# Patient Record
Sex: Female | Born: 1935
Health system: Southern US, Community
[De-identification: ages and names within clinical notes are randomized; demographics above are authoritative.]

## PROBLEM LIST (undated history)

## (undated) DIAGNOSIS — I10 Essential (primary) hypertension: Secondary | ICD-10-CM

## (undated) DIAGNOSIS — R262 Difficulty in walking, not elsewhere classified: Secondary | ICD-10-CM

## (undated) DIAGNOSIS — N189 Chronic kidney disease, unspecified: Secondary | ICD-10-CM

## (undated) DIAGNOSIS — I213 ST elevation (STEMI) myocardial infarction of unspecified site: Secondary | ICD-10-CM

## (undated) DIAGNOSIS — I509 Heart failure, unspecified: Secondary | ICD-10-CM

## (undated) DIAGNOSIS — N3281 Overactive bladder: Secondary | ICD-10-CM

## (undated) DIAGNOSIS — E785 Hyperlipidemia, unspecified: Secondary | ICD-10-CM

## (undated) DIAGNOSIS — E119 Type 2 diabetes mellitus without complications: Secondary | ICD-10-CM

## (undated) HISTORY — PX: TONSILLECTOMY: SUR1361

---

## 2014-05-06 ENCOUNTER — Inpatient Hospital Stay (HOSPITAL_COMMUNITY)
Admission: EM | Admit: 2014-05-06 | Discharge: 2014-05-11 | DRG: 291 | Disposition: A | Discharge status: Skilled Nursing Facility | Payer: Medicare Other | Attending: Internal Medicine | Admitting: Internal Medicine

## 2014-05-06 ENCOUNTER — Encounter (HOSPITAL_COMMUNITY): Payer: Self-pay | Admitting: Emergency Medicine

## 2014-05-06 ENCOUNTER — Emergency Department (HOSPITAL_COMMUNITY): Payer: Medicare Other

## 2014-05-06 DIAGNOSIS — Z7982 Long term (current) use of aspirin: Secondary | ICD-10-CM | POA: Diagnosis not present

## 2014-05-06 DIAGNOSIS — J9601 Acute respiratory failure with hypoxia: Secondary | ICD-10-CM | POA: Diagnosis present

## 2014-05-06 DIAGNOSIS — I5023 Acute on chronic systolic (congestive) heart failure: Secondary | ICD-10-CM | POA: Diagnosis present

## 2014-05-06 DIAGNOSIS — I252 Old myocardial infarction: Secondary | ICD-10-CM

## 2014-05-06 DIAGNOSIS — E785 Hyperlipidemia, unspecified: Secondary | ICD-10-CM | POA: Diagnosis present

## 2014-05-06 DIAGNOSIS — J9600 Acute respiratory failure, unspecified whether with hypoxia or hypercapnia: Secondary | ICD-10-CM | POA: Diagnosis present

## 2014-05-06 DIAGNOSIS — R54 Age-related physical debility: Secondary | ICD-10-CM | POA: Diagnosis present

## 2014-05-06 DIAGNOSIS — D649 Anemia, unspecified: Secondary | ICD-10-CM | POA: Diagnosis present

## 2014-05-06 DIAGNOSIS — D72829 Elevated white blood cell count, unspecified: Secondary | ICD-10-CM

## 2014-05-06 DIAGNOSIS — I480 Paroxysmal atrial fibrillation: Secondary | ICD-10-CM | POA: Diagnosis not present

## 2014-05-06 DIAGNOSIS — I2510 Atherosclerotic heart disease of native coronary artery without angina pectoris: Secondary | ICD-10-CM

## 2014-05-06 DIAGNOSIS — R7989 Other specified abnormal findings of blood chemistry: Secondary | ICD-10-CM | POA: Diagnosis present

## 2014-05-06 DIAGNOSIS — D473 Essential (hemorrhagic) thrombocythemia: Secondary | ICD-10-CM | POA: Diagnosis present

## 2014-05-06 DIAGNOSIS — Z7902 Long term (current) use of antithrombotics/antiplatelets: Secondary | ICD-10-CM | POA: Diagnosis not present

## 2014-05-06 DIAGNOSIS — I959 Hypotension, unspecified: Secondary | ICD-10-CM | POA: Insufficient documentation

## 2014-05-06 DIAGNOSIS — N289 Disorder of kidney and ureter, unspecified: Secondary | ICD-10-CM

## 2014-05-06 DIAGNOSIS — I493 Ventricular premature depolarization: Secondary | ICD-10-CM | POA: Diagnosis present

## 2014-05-06 DIAGNOSIS — I248 Other forms of acute ischemic heart disease: Secondary | ICD-10-CM

## 2014-05-06 DIAGNOSIS — I340 Nonrheumatic mitral (valve) insufficiency: Secondary | ICD-10-CM

## 2014-05-06 DIAGNOSIS — E872 Acidosis: Secondary | ICD-10-CM | POA: Diagnosis present

## 2014-05-06 DIAGNOSIS — I5031 Acute diastolic (congestive) heart failure: Secondary | ICD-10-CM

## 2014-05-06 DIAGNOSIS — I509 Heart failure, unspecified: Secondary | ICD-10-CM | POA: Insufficient documentation

## 2014-05-06 DIAGNOSIS — N3281 Overactive bladder: Secondary | ICD-10-CM | POA: Diagnosis present

## 2014-05-06 DIAGNOSIS — N184 Chronic kidney disease, stage 4 (severe): Secondary | ICD-10-CM

## 2014-05-06 DIAGNOSIS — I2583 Coronary atherosclerosis due to lipid rich plaque: Secondary | ICD-10-CM

## 2014-05-06 DIAGNOSIS — I255 Ischemic cardiomyopathy: Secondary | ICD-10-CM | POA: Diagnosis present

## 2014-05-06 DIAGNOSIS — Z79899 Other long term (current) drug therapy: Secondary | ICD-10-CM | POA: Diagnosis not present

## 2014-05-06 DIAGNOSIS — I130 Hypertensive heart and chronic kidney disease with heart failure and stage 1 through stage 4 chronic kidney disease, or unspecified chronic kidney disease: Secondary | ICD-10-CM | POA: Diagnosis present

## 2014-05-06 DIAGNOSIS — N179 Acute kidney failure, unspecified: Secondary | ICD-10-CM | POA: Diagnosis not present

## 2014-05-06 DIAGNOSIS — Z8249 Family history of ischemic heart disease and other diseases of the circulatory system: Secondary | ICD-10-CM

## 2014-05-06 DIAGNOSIS — E119 Type 2 diabetes mellitus without complications: Secondary | ICD-10-CM | POA: Diagnosis present

## 2014-05-06 DIAGNOSIS — R0600 Dyspnea, unspecified: Secondary | ICD-10-CM | POA: Diagnosis present

## 2014-05-06 DIAGNOSIS — J9602 Acute respiratory failure with hypercapnia: Secondary | ICD-10-CM

## 2014-05-06 HISTORY — DX: Heart failure, unspecified: I50.9

## 2014-05-06 HISTORY — DX: Essential (primary) hypertension: I10

## 2014-05-06 HISTORY — DX: Difficulty in walking, not elsewhere classified: R26.2

## 2014-05-06 HISTORY — DX: Overactive bladder: N32.81

## 2014-05-06 HISTORY — DX: Chronic kidney disease, unspecified: N18.9

## 2014-05-06 HISTORY — DX: Type 2 diabetes mellitus without complications: E11.9

## 2014-05-06 HISTORY — DX: ST elevation (STEMI) myocardial infarction of unspecified site: I21.3

## 2014-05-06 HISTORY — DX: Hyperlipidemia, unspecified: E78.5

## 2014-05-06 LAB — PROTIME-INR
INR: 1.18 (ref 0.00–1.49)
PROTHROMBIN TIME: 15.2 s (ref 11.6–15.2)

## 2014-05-06 LAB — I-STAT ARTERIAL BLOOD GAS, ED
ACID-BASE DEFICIT: 2 mmol/L (ref 0.0–2.0)
Bicarbonate: 25.6 mEq/L — ABNORMAL HIGH (ref 20.0–24.0)
O2 Saturation: 98 %
PO2 ART: 111 mmHg — AB (ref 80.0–100.0)
TCO2: 27 mmol/L (ref 0–100)
pCO2 arterial: 52 mmHg — ABNORMAL HIGH (ref 35.0–45.0)
pH, Arterial: 7.296 — ABNORMAL LOW (ref 7.350–7.450)

## 2014-05-06 LAB — COMPREHENSIVE METABOLIC PANEL
ALT: 127 U/L — ABNORMAL HIGH (ref 0–35)
ANION GAP: 23 — AB (ref 5–15)
AST: 140 U/L — ABNORMAL HIGH (ref 0–37)
Albumin: 3.4 g/dL — ABNORMAL LOW (ref 3.5–5.2)
Alkaline Phosphatase: 55 U/L (ref 39–117)
BUN: 38 mg/dL — ABNORMAL HIGH (ref 6–23)
CALCIUM: 9 mg/dL (ref 8.4–10.5)
CO2: 21 mEq/L (ref 19–32)
CREATININE: 1.98 mg/dL — AB (ref 0.50–1.10)
Chloride: 96 mEq/L (ref 96–112)
GFR calc non Af Amer: 21 mL/min — ABNORMAL LOW (ref >90)
GFR, EST AFRICAN AMERICAN: 25 mL/min — AB (ref >90)
GLUCOSE: 312 mg/dL — AB (ref 70–99)
Potassium: 4.1 mEq/L (ref 3.7–5.3)
SODIUM: 140 meq/L (ref 137–147)
TOTAL PROTEIN: 6.5 g/dL (ref 6.0–8.3)
Total Bilirubin: 1.1 mg/dL (ref 0.3–1.2)

## 2014-05-06 LAB — CBC WITH DIFFERENTIAL/PLATELET
BASOS ABS: 0.1 10*3/uL (ref 0.0–0.1)
BASOS PCT: 0 % (ref 0–1)
EOS ABS: 0.2 10*3/uL (ref 0.0–0.7)
Eosinophils Relative: 1 % (ref 0–5)
HCT: 41.1 % (ref 36.0–46.0)
Hemoglobin: 13.1 g/dL (ref 12.0–15.0)
Lymphocytes Relative: 8 % — ABNORMAL LOW (ref 12–46)
Lymphs Abs: 1.3 10*3/uL (ref 0.7–4.0)
MCH: 29.9 pg (ref 26.0–34.0)
MCHC: 31.9 g/dL (ref 30.0–36.0)
MCV: 93.8 fL (ref 78.0–100.0)
MONO ABS: 0.8 10*3/uL (ref 0.1–1.0)
Monocytes Relative: 5 % (ref 3–12)
NEUTROS ABS: 13.4 10*3/uL — AB (ref 1.7–7.7)
Neutrophils Relative %: 86 % — ABNORMAL HIGH (ref 43–77)
Platelets: 430 10*3/uL — ABNORMAL HIGH (ref 150–400)
RBC: 4.38 MIL/uL (ref 3.87–5.11)
RDW: 14.5 % (ref 11.5–15.5)
WBC: 15.8 10*3/uL — ABNORMAL HIGH (ref 4.0–10.5)

## 2014-05-06 LAB — URINE MICROSCOPIC-ADD ON

## 2014-05-06 LAB — URINALYSIS, ROUTINE W REFLEX MICROSCOPIC
Bilirubin Urine: NEGATIVE
Glucose, UA: NEGATIVE mg/dL
Hgb urine dipstick: NEGATIVE
KETONES UR: NEGATIVE mg/dL
NITRITE: NEGATIVE
PROTEIN: 30 mg/dL — AB
Specific Gravity, Urine: 1.013 (ref 1.005–1.030)
UROBILINOGEN UA: 0.2 mg/dL (ref 0.0–1.0)
pH: 5.5 (ref 5.0–8.0)

## 2014-05-06 LAB — TROPONIN I
TROPONIN I: 0.42 ng/mL — AB (ref <0.30)
Troponin I: <0.3 ng/mL (ref <0.30)
Troponin I: 0.62 ng/mL (ref <0.30)

## 2014-05-06 LAB — PRO B NATRIURETIC PEPTIDE: Pro B Natriuretic peptide (BNP): 41228 pg/mL — ABNORMAL HIGH (ref 0–450)

## 2014-05-06 LAB — LIPASE, BLOOD: LIPASE: 33 U/L (ref 11–59)

## 2014-05-06 LAB — LACTIC ACID, PLASMA: Lactic Acid, Venous: 1.4 mmol/L (ref 0.5–2.2)

## 2014-05-06 LAB — MRSA PCR SCREENING: MRSA BY PCR: NEGATIVE

## 2014-05-06 MED ORDER — HEPARIN SODIUM (PORCINE) 5000 UNIT/ML IJ SOLN
5000.0000 [IU] | Freq: Three times a day (TID) | INTRAMUSCULAR | Status: DC
  Administered 2014-05-06 – 2014-05-08 (×6): 5000 [IU] via SUBCUTANEOUS
  Filled 2014-05-06 (×9): qty 1

## 2014-05-06 MED ORDER — SIMVASTATIN 20 MG PO TABS
20.0000 mg | ORAL_TABLET | Freq: Every day | ORAL | Status: DC
  Administered 2014-05-06 – 2014-05-11 (×6): 20 mg via ORAL
  Filled 2014-05-06 (×6): qty 1

## 2014-05-06 MED ORDER — ZIPRASIDONE MESYLATE 20 MG IM SOLR: 20.0000 mg | Freq: Once | INTRAMUSCULAR | Status: DC

## 2014-05-06 MED ORDER — FUROSEMIDE 10 MG/ML IJ SOLN
80.0000 mg | Freq: Once | INTRAMUSCULAR | Status: AC
  Administered 2014-05-06: 80 mg via INTRAVENOUS

## 2014-05-06 MED ORDER — CLOPIDOGREL BISULFATE 75 MG PO TABS
75.0000 mg | ORAL_TABLET | Freq: Every morning | ORAL | Status: DC
  Administered 2014-05-07 – 2014-05-09 (×3): 75 mg via ORAL
  Filled 2014-05-06 (×4): qty 1

## 2014-05-06 MED ORDER — FUROSEMIDE 10 MG/ML IJ SOLN
80.0000 mg | Freq: Two times a day (BID) | INTRAMUSCULAR | Status: DC
  Administered 2014-05-07: 80 mg via INTRAVENOUS
  Filled 2014-05-06 (×3): qty 8

## 2014-05-06 MED ORDER — ASPIRIN EC 81 MG PO TBEC
81.0000 mg | DELAYED_RELEASE_TABLET | Freq: Every day | ORAL | Status: DC
  Administered 2014-05-07: 81 mg via ORAL
  Filled 2014-05-06 (×2): qty 1

## 2014-05-06 NOTE — ED Notes (Signed)
Pt refused foley catheter at this time. 

## 2014-05-06 NOTE — Progress Notes (Signed)
Pt with Labored breathing on NRB. Abg obtained. Will wean O2 as tolerated.

## 2014-05-06 NOTE — ED Notes (Signed)
Attempted report 

## 2014-05-06 NOTE — ED Notes (Signed)
Paged Dr Raelene Bott to notify of critical troponin.

## 2014-05-06 NOTE — ED Notes (Signed)
Dr Raelene Bott aware of troponin 0.62.

## 2014-05-06 NOTE — Plan of Care (Signed)
Problem: ICU Phase Progression Outcomes Goal: O2 sats trending toward baseline Outcome: Progressing     

## 2014-05-06 NOTE — ED Notes (Addendum)
Pt from [facility] via [place] with co increased SOB and RLQ pain with tenderness and swelling this am after laying back down from the bathroom.  Pt found tripoding, pursed lip breathing, and grunted breathing. 86% SpO2 on RA. 12 lead unremarkable, a-fib with PVCs, hx MI, DM, HTN, CKD, CHF.  Given 2 0.4 nitroglycerin by nursing staff. Dr Eulis Foster at bedside.

## 2014-05-06 NOTE — Consult Note (Signed)
Admit date: 05/06/2014 Referring Physician  Dr. Raelene Bott Primary Physician No primary care provider on file. Primary Cardiologist  [facility] Reason for Consultation  elevated troponin, dyspnea  HPI: 80 year old female with history of heart failure, chronic kidney disease, hypertension, diabetes who per family had myocardial infarction in October of this year, 2 months ago and was evaluated at [facility] [place]. She did not have a cardiac catheterization. She remembers the discussion that her kidneys would not likely function well after this procedure. Is on both Plavix and aspirin, optimized medical management in the setting of non-STEMI.  She arrived to the emergency room with persistent dyspnea at night, worsening with increased work of breathing, no significant cough. She was also noting that her stomach was in pain, right side of her abdomen.  In the emergency room, nonrebreather mask was used at first, now nasal cannula. Pulmonary edema on chest x-ray. Lasix 80 mg IV given (does not appear to have good urine output). ProBNP was 41,000, white count slightly elevated at 16, creatinine of 1.9. She was also demonstrating a mild respiratory acidosis with PCO2 of 52, pH of 7.29. CO2 was 111.    PMH:   Past Medical History  Diagnosis Date  . CKD (chronic kidney disease)   . CHF (congestive heart failure)   . STEMI (ST elevation myocardial infarction)   . Hypertension   . Diabetes mellitus without complication   . Difficulty walking   . Overactive bladder   . Hyperlipidemia     PSH:   Past Surgical History  Procedure Laterality Date  . Tonsillectomy     Allergies:  Lipitor and Sulfa antibiotics Prior to Admit Meds:   Prior to Admission medications   Medication Sig Start Date End Date Taking? Authorizing Provider  aspirin EC 81 MG tablet Take 81 mg by mouth daily.   Yes Historical Provider, MD  calcium carbonate (TUMS - DOSED IN MG ELEMENTAL CALCIUM) 500 MG chewable tablet Chew  1 tablet by mouth 3 (three) times daily as needed for indigestion or heartburn.   Yes Historical Provider, MD  calcium-vitamin D (OSCAL) 250-125 MG-UNIT per tablet Take 1 tablet by mouth daily.   Yes Historical Provider, MD  cholecalciferol (VITAMIN D) 400 UNITS TABS tablet Take 400 Units by mouth daily.   Yes Historical Provider, MD  clopidogrel (PLAVIX) 75 MG tablet Take 75 mg by mouth every morning.   Yes Historical Provider, MD  cyanocobalamin (,VITAMIN B-12,) 1000 MCG/ML injection Inject 1,000 mcg into the muscle every 30 (thirty) days.   Yes Historical Provider, MD  furosemide (LASIX) 40 MG tablet Take 40-60 mg by mouth 2 (two) times daily. 60mg  every morning and 40mg  every evening   Yes Historical Provider, MD  hydrALAZINE (APRESOLINE) 10 MG tablet Take 5 mg by mouth 3 (three) times daily.   Yes Historical Provider, MD  isosorbide dinitrate (ISORDIL) 5 MG tablet Take 5 mg by mouth 3 (three) times daily.   Yes Historical Provider, MD  metoprolol succinate (TOPROL-XL) 25 MG 24 hr tablet Take 25 mg by mouth daily.   Yes Historical Provider, MD  nitroGLYCERIN (NITROSTAT) 0.4 MG SL tablet Place 0.4 mg under the tongue every 5 (five) minutes as needed for chest pain.   Yes Historical Provider, MD  omeprazole (PRILOSEC) 20 MG capsule Take 20 mg by mouth 2 (two) times daily.   Yes Historical Provider, MD  oxybutynin (DITROPAN-XL) 10 MG 24 hr tablet Take 10 mg by mouth every morning.   Yes  Historical Provider, MD  polyethylene glycol (MIRALAX / GLYCOLAX) packet Take 17 g by mouth daily.   Yes Historical Provider, MD  simvastatin (ZOCOR) 20 MG tablet Take 20 mg by mouth daily.   Yes Historical Provider, MD  vitamin E 400 UNIT capsule Take 400 Units by mouth daily.   Yes Historical Provider, MD   Fam HX:   History reviewed. No pertinent family history. her mother had heart failure and died in her 29s.  Social HX:    History   Social History  . Marital Status: Married    Spouse Name: N/A    Number  of Children: N/A  . Years of Education: N/A   Occupational History  . Not on file.   Social History Main Topics  . Smoking status: Never Smoker   . Smokeless tobacco: Never Used  . Alcohol Use: No  . Drug Use: No  . Sexual Activity: Not on file   Other Topics Concern  . Not on file   Social History Narrative  . No narrative on file     ROS:  All 11 ROS were addressed and are negative except what is stated in the HPI   Physical Exam: Blood pressure 97/61, pulse 74, resp. rate 21, SpO2 96 %.   General: Elderly, mildly uncomfortable in no acute distress Head: Eyes PERRLA, No xanthomas.   Normal cephalic and atramatic  Lungs:   Mild respiratory noise bilaterally. Normal respiratory effort. No wheezes, no rales. Nasal cannula oxygen Heart:   HRRR S1 S2 Pulses are 2+ & equal. Soft systolic right upper sternal border murmur, no rubs, gallops.  No carotid bruit. No significant JVD appreciated.  No abdominal bruits.  Abdomen: Bowel sounds are positive, abdomen soft and non-tender without masses. No hepatosplenomegaly. Mildly protuberant Msk:  Back normal.  Appears weakened Extremities:  No clubbing, cyanosis or edema.  DP +1 Neuro: Alert and oriented X 3, non-focal, MAE x 4 GU: Deferred Rectal: Deferred Psych:  Good affect, responds appropriately      Labs: Lab Results  Component Value Date   WBC 15.8* 05/06/2014   HGB 13.1 05/06/2014   HCT 41.1 05/06/2014   MCV 93.8 05/06/2014   PLT 430* 05/06/2014     Recent Labs Lab 05/06/14 0840  NA 140  K 4.1  CL 96  CO2 21  BUN 38*  CREATININE 1.98*  CALCIUM 9.0  PROT 6.5  BILITOT 1.1  ALKPHOS 55  ALT 127*  AST 140*  GLUCOSE 312*    Recent Labs  05/06/14 0840 05/06/14 1346  TROPONINI <0.30 0.62*   ProBNP 41,000    Radiology:  Dg Chest Port 1 View  05/06/2014   CLINICAL DATA:  Respiratory distress. Abdominal pain. Initial encounter.  EXAM: PORTABLE CHEST - 1 VIEW  COMPARISON:  04/07/2014 and 03/21/2014  radiographs.  FINDINGS: 0821 hr. There are new moderate-sized pleural effusions with associated bibasilar atelectasis and mild pulmonary edema. Cardiomegaly appears grossly stable. There is stable atherosclerosis of the thoracic aorta. No pneumothorax or acute osseous findings demonstrated. There are probable loose bodies in the right shoulder suprascapular recess.  IMPRESSION: Cardiomegaly, pulmonary edema and moderate bilateral pleural effusions consistent with congestive heart failure.   Electronically Signed   By: Camie Patience M.D.   On: 05/06/2014 08:37    EKG:  Sinus rhythm with PVCs Personally viewed.   ASSESSMENT/PLAN:   80 year old female with recent myocardial infarction at [facility] in October 2015 on both Plavix and aspirin, unknown Cardiologic  studies, with acute respiratory distress, respiratory acidosis, chronic kidney disease stage III, elevated LFTs, highly elevated BNP suggestive of acute heart failure, either diastolic or systolic with mildly elevated troponin of 0.62, original troponin normal suggestive of demand ischemia in the setting of heart failure.  1. Acute heart failure - Echocardiogram-delineate between systolic or diastolic - Agree with IV Lasix, consider 80 mg twice a day with close monitoring of creatinine. - Pulmonary status has improved since diuresis (although meager output noted)  2. Demand ischemia - Agree with assessment that her mildly elevated troponin is likely secondary to demand ischemia, supply demand mismatch in the setting of acute heart failure/respiratory failure. Troponin rises significantly or if symptoms of angina develop we will consider possible further cardiac testing. - She states that she did not have a cardiac catheterization at [facility], records pending, I would not push further invasive testing at this point. I agree with her that there is potential for acute kidney injury. Continue to treat medically.  Aspirin, Plavix, beta blocker, isosorbide, statin. I do not feel strongly that IV heparin be initiated in the setting given that this is not likely acute plaque rupture.  3. Elevated LFTs - May be secondary to right-sided heart failure, hepatic congestion.  4. Elevated platelets, leukocytosis  - In part, acute phase reactants - Per primary team   Candee Furbish, MD  05/06/2014  4:28 PM

## 2014-05-06 NOTE — ED Provider Notes (Signed)
CSN: [id number]     Arrival date & time 05/06/14  0804 History   None    Chief Complaint  Patient presents with  . Respiratory Distress  . Abdominal Pain     (Consider location/radiation/quality/duration/timing/severity/associated sxs/prior Treatment) HPI   Leah Kramer is a 80 y.o. female who presents for evaluation of shortness of breath.  Shortness of breath has been ongoing for several weeks.  She lives in a nursing home.  She has apparently required use of oxygen in the last several days.  She also complains of abdominal pain, it is not clear when this started.  History obtained primarily from patient's daughter, and her husband.  Level V Caveat- confusion    Past Medical History  Diagnosis Date  . CKD (chronic kidney disease)   . CHF (congestive heart failure)   . STEMI (ST elevation myocardial infarction)   . Hypertension   . Diabetes mellitus without complication   . Difficulty walking   . Overactive bladder   . Hyperlipidemia    Past Surgical History  Procedure Laterality Date  . Tonsillectomy     History reviewed. No pertinent family history. History  Substance Use Topics  . Smoking status: Never Smoker   . Smokeless tobacco: Never Used  . Alcohol Use: No   OB History    No data available     Review of Systems  Unable to perform ROS     Allergies  Lipitor and Sulfa antibiotics  Home Medications   Prior to Admission medications   Medication Sig Start Date End Date Taking? Authorizing Provider  aspirin EC 81 MG tablet Take 81 mg by mouth daily.   Yes Historical Provider, MD  calcium carbonate (TUMS - DOSED IN MG ELEMENTAL CALCIUM) 500 MG chewable tablet Chew 1 tablet by mouth 3 (three) times daily as needed for indigestion or heartburn.   Yes Historical Provider, MD  calcium-vitamin D (OSCAL) 250-125 MG-UNIT per tablet Take 1 tablet by mouth daily.   Yes Historical Provider, MD  cholecalciferol (VITAMIN D) 400 UNITS TABS tablet Take 400 Units by  mouth daily.   Yes Historical Provider, MD  clopidogrel (PLAVIX) 75 MG tablet Take 75 mg by mouth every morning.   Yes Historical Provider, MD  cyanocobalamin (,VITAMIN B-12,) 1000 MCG/ML injection Inject 1,000 mcg into the muscle every 30 (thirty) days.   Yes Historical Provider, MD  furosemide (LASIX) 40 MG tablet Take 40-60 mg by mouth 2 (two) times daily. 60mg  every morning and 40mg  every evening   Yes Historical Provider, MD  hydrALAZINE (APRESOLINE) 10 MG tablet Take 5 mg by mouth 3 (three) times daily.   Yes Historical Provider, MD  isosorbide dinitrate (ISORDIL) 5 MG tablet Take 5 mg by mouth 3 (three) times daily.   Yes Historical Provider, MD  metoprolol succinate (TOPROL-XL) 25 MG 24 hr tablet Take 25 mg by mouth daily.   Yes Historical Provider, MD  nitroGLYCERIN (NITROSTAT) 0.4 MG SL tablet Place 0.4 mg under the tongue every 5 (five) minutes as needed for chest pain.   Yes Historical Provider, MD  omeprazole (PRILOSEC) 20 MG capsule Take 20 mg by mouth 2 (two) times daily.   Yes Historical Provider, MD  oxybutynin (DITROPAN-XL) 10 MG 24 hr tablet Take 10 mg by mouth every morning.   Yes Historical Provider, MD  polyethylene glycol (MIRALAX / GLYCOLAX) packet Take 17 g by mouth daily.   Yes Historical Provider, MD  simvastatin (ZOCOR) 20 MG tablet Take 20 mg by mouth  daily.   Yes Historical Provider, MD  vitamin E 400 UNIT capsule Take 400 Units by mouth daily.   Yes Historical Provider, MD   BP 94/48 mmHg  Pulse 66  Resp 17  SpO2 95% Physical Exam  Constitutional: She appears well-developed.  Elderly, frail  HENT:  Head: Normocephalic and atraumatic.  Right Ear: External ear normal.  Left Ear: External ear normal.  Eyes: Conjunctivae and EOM are normal. Pupils are equal, round, and reactive to light.  Neck: Normal range of motion and phonation normal. Neck supple.  Cardiovascular: Normal rate, regular rhythm and normal heart sounds.   Decreased capillary refill and some mild  cyanosis of the toes bilaterally.  Pulmonary/Chest: Effort normal and breath sounds normal. She exhibits no bony tenderness.  Abdominal: Soft. She exhibits distension. There is tenderness (diffuse, mild). There is no rebound.  Musculoskeletal: Normal range of motion. She exhibits no edema or tenderness.  Neurological: She is alert. No cranial nerve deficit or sensory deficit. She exhibits normal muscle tone. Coordination normal.  She is alert and conversant, she is not dysarthric.  Skin: Skin is warm, dry and intact.  Psychiatric: She has a normal mood and affect. Her behavior is normal.  Nursing note and vitals reviewed.   ED Course  Procedures (including critical care time)  Initially, oxygen saturation is normal on the face mask oxygen at 100%.  09:05- arterial blood gas reviewed, is consistent with acute respiratory failure.  Chest x-ray shows congestive heart failure.  She will be treated with IV Lasix, Foley catheterization, and close observation.   Discussed with [facility] resident- will admit as unassigned patient.   Medications  aspirin EC tablet 81 mg (not administered)  clopidogrel (PLAVIX) tablet 75 mg (not administered)  heparin injection 5,000 Units (5,000 Units Subcutaneous Given 05/06/14 1531)  furosemide (LASIX) injection 80 mg (80 mg Intravenous Given 05/06/14 0922)    Patient Vitals for the past 24 hrs:  BP Pulse Resp SpO2  05/06/14 1803 - 66 17 95 %  05/06/14 1802 - 68 20 95 %  05/06/14 1801 (!) 94/48 mmHg 71 (!) 21 (!) 89 %  05/06/14 1619 97/61 mmHg 74 21 96 %  05/06/14 1530 110/60 mmHg 77 17 92 %  05/06/14 1510 103/55 mmHg 76 21 94 %  05/06/14 1500 103/67 mmHg 78 23 94 %  05/06/14 1450 110/62 mmHg 80 23 95 %  05/06/14 1440 95/59 mmHg 72 19 (!) 89 %  05/06/14 1430 113/59 mmHg 76 22 94 %  05/06/14 1420 107/58 mmHg 78 20 95 %  05/06/14 1416 - - - 93 %  05/06/14 1410 106/55 mmHg 78 22 95 %  05/06/14 1400 (!) 96/53 mmHg 71 20 96 %  05/06/14 1350 (!) 99/51 mmHg 74  18 94 %  05/06/14 1340 102/59 mmHg 72 19 94 %  05/06/14 1330 96/66 mmHg 75 19 94 %  05/06/14 1320 (!) 88/49 mmHg 66 15 94 %  05/06/14 1310 (!) 87/45 mmHg 65 22 95 %  05/06/14 1300 (!) 84/49 mmHg 69 22 94 %  05/06/14 1250 (!) 82/47 mmHg 68 22 94 %  05/06/14 1240 (!) 85/46 mmHg 68 22 93 %  05/06/14 1230 (!) 85/50 mmHg 70 21 93 %  05/06/14 1220 (!) 88/46 mmHg 74 21 92 %  05/06/14 1210 (!) 91/51 mmHg 72 22 94 %  05/06/14 1200 (!) 89/53 mmHg 81 25 92 %  05/06/14 1150 (!) 87/48 mmHg 72 22 90 %  05/06/14 1145 - - -  92 %  05/06/14 1140 (!) 91/46 mmHg 75 22 90 %  05/06/14 1135 - - - (!) 88 %  05/06/14 1130 (!) 89/51 mmHg 78 21 91 %  05/06/14 1125 - - - (!) 85 %  05/06/14 1120 (!) 84/48 mmHg 75 23 (!) 86 %  05/06/14 1115 - - - (!) 86 %  05/06/14 1110 (!) 87/51 mmHg 76 22 (!) 85 %  05/06/14 1100 (!) 92/54 mmHg 81 23 90 %  05/06/14 1050 (!) 89/54 mmHg 81 23 91 %  05/06/14 1047 (!) 93/51 mmHg 83 (!) 27 92 %  05/06/14 1041 (!) 84/52 mmHg 85 23 92 %  05/06/14 1030 (!) 81/50 mmHg 79 23 92 %  05/06/14 1000 101/60 mmHg 92 (!) 32 92 %  05/06/14 0930 102/66 mmHg 76 (!) 28 93 %  05/06/14 0900 129/91 mmHg 119 (!) 38 98 %  05/06/14 0830 137/85 mmHg 109 (!) 37 97 %  05/06/14 0815 123/85 mmHg 104 (!) 34 96 %  05/06/14 0812 - - - 97 %  05/06/14 0810 136/79 mmHg 106 (!) 37 97 %      CRITICAL CARE Performed by: Daleen Bo L Total critical care time: 50 minutes Critical care time was exclusive of separately billable procedures and treating other patients. Critical care was necessary to treat or prevent imminent or life-threatening deterioration. Critical care was time spent personally by me on the following activities: development of treatment plan with patient and/or surrogate as well as nursing, discussions with consultants, evaluation of patient's response to treatment, examination of patient, obtaining history from patient or surrogate, ordering and performing treatments and interventions,  ordering and review of laboratory studies, ordering and review of radiographic studies, pulse oximetry and re-evaluation of patient's condition.   Labs Review Labs Reviewed  CBC WITH DIFFERENTIAL - Abnormal; Notable for the following:    WBC 15.8 (*)    Platelets 430 (*)    Neutrophils Relative % 86 (*)    Neutro Abs 13.4 (*)    Lymphocytes Relative 8 (*)    All other components within normal limits  PRO B NATRIURETIC PEPTIDE - Abnormal; Notable for the following:    Pro B Natriuretic peptide (BNP) 41228.0 (*)    All other components within normal limits  COMPREHENSIVE METABOLIC PANEL - Abnormal; Notable for the following:    Glucose, Bld 312 (*)    BUN 38 (*)    Creatinine, Ser 1.98 (*)    Albumin 3.4 (*)    AST 140 (*)    ALT 127 (*)    GFR calc non Af Amer 21 (*)    GFR calc Af Amer 25 (*)    Anion gap 23 (*)    All other components within normal limits  URINALYSIS, ROUTINE W REFLEX MICROSCOPIC - Abnormal; Notable for the following:    APPearance CLOUDY (*)    Protein, ur 30 (*)    Leukocytes, UA TRACE (*)    All other components within normal limits  URINE MICROSCOPIC-ADD ON - Abnormal; Notable for the following:    Casts HYALINE CASTS (*)    All other components within normal limits  TROPONIN I - Abnormal; Notable for the following:    Troponin I 0.62 (*)    All other components within normal limits  I-STAT ARTERIAL BLOOD GAS, ED - Abnormal; Notable for the following:    pH, Arterial 7.296 (*)    pCO2 arterial 52.0 (*)    pO2, Arterial 111.0 (*)  Bicarbonate 25.6 (*)    All other components within normal limits  MRSA PCR SCREENING  TROPONIN I  LIPASE, BLOOD  PROTIME-INR  LACTIC ACID, PLASMA  TROPONIN I  TROPONIN I    Imaging Review Dg Chest Port 1 View  05/06/2014   CLINICAL DATA:  Respiratory distress. Abdominal pain. Initial encounter.  EXAM: PORTABLE CHEST - 1 VIEW  COMPARISON:  04/07/2014 and 03/21/2014 radiographs.  FINDINGS: 0821 hr. There are new  moderate-sized pleural effusions with associated bibasilar atelectasis and mild pulmonary edema. Cardiomegaly appears grossly stable. There is stable atherosclerosis of the thoracic aorta. No pneumothorax or acute osseous findings demonstrated. There are probable loose bodies in the right shoulder suprascapular recess.  IMPRESSION: Cardiomegaly, pulmonary edema and moderate bilateral pleural effusions consistent with congestive heart failure.   Electronically Signed   By: Camie Patience M.D.   On: 05/06/2014 08:37     EKG Interpretation None         Date: 05/06/14- 08:09  Rate: 102  Rhythm: indeterminate  QRS Axis: normal  PR and QT Intervals: ideterminate  ST/T Wave abnormalities: nonspecific ST/T changes  PR and QRS Conduction Disutrbances:indeterminate  Narrative Interpretation:   Old EKG Reviewed: none available     Date: 05/06/14- 09:40  Rate: 90  Rhythm: indeterminate  QRS Axis: left  PR and QT Intervals: indeterminate  ST/T Wave abnormalities: normal  PR and QRS Conduction Disutrbances: indeterminate  Narrative Interpretation: similar to earlier  Old EKG Reviewed: none available     MDM   Final diagnoses:  Acute on chronic congestive heart failure, unspecified congestive heart failure type  Renal insufficiency  Leukocytosis  Hypotension, unspecified hypotension type    Nonspecific CHF, with fluid overload, and hypoxia. Her BP is soft, preventing aggressive treatment for CHF. She will need admission for further treatment and evaluation.  Nursing Notes Reviewed/ Care Coordinated Applicable Imaging Reviewed Interpretation of Laboratory Data incorporated into ED treatment   Plan: admit    Richarda Blade, MD 05/06/14 [id number]

## 2014-05-06 NOTE — ED Notes (Addendum)
Pt's daughter inquires if pt can eat, made aware that admitting Dr. ordered NPO.

## 2014-05-06 NOTE — ED Notes (Signed)
Paged Dr Lynnae January to notify of critical troponin 0.62.

## 2014-05-06 NOTE — Progress Notes (Signed)
Placed pt on 5L [place]. Rt will continue to monitor.

## 2014-05-06 NOTE — H&P (Signed)
Date: 05/06/2014               Patient Name:  Leah Kramer MRN: [id number]  DOB: Dec 17, 1923 Age / Sex: 80 y.o., female   PCP: No primary care provider on file.         Medical Service: Internal Medicine Teaching Service         Attending Physician: Dr. Bartholomew Crews, MD    First Contact: Dr. Raelene Bott Pager: [phone]  Second Contact: Dr. Redmond Pulling Pager: [phone]       After Hours (After 5p/  First Contact Pager: [phone]  weekends / holidays): Second Contact Pager: [phone]   Chief Complaint: Dyspnea  History of Present Illness:   Patient is an 80 year old female with a history of chronic kidney disease, congestive heart failure, hypertension, diabetes, overactive bladder, and hyperlipidemia who presents with several week history of dyspnea. Patient was somnolent exam so most of history provided by the family. Family states that patient had a myocardial infarction in October of this year and was evaluated at [facility]. There do not seem to be any notes available through care everywhere in the chart. Since then, patient has been in rehabilitation and has been improving, being able to ambulate with a walker at baseline. However, patient has had persistent dyspnea at night with increased requirement for elevation during sleep to maintain her breathing. Last night, patient had much worse dyspnea and increased work of breathing. No significant productive cough. Patient had also reportedly reported that her stomach hurt and was holding The right side of her abdomen.  Upon evaluation in the emergency department, family reports that patient required a nonrebreather mask initially and later settled down on nasal cannula 6 L. Patient was found to have pulmonary edema on chest x-ray and given 80 mg of Lasix IV. Since then, family reports that patient has not had any significant urine output. She has a family history of mother with heart failure and dying in her 39s. Otherwise, patient denies any tobacco,  alcohol, or illicit drug use.  Meds: Current Facility-Administered Medications  Medication Dose Route Frequency Provider Last Rate Last Dose  . [START ON 05/07/2014] aspirin EC tablet 81 mg  81 mg Oral Daily Francesca Oman, DO      . [START ON 05/07/2014] clopidogrel (PLAVIX) tablet 75 mg  75 mg Oral q morning - 10a Francesca Oman, DO      . heparin injection 5,000 Units  5,000 Units Subcutaneous 3 times per day Francesca Oman, DO   5,000 Units at 05/06/14 1531   Current Outpatient Prescriptions  Medication Sig Dispense Refill  . aspirin EC 81 MG tablet Take 81 mg by mouth daily.    . calcium carbonate (TUMS - DOSED IN MG ELEMENTAL CALCIUM) 500 MG chewable tablet Chew 1 tablet by mouth 3 (three) times daily as needed for indigestion or heartburn.    . calcium-vitamin D (OSCAL) 250-125 MG-UNIT per tablet Take 1 tablet by mouth daily.    . cholecalciferol (VITAMIN D) 400 UNITS TABS tablet Take 400 Units by mouth daily.    . clopidogrel (PLAVIX) 75 MG tablet Take 75 mg by mouth every morning.    . cyanocobalamin (,VITAMIN B-12,) 1000 MCG/ML injection Inject 1,000 mcg into the muscle every 30 (thirty) days.    . furosemide (LASIX) 40 MG tablet Take 40-60 mg by mouth 2 (two) times daily. 60mg  every morning and 40mg  every evening    . hydrALAZINE (APRESOLINE) 10 MG tablet Take 5  mg by mouth 3 (three) times daily.    . isosorbide dinitrate (ISORDIL) 5 MG tablet Take 5 mg by mouth 3 (three) times daily.    . metoprolol succinate (TOPROL-XL) 25 MG 24 hr tablet Take 25 mg by mouth daily.    . nitroGLYCERIN (NITROSTAT) 0.4 MG SL tablet Place 0.4 mg under the tongue every 5 (five) minutes as needed for chest pain.    Marland Kitchen omeprazole (PRILOSEC) 20 MG capsule Take 20 mg by mouth 2 (two) times daily.    Marland Kitchen oxybutynin (DITROPAN-XL) 10 MG 24 hr tablet Take 10 mg by mouth every morning.    . polyethylene glycol (MIRALAX / GLYCOLAX) packet Take 17 g by mouth daily.    . simvastatin (ZOCOR) 20 MG tablet Take 20 mg by  mouth daily.    . vitamin E 400 UNIT capsule Take 400 Units by mouth daily.      Past Medical History  Diagnosis Date  . CKD (chronic kidney disease)   . CHF (congestive heart failure)   . STEMI (ST elevation myocardial infarction)   . Hypertension   . Diabetes mellitus without complication   . Difficulty walking   . Overactive bladder   . Hyperlipidemia    Past Surgical History  Procedure Laterality Date  . Tonsillectomy      Allergies: Allergies as of 05/06/2014 - Review Complete 05/06/2014  Allergen Reaction Noted  . Lipitor [atorvastatin] Other (See Comments) 05/06/2014  . Sulfa antibiotics Other (See Comments) 05/06/2014   History reviewed. No pertinent family history. History   Social History  . Marital Status: Married    Spouse Name: N/A    Number of Children: N/A  . Years of Education: N/A   Occupational History  . Not on file.   Social History Main Topics  . Smoking status: Never Smoker   . Smokeless tobacco: Never Used  . Alcohol Use: No  . Drug Use: No  . Sexual Activity: Not on file   Other Topics Concern  . Not on file   Social History Narrative  . No narrative on file    Review of Systems: All pertinent ROS has stated in HPI.   Physical Exam: Blood pressure 103/55, pulse 76, resp. rate 21, SpO2 94 %. General: resting in bed HEENT: PERRL, EOMI, no scleral icterus, nasal cannula in place, somnolent Cardiac: RRR, 2/6 systolic murmur, no rubs, murmurs or gallops Pulm: Bibasilar Rales Abd: soft, nontender, nondistended, BS present Ext: warm and well perfused, no pedal edema Neuro: alert and oriented X3, somnolent, cranial nerves II-XII grossly intact Skin: no rashes or lesions noted Psych: appropriate affect  Lab results: Basic Metabolic Panel:  Recent Labs  05/06/14 0840  NA 140  K 4.1  CL 96  CO2 21  GLUCOSE 312*  BUN 38*  CREATININE 1.98*  CALCIUM 9.0   Liver Function Tests:  Recent Labs  05/06/14 0840  AST 140*  ALT  127*  ALKPHOS 55  BILITOT 1.1  PROT 6.5  ALBUMIN 3.4*    Recent Labs  05/06/14 0840  LIPASE 33   No results for input(s): AMMONIA in the last 72 hours. CBC:  Recent Labs  05/06/14 0840  WBC 15.8*  NEUTROABS 13.4*  HGB 13.1  HCT 41.1  MCV 93.8  PLT 430*   Cardiac Enzymes:  Recent Labs  05/06/14 0840 05/06/14 1346  TROPONINI <0.30 0.62*   BNP:  Recent Labs  05/06/14 0840  PROBNP 41228.0*   D-Dimer: No results for input(s): DDIMER in  the last 72 hours. CBG: No results for input(s): GLUCAP in the last 72 hours. Hemoglobin A1C: No results for input(s): HGBA1C in the last 72 hours. Fasting Lipid Panel: No results for input(s): CHOL, HDL, LDLCALC, TRIG, CHOLHDL, LDLDIRECT in the last 72 hours. Thyroid Function Tests: No results for input(s): TSH, T4TOTAL, FREET4, T3FREE, THYROIDAB in the last 72 hours. Anemia Panel: No results for input(s): VITAMINB12, FOLATE, FERRITIN, TIBC, IRON, RETICCTPCT in the last 72 hours. Coagulation:  Recent Labs  05/06/14 0840  LABPROT 15.2  INR 1.18   Urine Drug Screen: Drugs of Abuse  No results found for: LABOPIA, COCAINSCRNUR, LABBENZ, AMPHETMU, THCU, LABBARB  Alcohol Level: No results for input(s): ETH in the last 72 hours. Urinalysis:  Recent Labs  05/06/14 1010  COLORURINE YELLOW  LABSPEC 1.013  PHURINE 5.5  GLUCOSEU NEGATIVE  HGBUR NEGATIVE  BILIRUBINUR NEGATIVE  KETONESUR NEGATIVE  PROTEINUR 30*  UROBILINOGEN 0.2  NITRITE NEGATIVE  LEUKOCYTESUR TRACE*   Imaging results:  Dg Chest Port 1 View  05/06/2014   CLINICAL DATA:  Respiratory distress. Abdominal pain. Initial encounter.  EXAM: PORTABLE CHEST - 1 VIEW  COMPARISON:  04/07/2014 and 03/21/2014 radiographs.  FINDINGS: 0821 hr. There are new moderate-sized pleural effusions with associated bibasilar atelectasis and mild pulmonary edema. Cardiomegaly appears grossly stable. There is stable atherosclerosis of the thoracic aorta. No pneumothorax or acute  osseous findings demonstrated. There are probable loose bodies in the right shoulder suprascapular recess.  IMPRESSION: Cardiomegaly, pulmonary edema and moderate bilateral pleural effusions consistent with congestive heart failure.   Electronically Signed   By: Camie Patience M.D.   On: 05/06/2014 08:37    Other results: EKG: Sinus rhythm, PVCs, no priors for comparison Assessment & Plan by Problem: Active Problems:   Acute respiratory failure  Patient is an 80 year old female with a history of chronic kidney disease, congestive heart failure, hypertension, diabetes, overactive bladder, and hyperlipidemia who is admitted for acute decompensated heart failure.  Acute decompensated congestive heart failure: Patient has ischemic cardiomyopathy from a recent MI in October 2015. Patient did not receive a cardiac catheterization per patient because of concern of kidney function. Patient states a several week history of shortness of breath, requiring use of oxygen over the last several days. Patient's chest x-ray with pulmonary edema and bilateral pleural effusions consistent with acute congestive heart failure. ProBNP elevated at 41,000 with no priors for comparison in the system. Troponins are trending up (see below). Initial ABG showing acute respiratory acidosis though lactate within normal limits at 1.4. -Status post 80 mg IV Lasix in the emergency department. Will continue to monitor patient's status given hypotension (see below) -Lactic acid pending -Daily weights -Strict I's and O's  Elevated troponin: Troponin trending up to 0.62. Patient with recent history of MI. Could be demand ischemia in the setting of acute illness. -Continue to trend -Consider consulting cardiology  Coronary artery disease: Patient status post MI in October 2015, managed at [facility]. No records on file through care everywhere. -Continue 10 units aspirin 81 mg and Plavix 75 mg daily -Will obtain records  Hypotension:  Patient initially in the 782U systolic upon initial evaluation. Blood pressures have steadily decline now in the 80s over 50s status post 80 mg of IV Lasix. -Will be careful with fluid resuscitation pulmonary edema. May need support with pressors should this continue to be a problem. Family states that patient would be okay with this according to goals of care. - Will hold anti-hypertensives at this point.  Abdominal pain: Lipase within normal limits. Urinalysis without evidence of UTI. Upon interview, patient no longer reporting any symptoms.  Possible AKI: Creatinine of 1.98 with no priors in the system. Concurrent rise in BUN suggesting prerenal etiology. -Will continue to monitor.  Elevated liver enzymes: AST of 140 and ALT of 127 with no priors in the system. Hepatocellular pattern with no rise in alkaline phosphatase or bilirubin.  -We will repeat labs in the morning  Anion gap acidosis: Anion gap of 23 with a bicarbonate of 21. ABG showing acute respiratory acidosis. Lactate within normal limits. Low suspicion for ingestion given patient was at facility. Patient does not have a history of diabetes or alcohol use. -Continue to monitor  Diet: No diet ordered for now pending bedside swallow when patient is hungry Prophylaxis: heparin sq and SCDs Code: Full (confirmed with family that this is the current status, despite DO NOT RESUSCITATE records in the file)  Dispo: Disposition is deferred at this time, awaiting improvement of current medical problems. Anticipated discharge in approximately 3 day(s).   The patient does have a current PCP (No primary care provider on file.) and does need an [facility] hospital follow-up appointment after discharge.  The patient does have transportation limitations that hinder transportation to clinic appointments.  Signed: Luan Moore, M.D., Ph.D. Internal Medicine Teaching Service, PGY-1 05/06/2014, 3:55 PM

## 2014-05-06 NOTE — ED Notes (Addendum)
Notified Dr Eulis Foster pt's systolic BP in the 09Z.  Reclined pt as much as tolerated due to respiratory distress.

## 2014-05-07 ENCOUNTER — Inpatient Hospital Stay (HOSPITAL_COMMUNITY): Payer: Medicare Other

## 2014-05-07 DIAGNOSIS — R945 Abnormal results of liver function studies: Secondary | ICD-10-CM

## 2014-05-07 DIAGNOSIS — I359 Nonrheumatic aortic valve disorder, unspecified: Secondary | ICD-10-CM

## 2014-05-07 DIAGNOSIS — N178 Other acute kidney failure: Secondary | ICD-10-CM

## 2014-05-07 DIAGNOSIS — I959 Hypotension, unspecified: Secondary | ICD-10-CM

## 2014-05-07 DIAGNOSIS — J9601 Acute respiratory failure with hypoxia: Secondary | ICD-10-CM

## 2014-05-07 DIAGNOSIS — I2583 Coronary atherosclerosis due to lipid rich plaque: Secondary | ICD-10-CM

## 2014-05-07 DIAGNOSIS — I2510 Atherosclerotic heart disease of native coronary artery without angina pectoris: Secondary | ICD-10-CM

## 2014-05-07 DIAGNOSIS — J9600 Acute respiratory failure, unspecified whether with hypoxia or hypercapnia: Secondary | ICD-10-CM

## 2014-05-07 DIAGNOSIS — N184 Chronic kidney disease, stage 4 (severe): Secondary | ICD-10-CM

## 2014-05-07 DIAGNOSIS — I340 Nonrheumatic mitral (valve) insufficiency: Secondary | ICD-10-CM

## 2014-05-07 DIAGNOSIS — I5023 Acute on chronic systolic (congestive) heart failure: Secondary | ICD-10-CM

## 2014-05-07 LAB — CARBOXYHEMOGLOBIN
Carboxyhemoglobin: 0.9 % (ref 0.5–1.5)
METHEMOGLOBIN: 0.9 % (ref 0.0–1.5)
O2 SAT: 57.3 %
Total hemoglobin: 13 g/dL (ref 12.0–16.0)

## 2014-05-07 LAB — BASIC METABOLIC PANEL
ANION GAP: 13 (ref 5–15)
Anion gap: 17 — ABNORMAL HIGH (ref 5–15)
BUN: 39 mg/dL — ABNORMAL HIGH (ref 6–23)
BUN: 40 mg/dL — ABNORMAL HIGH (ref 6–23)
CHLORIDE: 100 meq/L (ref 96–112)
CHLORIDE: 97 meq/L (ref 96–112)
CO2: 25 mEq/L (ref 19–32)
CO2: 30 mEq/L (ref 19–32)
CREATININE: 1.99 mg/dL — AB (ref 0.50–1.10)
Calcium: 8.5 mg/dL (ref 8.4–10.5)
Calcium: 8.6 mg/dL (ref 8.4–10.5)
Creatinine, Ser: 2.03 mg/dL — ABNORMAL HIGH (ref 0.50–1.10)
GFR calc Af Amer: 24 mL/min — ABNORMAL LOW (ref >90)
GFR calc non Af Amer: 21 mL/min — ABNORMAL LOW (ref >90)
GFR calc non Af Amer: 21 mL/min — ABNORMAL LOW (ref >90)
GFR, EST AFRICAN AMERICAN: 25 mL/min — AB (ref >90)
Glucose, Bld: 110 mg/dL — ABNORMAL HIGH (ref 70–99)
Glucose, Bld: 156 mg/dL — ABNORMAL HIGH (ref 70–99)
POTASSIUM: 3.3 meq/L — AB (ref 3.7–5.3)
Potassium: 3.8 mEq/L (ref 3.7–5.3)
SODIUM: 142 meq/L (ref 137–147)
SODIUM: 140 meq/L (ref 137–147)

## 2014-05-07 LAB — TROPONIN I: Troponin I: 0.34 ng/mL (ref <0.30)

## 2014-05-07 LAB — CBC
HCT: 33.4 % — ABNORMAL LOW (ref 36.0–46.0)
Hemoglobin: 11.1 g/dL — ABNORMAL LOW (ref 12.0–15.0)
MCH: 30.6 pg (ref 26.0–34.0)
MCHC: 33.2 g/dL (ref 30.0–36.0)
MCV: 92 fL (ref 78.0–100.0)
PLATELETS: 253 10*3/uL (ref 150–400)
RBC: 3.63 MIL/uL — ABNORMAL LOW (ref 3.87–5.11)
RDW: 14.4 % (ref 11.5–15.5)
WBC: 7.9 10*3/uL (ref 4.0–10.5)

## 2014-05-07 MED ORDER — ISOSORBIDE MONONITRATE 15 MG HALF TABLET
15.0000 mg | ORAL_TABLET | Freq: Every day | ORAL | Status: DC
  Administered 2014-05-07 – 2014-05-09 (×3): 15 mg via ORAL
  Filled 2014-05-07 (×3): qty 1

## 2014-05-07 MED ORDER — SODIUM CHLORIDE 0.9 % IJ SOLN: 10.0000 mL | INTRAMUSCULAR | Status: DC | PRN

## 2014-05-07 MED ORDER — HYDRALAZINE HCL 25 MG PO TABS
12.5000 mg | ORAL_TABLET | Freq: Three times a day (TID) | ORAL | Status: DC
  Administered 2014-05-07 – 2014-05-09 (×5): 12.5 mg via ORAL
  Filled 2014-05-07 (×9): qty 0.5

## 2014-05-07 MED ORDER — METOLAZONE 5 MG PO TABS
5.0000 mg | ORAL_TABLET | Freq: Every day | ORAL | Status: DC
  Administered 2014-05-07 – 2014-05-09 (×3): 5 mg via ORAL
  Filled 2014-05-07 (×3): qty 1

## 2014-05-07 MED ORDER — SODIUM CHLORIDE 0.9 % IJ SOLN
10.0000 mL | Freq: Two times a day (BID) | INTRAMUSCULAR | Status: DC
  Administered 2014-05-07 – 2014-05-10 (×8): 10 mL

## 2014-05-07 MED ORDER — SODIUM CHLORIDE 0.9 % IV SOLN
INTRAVENOUS | Status: DC
  Administered 2014-05-07: 10 mL/h via INTRAVENOUS

## 2014-05-07 MED ORDER — FUROSEMIDE 10 MG/ML IJ SOLN
120.0000 mg | Freq: Two times a day (BID) | INTRAVENOUS | Status: DC
  Administered 2014-05-07 – 2014-05-08 (×4): 120 mg via INTRAVENOUS
  Filled 2014-05-07 (×7): qty 12

## 2014-05-07 MED ORDER — MILRINONE IN DEXTROSE 20 MG/100ML IV SOLN
0.1250 ug/kg/min | INTRAVENOUS | Status: DC
  Administered 2014-05-07: 0.25 ug/kg/min via INTRAVENOUS
  Administered 2014-05-08 – 2014-05-09 (×2): 0.125 ug/kg/min via INTRAVENOUS
  Filled 2014-05-07 (×3): qty 100

## 2014-05-07 NOTE — Progress Notes (Signed)
  Date: 05/07/2014  Patient name: Leah Kramer  Medical record number: [id number]  Date of birth: 05-30-1924   I have seen and evaluated Leah Kramer and discussed their care with the Residency Team. [name] was admitted for acute resp distress. She was hospitalized at [facility] in Oct for late presenting AMI and had an ECHO that showed and EF of 20% with basal wall motion abnl. Her EF in the summer was 58%. She did not get a cath the Oct admit 2/2 late presenting and renal failure. Her baseline Cr was reported to be 1.2-1.4. Most recent Cr was 1.9 on the 23rd nov.   On exam, she is HOH until she inserts her hearing aides. Alert, talkative. HRRR Lungs clear laterally Ext no edema ABD + BS, S/NT/ND  Labs and imaging reviewed  Assessment and Plan: I have seen and evaluated the patient as outlined above. I agree with the formulated Assessment and Plan as detailed in the residents' admission note, with the following changes:   1. Acute respiratory failure 2/2 acute on chronic systolic ischemic heart failure - the etiology of the exac could be an AMI - one trop I ws elevated but felt to be 2/2 demand ischemia. PNA but CXR did not show an infiltrate. She is in SNF so her meds and diet are controlled. Most likely cause is that she is not yet optimized on her home cardiac regimen as med as med excalation limited by BP and Cr (ACE held 2/2 Cr). Lasix was recently increased at 11/16 [facility] outpt cards clinic and pt was seen in F/U 12/3 and maintained on same dose as pt was better. She had faint bibasilar crackles on the 3rd appt. She has received IV lasix 80 on admit and started on 80 IV BID. Her I/O are recorded as + 90. Weight here recorded as 58 kg  But at 12/3 was recorded as 62.1 kg. She is symptomatically better today. O2 weaned to 4 L [place] and she is maintaining mid 90's O2 sat. Due to poor diuresis and BP limiting more aggressive tx, Dr B plans to move pt to [place], PICC, and consideration of inotropes. Metolazone  and hydral / nitrates added.   2. Increased LFT's - likely 2/ congestion bc elevated started prior to hypotension. Were nl 04/2014 at [facility]. Cont to follow.  3. Acute on chronic renal failure - likely 2/2 poor CO & hypotension. Unchanged since admit but will limit ability to diurese.   Bartholomew Crews, MD 12/6/201511:24 AM

## 2014-05-07 NOTE — Progress Notes (Signed)
Utilization Review Completed.Leah Kramer T12/10/2013  

## 2014-05-07 NOTE — Progress Notes (Signed)
  Echocardiogram 2D Echocardiogram has been performed.  Leah Kramer 05/07/2014, 5:15 PM

## 2014-05-07 NOTE — Progress Notes (Signed)
Subjective:  Patient reports that she she is doing much better this morning, previously hard to interview her yesterday because of marked somnolence. Patient denies any chest pain and states that she was able to sleep flatter in bed for the first time in any weeks. Although her breathing is much better, patient states that she still has not been having much urine output.  Objective: Vital signs in last 24 hours: Filed Vitals:   05/06/14 2337 05/07/14 0343 05/07/14 0757 05/07/14 0815  BP: 93/53 116/61 111/55   Pulse: 69 78 71   Temp: 98 F (36.7 C) 98.7 F (37.1 C)  98.4 F (36.9 C)  TempSrc: Oral Oral  Oral  Resp: 28 29 20    Weight:  128 lb 12 oz (58.4 kg)    SpO2: 97% 94% 95%    Weight change:   Intake/Output Summary (Last 24 hours) at 05/07/14 1101 Last data filed at 05/07/14 0342  Gross per 24 hour  Intake    300 ml  Output    400 ml  Net   -100 ml   General: resting in bed HEENT: PERRL, EOMI, no scleral icterus, nasal cannula in place, alert and oriented 4 Cardiac: RRR, 2/6 systolic murmur, no rubs, murmurs or gallops Pulm: Bibasilar Rales, though improved from last exam Abd: soft, nontender, nondistended, BS present Ext: warm and well perfused, no pedal edema Neuro: alert and oriented X3, cranial nerves II-XII grossly intact Skin: no rashes or lesions noted Psych: appropriate affect  Lab Results: Basic Metabolic Panel:  Recent Labs Lab 05/06/14 0840 05/07/14 0121  NA 140 142  K 4.1 3.8  CL 96 100  CO2 21 25  GLUCOSE 312* 110*  BUN 38* 39*  CREATININE 1.98* 1.99*  CALCIUM 9.0 8.5   Liver Function Tests:  Recent Labs Lab 05/06/14 0840  AST 140*  ALT 127*  ALKPHOS 55  BILITOT 1.1  PROT 6.5  ALBUMIN 3.4*    Recent Labs Lab 05/06/14 0840  LIPASE 33   No results for input(s): AMMONIA in the last 168 hours. CBC:  Recent Labs Lab 05/06/14 0840 05/07/14 0121  WBC 15.8* 7.9  NEUTROABS 13.4*  --   HGB 13.1 11.1*  HCT 41.1 33.4*  MCV  93.8 92.0  PLT 430* 253   Cardiac Enzymes:  Recent Labs Lab 05/06/14 1346 05/06/14 1900 05/07/14 0121  TROPONINI 0.62* 0.42* 0.34*   BNP:  Recent Labs Lab 05/06/14 0840  PROBNP 41228.0*   D-Dimer: No results for input(s): DDIMER in the last 168 hours. CBG: No results for input(s): GLUCAP in the last 168 hours. Hemoglobin A1C: No results for input(s): HGBA1C in the last 168 hours. Fasting Lipid Panel: No results for input(s): CHOL, HDL, LDLCALC, TRIG, CHOLHDL, LDLDIRECT in the last 168 hours. Thyroid Function Tests: No results for input(s): TSH, T4TOTAL, FREET4, T3FREE, THYROIDAB in the last 168 hours. Coagulation:  Recent Labs Lab 05/06/14 0840  LABPROT 15.2  INR 1.18   Anemia Panel: No results for input(s): VITAMINB12, FOLATE, FERRITIN, TIBC, IRON, RETICCTPCT in the last 168 hours. Urine Drug Screen: Drugs of Abuse  No results found for: LABOPIA, COCAINSCRNUR, LABBENZ, AMPHETMU, THCU, LABBARB  Alcohol Level: No results for input(s): ETH in the last 168 hours. Urinalysis:  Recent Labs Lab 05/06/14 1010  COLORURINE YELLOW  LABSPEC 1.013  PHURINE 5.5  GLUCOSEU NEGATIVE  HGBUR NEGATIVE  BILIRUBINUR NEGATIVE  KETONESUR NEGATIVE  PROTEINUR 30*  UROBILINOGEN 0.2  NITRITE NEGATIVE  LEUKOCYTESUR TRACE*   Micro Results: Recent  Results (from the past 240 hour(s))  MRSA PCR Screening     Status: None   Collection Time: 05/06/14  5:22 PM  Result Value Ref Range Status   MRSA by PCR NEGATIVE NEGATIVE Final    Comment:        The GeneXpert MRSA Assay (FDA approved for NASAL specimens only), is one component of a comprehensive MRSA colonization surveillance program. It is not intended to diagnose MRSA infection nor to guide or monitor treatment for MRSA infections.    Studies/Results: Dg Chest Port 1 View  05/06/2014   CLINICAL DATA:  Respiratory distress. Abdominal pain. Initial encounter.  EXAM: PORTABLE CHEST - 1 VIEW  COMPARISON:  04/07/2014 and  03/21/2014 radiographs.  FINDINGS: 0821 hr. There are new moderate-sized pleural effusions with associated bibasilar atelectasis and mild pulmonary edema. Cardiomegaly appears grossly stable. There is stable atherosclerosis of the thoracic aorta. No pneumothorax or acute osseous findings demonstrated. There are probable loose bodies in the right shoulder suprascapular recess.  IMPRESSION: Cardiomegaly, pulmonary edema and moderate bilateral pleural effusions consistent with congestive heart failure.   Electronically Signed   By: Camie Patience M.D.   On: 05/06/2014 08:37   Medications: I have reviewed the patient's current medications. Scheduled Meds: . aspirin EC  81 mg Oral Daily  . clopidogrel  75 mg Oral q morning - 10a  . furosemide  80 mg Intravenous BID  . heparin  5,000 Units Subcutaneous 3 times per day  . simvastatin  20 mg Oral Daily   Continuous Infusions:  PRN Meds:. Assessment/Plan: Active Problems:   Acute respiratory failure  Patient is an 80 year old female with a history of chronic kidney disease, congestive heart failure, hypertension, diabetes, overactive bladder, and hyperlipidemia who is admitted for acute decompensated heart failure.  Acute decompensated congestive heart failure: Patient with ischemic cardiomyopathy from recent STEMI in October 2015. Prior echocardiogram showing ejection fraction of 20% with severe mitral regurgitation. Admission chest x-ray remarkable for pulmonary edema and an elevated proBNP of 41,000. Course complicated by demand ischemia (see below). Initial ABG showing acute respiratory acidosis though lactate within normal limits. Patient with no dyspnea and satting well on 4 L on exam today. Minimal urine output of 400 mL since admission. -Continue IV Lasix 80 mg twice a day with close monitoring of creatinine -Appreciate cardiology recommendations, will follow-up serial recommendations -Daily weights -Strict I's and O's -echo pending  Demand  ischemia: Troponin elevated at 0.62, now trended down to 0.34. -Continue to trend  Coronary artery disease: [place] to care everywhere, patient had a late STEMI in October at [facility] and treated medically. Ejection fraction of 20% with severe mitral regurgitation. -Continue 10 units aspirin 81 mg and Plavix 75 mg daily -Consider resuming antihypertensives.  Hypotension: Slightly improved this morning in the low 110s/50s.  - Consider resuming antihypertensives as above.  Stage IV chronic kidney disease: Creatinine stable at 1.98, slightly elevated from recent lowest values from care everywhere in the [facility] system (1.51 on 03/27/2014). -Continue to monitor given aggressive diuresis  Elevated liver enzymes: AST of 140 and ALT of 127 with no priors in the system. Hepatocellular pattern with no rise in alkaline phosphatase or bilirubin. Possibly secondary to hepatic congestion. -We will repeat labs tomorrow morning.  Anion gap acidosis: Anion gap trending down to 17 from 23 with a bicarbonate of 25. Patient has an elevated creatinine of 1.99 which has been stable during this admission possibly slightly above baseline. Likely secondary to acute on chronic renal insufficiency. -  Continue to monitor  Normocytic anemia: Hemoglobin trended down to 11.1 from 13.1. -Continue to monitor  Diet: Heart Prophylaxis: heparin sq and SCDs Code: Full (confirmed with family that this is the current status, despite DO NOT RESUSCITATE records in the file)  Resolved: Leukocytosis Thrombocythemia Abdominal pain  Dispo: Disposition is deferred at this time, awaiting improvement of current medical problems. Anticipated discharge in approximately 3 day(s).   The patient does have a current PCP (No primary care provider on file.) and does need an [facility] hospital follow-up appointment after discharge.  The patient does have transportation limitations that hinder transportation to clinic appointments.  LOS: 1 day    Luan Moore, MD 05/07/2014, 11:01 AM

## 2014-05-07 NOTE — Progress Notes (Signed)
Pt to [place] to 2H-27, VSS, called report. Family present & aware of [place].

## 2014-05-07 NOTE — Progress Notes (Addendum)
Subjective:   80 year old female with history of heart failure, chronic kidney disease, hypertension, diabetes, CAD.  Per Care Everywhere she presented to [facility] in October with late STEMI. Treated medically. EF 20% and severe MR. Has been struggling with volume management in setting on chronic renal failure.    TTE 03/2014 [facility]): Severe LV dysfunction (EF 20%) with basal inferior, basal anterospetal, and basal anterior hypokinesis, dilated LV, diastolic dysfunction, elevated LV filling pressures, degen MV with severe MR, dilated LA, moderate AR, severe pulmonary hypertension, normal RV function, mild TR, elevated CVP  Admitted 12/4 with respiratory failure due to recurrent HF. Troponin minimally elevated. No responding well to IV lasix. Still dyspneic.     Intake/Output Summary (Last 24 hours) at 05/07/14 1051 Last data filed at 05/07/14 0342  Gross per 24 hour  Intake    300 ml  Output    400 ml  Net   -100 ml    Current meds: . aspirin EC  81 mg Oral Daily  . clopidogrel  75 mg Oral q morning - 10a  . furosemide  80 mg Intravenous BID  . heparin  5,000 Units Subcutaneous 3 times per day  . simvastatin  20 mg Oral Daily   Infusions:     Objective:  Blood pressure 111/55, pulse 71, temperature 98.4 F (36.9 C), temperature source Oral, resp. rate 20, weight 58.4 kg (128 lb 12 oz), SpO2 95 %. Weight change:   Physical Exam: General: Elderly, sitting up in bed dyspneic with talking Neck: JVD to ear Head: Eyes PERRLA, No xanthomas. Normal cephalic and atramatic Lungs: Mild respiratory noise bilaterally. Normal respiratory effort. No wheezes, no rales. Nasal cannula oxygen Heart: HRRR S1 S2 Pulses are 2+ & equal. 2/6 MR +s3 No carotid bruit.  No abdominal bruits.  Abdomen: Bowel sounds are positive, abdomen soft and non-tender without masses. No hepatosplenomegaly. + marked distension Msk: Back normal. Appears weakened Extremities: No clubbing, cyanosis 1+  edema. DP +1 Neuro: Alert and oriented X 3, non-focal, MAE x 4 GU: Deferred Rectal: Deferred Psych: Good affect, responds appropriately  Telemetry: SR  Lab Results: Basic Metabolic Panel:  Recent Labs Lab 05/06/14 0840 05/07/14 0121  NA 140 142  K 4.1 3.8  CL 96 100  CO2 21 25  GLUCOSE 312* 110*  BUN 38* 39*  CREATININE 1.98* 1.99*  CALCIUM 9.0 8.5   Liver Function Tests:  Recent Labs Lab 05/06/14 0840  AST 140*  ALT 127*  ALKPHOS 55  BILITOT 1.1  PROT 6.5  ALBUMIN 3.4*    Recent Labs Lab 05/06/14 0840  LIPASE 33   No results for input(s): AMMONIA in the last 168 hours. CBC:  Recent Labs Lab 05/06/14 0840 05/07/14 0121  WBC 15.8* 7.9  NEUTROABS 13.4*  --   HGB 13.1 11.1*  HCT 41.1 33.4*  MCV 93.8 92.0  PLT 430* 253   Cardiac Enzymes:  Recent Labs Lab 05/06/14 0840 05/06/14 1346 05/06/14 1900 05/07/14 0121  TROPONINI <0.30 0.62* 0.42* 0.34*   BNP: Invalid input(s): POCBNP CBG: No results for input(s): GLUCAP in the last 168 hours. Microbiology: No results found for: CULT No results for input(s): CULT, SDES in the last 168 hours.  Imaging: Dg Chest Port 1 View  05/06/2014   CLINICAL DATA:  Respiratory distress. Abdominal pain. Initial encounter.  EXAM: PORTABLE CHEST - 1 VIEW  COMPARISON:  04/07/2014 and 03/21/2014 radiographs.  FINDINGS: 0821 hr. There are new moderate-sized pleural effusions with associated bibasilar atelectasis and mild  pulmonary edema. Cardiomegaly appears grossly stable. There is stable atherosclerosis of the thoracic aorta. No pneumothorax or acute osseous findings demonstrated. There are probable loose bodies in the right shoulder suprascapular recess.  IMPRESSION: Cardiomegaly, pulmonary edema and moderate bilateral pleural effusions consistent with congestive heart failure.   Electronically Signed   By: Camie Patience M.D.   On: 05/06/2014 08:37     ASSESSMENT:  1. Acute respiratory failure 2. Acute on chronic  systolic HF (EF 29%) 3. CKD, stage 4 4. Severe MR 5. CAD s/p recent STEMI at [facility] 10/15 - late presenting. Treated medically 6. Elevated troponin   PLAN/DISCUSSION:  She has significant volume overload with probable low output HF in the setting of EF 20% and severe MR. Management complicated by severe CKD with GFR <20. Long talk with patient and family about severity of her condition and the heart-kidney interaction.  She is not responding well to IV lasix.  May need inotropes. Will place PICC to help sort out if she has low output or if renal dysfunction is limiting diuresis. Increase lasix to 120 BID. Add metolazone. Start afterload reduction with hydral/nitrates. Not candidate for b-blocker or ACE/ARB at this time.   Minimally elevated troponin likely due to HF +/- demand ischemia. Not ACS.  With permission for IMTS will move to [facility] SDU for closer cardiac monitoring. The AHF team will follow.    LOS: 1 day    Glori Bickers, MD 05/07/2014, 10:51 AM

## 2014-05-07 NOTE — Progress Notes (Signed)
Peripherally Inserted Central Catheter/Midline Placement  The IV Nurse has discussed with the patient and/or persons authorized to consent for the patient, the purpose of this procedure and the potential benefits and risks involved with this procedure.  The benefits include less needle sticks, lab draws from the catheter and patient may be discharged home with the catheter.  Risks include, but not limited to, infection, bleeding, blood clot (thrombus formation), and puncture of an artery; nerve damage and irregular heat beat.  Alternatives to this procedure were also discussed.  PICC/Midline Placement Documentation  PICC Triple Lumen 05/07/14 PICC Right Brachial 37 cm 0 cm (Active)  Indication for Insertion or Continuance of Line Prolonged intravenous therapies;Limited venous access - need for IV therapy >5 days (PICC only);Vasoactive infusions 05/07/2014  3:29 PM  Exposed Catheter (cm) 0 cm 05/07/2014  3:29 PM  Site Assessment Clean;Dry;Intact 05/07/2014  3:29 PM  Lumen #1 Status Flushed;Saline locked;Blood return noted 05/07/2014  3:29 PM  Lumen #2 Status Flushed;Saline locked;Blood return noted 05/07/2014  3:29 PM  Lumen #3 Status Flushed;Saline locked;Blood return noted 05/07/2014  3:29 PM  Dressing Type Transparent 05/07/2014  3:29 PM  Dressing Status Clean;Dry;Intact;Antimicrobial disc in place 05/07/2014  3:29 PM  Line Care Connections checked and tightened 05/07/2014  3:29 PM  Line Adjustment (NICU/IV Team Only) No 05/07/2014  3:29 PM  Dressing Intervention New dressing 05/07/2014  3:29 PM  Dressing Change Due 05/14/14 05/07/2014  3:29 PM       Rolena Infante 05/07/2014, 3:30 PM

## 2014-05-08 DIAGNOSIS — I4891 Unspecified atrial fibrillation: Secondary | ICD-10-CM

## 2014-05-08 DIAGNOSIS — I509 Heart failure, unspecified: Secondary | ICD-10-CM | POA: Insufficient documentation

## 2014-05-08 DIAGNOSIS — I480 Paroxysmal atrial fibrillation: Secondary | ICD-10-CM

## 2014-05-08 DIAGNOSIS — D649 Anemia, unspecified: Secondary | ICD-10-CM

## 2014-05-08 DIAGNOSIS — I1310 Hypertensive heart and chronic kidney disease without heart failure, with stage 1 through stage 4 chronic kidney disease, or unspecified chronic kidney disease: Secondary | ICD-10-CM

## 2014-05-08 LAB — CARBOXYHEMOGLOBIN
Carboxyhemoglobin: 0.9 % (ref 0.5–1.5)
Methemoglobin: 1 % (ref 0.0–1.5)
O2 Saturation: 61.8 %
TOTAL HEMOGLOBIN: 12.7 g/dL (ref 12.0–16.0)

## 2014-05-08 LAB — CBC
HCT: 35.6 % — ABNORMAL LOW (ref 36.0–46.0)
HEMOGLOBIN: 11.7 g/dL — AB (ref 12.0–15.0)
MCH: 31 pg (ref 26.0–34.0)
MCHC: 32.9 g/dL (ref 30.0–36.0)
MCV: 94.2 fL (ref 78.0–100.0)
Platelets: 251 10*3/uL (ref 150–400)
RBC: 3.78 MIL/uL — ABNORMAL LOW (ref 3.87–5.11)
RDW: 14.3 % (ref 11.5–15.5)
WBC: 7 10*3/uL (ref 4.0–10.5)

## 2014-05-08 LAB — BASIC METABOLIC PANEL
Anion gap: 14 (ref 5–15)
BUN: 38 mg/dL — AB (ref 6–23)
CO2: 32 meq/L (ref 19–32)
CREATININE: 1.89 mg/dL — AB (ref 0.50–1.10)
Calcium: 8.7 mg/dL (ref 8.4–10.5)
Chloride: 97 mEq/L (ref 96–112)
GFR calc non Af Amer: 23 mL/min — ABNORMAL LOW (ref >90)
GFR, EST AFRICAN AMERICAN: 26 mL/min — AB (ref >90)
GLUCOSE: 125 mg/dL — AB (ref 70–99)
POTASSIUM: 3.7 meq/L (ref 3.7–5.3)
Sodium: 143 mEq/L (ref 137–147)

## 2014-05-08 MED ORDER — FUROSEMIDE 10 MG/ML IJ SOLN
80.0000 mg | Freq: Two times a day (BID) | INTRAMUSCULAR | Status: DC
  Administered 2014-05-09: 80 mg via INTRAVENOUS
  Filled 2014-05-08 (×3): qty 8

## 2014-05-08 MED ORDER — HEPARIN (PORCINE) IN NACL 100-0.45 UNIT/ML-% IJ SOLN: 650.0000 [IU]/h | INTRAMUSCULAR | Status: DC

## 2014-05-08 MED ORDER — POTASSIUM CHLORIDE CRYS ER 20 MEQ PO TBCR
40.0000 meq | EXTENDED_RELEASE_TABLET | Freq: Once | ORAL | Status: AC
  Administered 2014-05-08: 40 meq via ORAL
  Filled 2014-05-08: qty 2

## 2014-05-08 MED ORDER — METOPROLOL TARTRATE 1 MG/ML IV SOLN
2.5000 mg | INTRAVENOUS | Status: DC | PRN
  Administered 2014-05-08: 2.5 mg via INTRAVENOUS
  Filled 2014-05-08: qty 5

## 2014-05-08 MED ORDER — APIXABAN 2.5 MG PO TABS
2.5000 mg | ORAL_TABLET | Freq: Two times a day (BID) | ORAL | Status: DC
  Administered 2014-05-08 – 2014-05-11 (×7): 2.5 mg via ORAL
  Filled 2014-05-08 (×8): qty 1

## 2014-05-08 NOTE — Progress Notes (Signed)
ANTICOAGULATION CONSULT NOTE - Initial Consult  Pharmacy Consult for apixaban Indication: atrial fibrillation  Allergies  Allergen Reactions  . Lipitor [Atorvastatin] Other (See Comments)    Unknown  . Sulfa Antibiotics Other (See Comments)    Unknown    Patient Measurements: Weight: 118 lb 9.7 oz (53.8 kg)   Vital Signs: Temp: 97.5 F (36.4 C) (12/07 0300) Temp Source: Oral (12/07 0300) BP: 93/56 mmHg (12/07 0629) Pulse Rate: 104 (12/07 0300)  Labs:  Recent Labs  05/06/14 0840 05/06/14 1346 05/06/14 1900 05/07/14 0121 05/07/14 2030 05/08/14 0545  HGB 13.1  --   --  11.1*  --  11.7*  HCT 41.1  --   --  33.4*  --  35.6*  PLT 430*  --   --  253  --  251  LABPROT 15.2  --   --   --   --   --   INR 1.18  --   --   --   --   --   CREATININE 1.98*  --   --  1.99* 2.03* 1.89*  TROPONINI <0.30 0.62* 0.42* 0.34*  --   --     CrCl cannot be calculated (Unknown ideal weight.).   Medical History: Past Medical History  Diagnosis Date  . CKD (chronic kidney disease)   . CHF (congestive heart failure)   . STEMI (ST elevation myocardial infarction)   . Hypertension   . Diabetes mellitus without complication   . Difficulty walking   . Overactive bladder   . Hyperlipidemia     Medications:  See EMR for complete pta med list  Assessment: 80 year old female with a history of chronic kidney disease, congestive heart failure, hypertension, diabetes, overactive bladder, and hyperlipidemia who is admitted for acute decompensated heart failure.  Patient went into atrial fibrillation overnight, was in approximately 6 hours then spontaneously converted this am back to NSR. No history of afib prior, will start apixaban for anticoagulation. Patient meets all 3/3 criteria for renal dose adjustment. Per discussion with cardiology, apixaban believed to be lower bleeding risk compared with heparin/warfarin bridge.   Chads2vasc  ~7  CBC stable past two days, no bleeding noted this  am, was on sq heparin for dvt px.   Goal of Therapy:   Monitor platelets by anticoagulation protocol: Yes   Plan:  Apixaban 2.5mg  bid Will provide education tomorrow Check cbc in am  Erin Hearing PharmD., BCPS Clinical Pharmacist Pager [phone] 05/08/2014 11:24 AM

## 2014-05-08 NOTE — Progress Notes (Signed)
Subjective:   80 year old female with history of heart failure, chronic kidney disease, hypertension, diabetes, CAD.  Per Care Everywhere she presented to [facility] in October with late STEMI. Treated medically. EF 20% and severe MR. Has been struggling with volume management in setting on chronic renal failure.    TTE 03/2014 [facility]): Severe LV dysfunction (EF 20%) with basal inferior, basal anterospetal, and basal anterior hypokinesis, dilated LV, diastolic dysfunction, elevated LV filling pressures, degen MV with severe MR, dilated LA, moderate AR, severe pulmonary hypertension, normal RV function, mild TR, elevated CVP  Admitted 12/4 with respiratory failure due to recurrent HF. Troponin minimally elevated. Transferred to stepdown yesterday (12/6) and PICC placed. Co-ox initially 57% and milrinone 0.25 mcg started. Went into afib and teaching service started IV metoprolol. Weight down 10 lbs, 24 hr I/O -2 liters. Co-ox 62%. Renal function trending down.    Intake/Output Summary (Last 24 hours) at 05/08/14 0714 Last data filed at 05/08/14 0600  Gross per 24 hour  Intake 1090.26 ml  Output   3050 ml  Net -1959.74 ml    Current meds: . aspirin EC  81 mg Oral Daily  . clopidogrel  75 mg Oral q morning - 10a  . furosemide  120 mg Intravenous BID  . heparin  5,000 Units Subcutaneous 3 times per day  . hydrALAZINE  12.5 mg Oral 3 times per day  . isosorbide mononitrate  15 mg Oral Daily  . metolazone  5 mg Oral Daily  . simvastatin  20 mg Oral Daily  . sodium chloride  10-40 mL Intracatheter Q12H   Infusions: . sodium chloride 6 mL/hr at 05/07/14 2036  . milrinone 0.25 mcg/kg/min (05/07/14 2036)     Objective:  Blood pressure 93/56, pulse 104, temperature 97.5 F (36.4 C), temperature source Oral, resp. rate 22, weight 118 lb 9.7 oz (53.8 kg), SpO2 95 %. Weight change: -10 lb 2.3 oz (-4.6 kg)  Physical Exam: General: Elderly, lying flat in bed Neck: JVD 8 Head: Eyes PERRLA, No  xanthomas. Normal cephalic and atramatic Lungs: Mild respiratory noise bilaterally. Normal respiratory effort. No wheezes, no rales. Nasal cannula oxygen Heart:Regular rate and rhythm, S1 S2 Pulses are 2+ & equal. 2/6 MR +s3 No carotid bruit.  No abdominal bruits.  Abdomen: Bowel sounds are positive, abdomen soft and non-tender without masses. No hepatosplenomegaly. Mildly distended Msk: Back normal. Appears weakened Extremities: No clubbing, cyanosis, no edema. DP +1 Neuro: Alert and oriented X 3, non-focal, MAE x 4 GU: Deferred Rectal: Deferred Psych: Good affect, responds appropriately  Telemetry: SR 80s  Lab Results: Basic Metabolic Panel:  Recent Labs Lab 05/06/14 0840 05/07/14 0121 05/07/14 2030 05/08/14 0545  NA 140 142 140 143  K 4.1 3.8 3.3* 3.7  CL 96 100 97 97  CO2 21 25 30  32  GLUCOSE 312* 110* 156* 125*  BUN 38* 39* 40* 38*  CREATININE 1.98* 1.99* 2.03* 1.89*  CALCIUM 9.0 8.5 8.6 8.7   Liver Function Tests:  Recent Labs Lab 05/06/14 0840  AST 140*  ALT 127*  ALKPHOS 55  BILITOT 1.1  PROT 6.5  ALBUMIN 3.4*    Recent Labs Lab 05/06/14 0840  LIPASE 33   No results for input(s): AMMONIA in the last 168 hours. CBC:  Recent Labs Lab 05/06/14 0840 05/07/14 0121 05/08/14 0545  WBC 15.8* 7.9 7.0  NEUTROABS 13.4*  --   --   HGB 13.1 11.1* 11.7*  HCT 41.1 33.4* 35.6*  MCV 93.8 92.0 94.2  PLT 430* 253 251   Cardiac Enzymes:  Recent Labs Lab 05/06/14 0840 05/06/14 1346 05/06/14 1900 05/07/14 0121  TROPONINI <0.30 0.62* 0.42* 0.34*   BNP: Invalid input(s): POCBNP CBG: No results for input(s): GLUCAP in the last 168 hours. Microbiology: No results found for: CULT No results for input(s): CULT, SDES in the last 168 hours.  Imaging: Dg Chest Port 1 View  05/07/2014   CLINICAL DATA:  PICC line placement  EXAM: PORTABLE CHEST - 1 VIEW  COMPARISON:  05/06/2014  FINDINGS: Right-sided PICC line with the tip projecting over  the right atrium. Recommend retracting the PICC line 4 cm.  Bilateral small pleural effusions, left greater than right. Mild bilateral interstitial thickening. No pneumothorax. Stable cardiomediastinal silhouette. No acute osseous abnormality.  IMPRESSION: 1. Right-sided PICC line with the tip projecting over the right atrium. Recommend retracting the PICC line 4 cm. 2. Stable CHF.   Electronically Signed   By: Kathreen Devoid   On: 05/07/2014 16:57   Dg Chest Port 1 View  05/06/2014   CLINICAL DATA:  Respiratory distress. Abdominal pain. Initial encounter.  EXAM: PORTABLE CHEST - 1 VIEW  COMPARISON:  04/07/2014 and 03/21/2014 radiographs.  FINDINGS: 0821 hr. There are new moderate-sized pleural effusions with associated bibasilar atelectasis and mild pulmonary edema. Cardiomegaly appears grossly stable. There is stable atherosclerosis of the thoracic aorta. No pneumothorax or acute osseous findings demonstrated. There are probable loose bodies in the right shoulder suprascapular recess.  IMPRESSION: Cardiomegaly, pulmonary edema and moderate bilateral pleural effusions consistent with congestive heart failure.   Electronically Signed   By: Camie Patience M.D.   On: 05/06/2014 08:37     ASSESSMENT:  1. Acute respiratory failure 2. Acute on chronic systolic HF (EF 76%) 3. CKD, stage 4 4. Severe MR 5. CAD s/p recent STEMI at [facility] 10/15 - late presenting. Treated medically 6. Elevated troponin  7. Atrial fibrillation  PLAN/DISCUSSION:  She was admitted for significant volume overload with probable low output HF in the setting of EF 20% and severe MR. Management complicated by severe CKD with GFR <20. Long talk with patient and family about severity of her condition and the heart-kidney interaction.  12/6 transferred to stepdown and had PICC placed. Initial co-ox 57% and she was started on milrinone 0.25 mcg. Co-ox this am 62%. Will wean down to 0.125 mcg and continue to follow co-ox closely. Volume status  improving and she is almost at baseline. Weight down 10 lbs and CVP 8-9. Will continue IV lasix one more day and will try to transition to PO tomorrow.   Went into atrial fibrillation last night and rate was uncontrolled until this am. She just converted back into NSR with rate 80s. Will try to wean the milrinone off likely contributing to her atrial fibrillation. Will start apixaban 2.5 mg BID.   Continue hyral/nitrates for afterload reduction. Not candidate for b-blocker d/t A/C HF. Will stop IV metoprolol. No ACE-I or ARB d/t CKD.   Continue PT.    LOS: 2 days  Rande Brunt, NP-C 05/08/2014, 7:14 AM  Patient seen and examined with Junie Bame, NP. We discussed all aspects of the encounter. I agree with the assessment and plan as stated above.   She did not respond to high-dose lasix. Milrinone started with good response. Renal function and volume status improved. However sh had 6hr episode of AF. Will decrease milrinone to 0.125. Continue lasix 80 iv bid. Titrate hydral/nitrates as tolerated. Start Eliquis 2.5 bid for  AF. No amio yet.   I think it may be quite a challenge to keep her dry given her low output and CKD. May need to consider home inotropes.   Will ambulate with CR.   Allicia Culley,MD 7:58 AM

## 2014-05-08 NOTE — Evaluation (Signed)
Physical Therapy Evaluation Patient Details Name: Leah Kramer MRN: [id number] DOB: [birth year] Today's Date: 05/08/2014   History of Present Illness  Pt adm with respiratory failure. Pt with acute on chronic renal failure and stage 4 kidney failure. Pt also with new onset a-fib with RVR. PMH - recent STEMI, DM, HTN  Clinical Impression  Pt admitted with above. Pt currently with functional limitations due to the deficits listed below (see PT Problem List).  Pt will benefit from skilled PT to increase their independence and safety with mobility to allow discharge to the venue listed below.       Follow Up Recommendations SNF    Equipment Recommendations  None recommended by PT    Recommendations for Other Services       Precautions / Restrictions Precautions Precautions: Fall      Mobility  Bed Mobility Overal bed mobility: Needs Assistance Bed Mobility: Supine to Sit     Supine to sit: Min assist;HOB elevated     General bed mobility comments: Asssit bringing trunk up and hips to EOB.  Transfers Overall transfer level: Needs assistance Equipment used: Rolling walker (2 wheeled) Transfers: Sit to/from Stand Sit to Stand: Min assist;+2 safety/equipment         General transfer comment: Verbal/tactile cues for hand placement and assist to bring hips up and for balance.  Ambulation/Gait Ambulation/Gait assistance: Min assist;+2 safety/equipment Ambulation Distance (Feet): 100 Feet Assistive device: Rolling walker (2 wheeled) Gait Pattern/deviations: Step-through pattern;Decreased step length - right;Decreased step length - left;Trunk flexed   Gait velocity interpretation: Below normal speed for age/gender General Gait Details: Verbal cues to stand more erect. Pt incontinent of stool initially. After using BSC was able to continue amb. HR and SaO2 remained stable. HR was in 90's and SaO2 was 94% on O2.  Stairs            Wheelchair Mobility    Modified  Rankin (Stroke Patients Only)       Balance Overall balance assessment: Needs assistance Sitting-balance support: No upper extremity supported;Feet supported Sitting balance-Leahy Scale: Fair     Standing balance support: Bilateral upper extremity supported Standing balance-Leahy Scale: Poor Standing balance comment: support of walker and min guard assist for static standing                             Pertinent Vitals/Pain Pain Assessment: No/denies pain    Home Living Family/patient expects to be discharged to:: Skilled nursing facility Living Arrangements: Spouse/significant other Available Help at Discharge: Family Type of Home: House Home Access: Stairs to enter Entrance Stairs-Rails: Right Entrance Stairs-Number of Steps: 4 Home Layout: Two level;Able to live on main level with bedroom/bathroom Home Equipment: Walker - 2 wheels      Prior Function           Comments: Prior to recent hospitalization and SNF stay pt was amb independently but since recent medical issues has been amb with rolling walker with assistance at SNF.     Hand Dominance        Extremity/Trunk Assessment   Upper Extremity Assessment: Defer to OT evaluation           Lower Extremity Assessment: Generalized weakness         Communication   Communication: HOH  Cognition Arousal/Alertness: Awake/alert Behavior During Therapy: WFL for tasks assessed/performed Overall Cognitive Status: Within Functional Limits for tasks assessed  General Comments      Exercises        Assessment/Plan    PT Assessment Patient needs continued PT services  PT Diagnosis Difficulty walking;Generalized weakness   PT Problem List Decreased strength;Decreased activity tolerance;Decreased balance;Decreased mobility;Decreased knowledge of use of DME  PT Treatment Interventions DME instruction;Balance training;Gait training;Patient/family  education;Functional mobility training;Therapeutic activities;Therapeutic exercise   PT Goals (Current goals can be found in the Care Plan section) Acute Rehab PT Goals Patient Stated Goal: Return home PT Goal Formulation: With patient Time For Goal Achievement: 05/15/14 Potential to Achieve Goals: Good    Frequency Min 3X/week   Barriers to discharge        Co-evaluation               End of Session Equipment Utilized During Treatment: Gait belt;Oxygen Activity Tolerance: Patient tolerated treatment well Patient left: in chair;with call bell/phone within reach;with chair alarm set;with family/visitor present Nurse Communication: Mobility status         Time: 0932-1000 PT Time Calculation (min) (ACUTE ONLY): 28 min   Charges:   PT Evaluation $Initial PT Evaluation Tier I: 1 Procedure PT Treatments $Gait Training: 23-37 mins   PT G Codes:          Hristopher Missildine 05/30/14, 12:05 PM  [employer] PT [phone]

## 2014-05-08 NOTE — Evaluation (Signed)
Occupational Therapy Evaluation Patient Details Name: Leah Kramer MRN: [id number] DOB: [birth year] Today's Date: 05/08/2014    History of Present Illness Pt adm with respiratory failure. Pt with acute on chronic renal failure and stage 4 kidney failure. Pt also with new onset a-fib with RVR. PMH - recent STEMI, DM, HTN   Clinical Impression   Pt admitted with above. She demonstrates the below listed deficits and will benefit from continued OT to maximize safety and independence with BADLs.  Pt presents to OT with generalized weakness.  She has been at [facility] level rehab and was hoping to discharge home this week; however, anticipate she will need to return to SNF for more rehab prior to discharge home.  Currently, she requires min A for BADLs.       Follow Up Recommendations  SNF    Equipment Recommendations  None recommended by OT    Recommendations for Other Services       Precautions / Restrictions Precautions Precautions: Fall      Mobility Bed Mobility Overal bed mobility: Needs Assistance Bed Mobility: Sit to Supine       Sit to supine: Min guard      Transfers Overall transfer level: Needs assistance Equipment used: Rolling walker (2 wheeled) Transfers: Sit to/from [facility] Sit to Stand: Min guard Stand pivot transfers: Min guard            Balance Overall balance assessment: Needs assistance Sitting-balance support: Feet supported Sitting balance-Leahy Scale: Good     Standing balance support: During functional activity Standing balance-Leahy Scale: Fair                              ADL Overall ADL's : Needs assistance/impaired Eating/Feeding: Independent;Sitting   Grooming: Wash/dry hands;Wash/dry face;Oral care;Min guard;Standing   Upper Body Bathing: Set up;Sitting   Lower Body Bathing: Minimal assistance;Sit to/from stand   Upper Body Dressing : Set up;Sitting   Lower Body Dressing: Min guard;Sit to/from  stand   Toilet Transfer: Min guard;Ambulation;Comfort height toilet   Toileting- Clothing Manipulation and Hygiene: Minimal assistance;Sit to/from stand       Functional mobility during ADLs: Min guard;Rolling walker General ADL Comments: Pt is very motivated.  Dyspnea 2/4 on 3L 02.  She requires min guard assist due to balance, and min A to manipulate clothing      Vision                     Perception     Praxis      Pertinent Vitals/Pain Pain Assessment: No/denies pain     Hand Dominance Right   Extremity/Trunk Assessment Upper Extremity Assessment Upper Extremity Assessment: Generalized weakness   Lower Extremity Assessment Lower Extremity Assessment: Defer to PT evaluation       Communication Communication Communication: HOH   Cognition Arousal/Alertness: Awake/alert Behavior During Therapy: WFL for tasks assessed/performed Overall Cognitive Status: Within Functional Limits for tasks assessed                     General Comments       Exercises       Shoulder Instructions      Home Living Family/patient expects to be discharged to:: Skilled nursing facility Living Arrangements: Spouse/significant other Available Help at Discharge: Family Type of Home: House Home Access: Stairs to enter [employer] of Steps: 4 Entrance Stairs-Rails: Right Home Layout: Two level;Able to live  on main level with bedroom/bathroom               Home Equipment: Walker - 2 wheels   Additional Comments: Pt has been at SNF level rehab since MI, and was scheduled for discharge home later this week.       Prior Functioning/Environment Level of Independence: Needs assistance    ADL's / Homemaking Assistance Needed: Pt reports she has been working on ADLs, that she is able to perform BADLs, but has been very fatigued and struggling with them at rehab    Comments: Prior to recent hospitalization and SNF stay pt was amb independently but  since recent medical issues has been amb with rolling walker with assistance at SNF.    OT Diagnosis: Generalized weakness   OT Problem List: Decreased strength;Decreased activity tolerance;Impaired balance (sitting and/or standing);Cardiopulmonary status limiting activity   OT Treatment/Interventions: Self-care/ADL training;DME and/or AE instruction;Therapeutic activities;Balance training;Patient/family education    OT Goals(Current goals can be found in the care plan section) Acute Rehab OT Goals Patient Stated Goal: Return home OT Goal Formulation: With patient Time For Goal Achievement: 05/22/14 Potential to Achieve Goals: Good ADL Goals Pt Will Perform Lower Body Bathing: with supervision;sit to/from stand Pt Will Perform Lower Body Dressing: with supervision;sit to/from stand Pt Will Transfer to Toilet: with supervision;ambulating;regular height toilet;grab bars Pt Will Perform Toileting - Clothing Manipulation and hygiene: with supervision;sit to/from stand Additional ADL Goal #1: Pt will participate in 25 mins therapeutic activity with no more than 1 rest break to increase endurance needed for ADLs.   OT Frequency: Min 2X/week   Barriers to D/C: Decreased caregiver support          Co-evaluation              End of Session Equipment Utilized During Treatment: Rolling walker;Oxygen Nurse Communication: Mobility status  Activity Tolerance: Patient tolerated treatment well Patient left: in bed;with call bell/phone within reach   Time: 5859-2924 OT Time Calculation (min): 29 min Charges:  OT General Charges $OT Visit: 1 Procedure OT Evaluation $Initial OT Evaluation Tier I: 1 Procedure OT Treatments $Self Care/Home Management : 8-22 mins $Therapeutic Activity: 8-22 mins G-Codes:    Dyanara Cozza M 05-30-2014, 4:40 PM

## 2014-05-08 NOTE — Plan of Care (Signed)
Problem: Phase I Progression Outcomes Goal: Flu/PneumoVaccines if indicated Outcome: Completed/Met Date Met:  05/08/14 Goal: Pain controlled Outcome: Completed/Met Date Met:  05/08/14 Goal: Progress activity as tolerated unless otherwise ordered Outcome: Progressing Goal: Discharge plan established Outcome: Progressing

## 2014-05-08 NOTE — Care Management Note (Addendum)
    Page 1 of 1   05/09/2014     9:26:04 AM CARE MANAGEMENT NOTE 05/09/2014  Patient:  Leah Kramer, Leah Kramer   Account Number:  [id number]  Date Initiated:  05/08/2014  Documentation initiated by:  Elissa Hefty  Subjective/Objective Assessment:   adm w resp failure     Action/Plan:   from nsg facility   Anticipated DC Date:     Anticipated DC Plan:  [place] referral  Clinical Social Worker      DC Forensic scientist  CM consult  Medication Assistance      Choice offered to / List presented to:             Status of service:   Medicare Important Message given?  YES (If response is "NO", the following Medicare IM given date fields will be blank) Date Medicare IM given:  05/09/2014 Medicare IM given by:  Elissa Hefty Date Additional Medicare IM given:   Additional Medicare IM given by:    Discharge Disposition:    Per UR Regulation:  Reviewed for med. necessity/level of care/duration of stay  If discussed at [place] of Stay Meetings, dates discussed:    Comments:  12/7 1119 debbie Iniya Matzek rn,bsn pt started on eliquis. gave fam 30day free eliquis card.

## 2014-05-08 NOTE — Progress Notes (Signed)
CARDIAC REHAB PHASE I   PRE:  Rate/Rhythm: 90 SR with PACs    BP: sitting 103/46    SaO2: 97 3L  MODE:  Ambulation: 130 ft   POST:  Rate/Rhythm: 113 afib?    BP: sitting 140/50     SaO2: 98 3L  Pt able to get out of bed without much assist. Ambulated 130 ft assist x2 (supervision) with RW. Pt denied SOB although seemed DOE with walking and talking to [place] at about 1/2 way. Return to recliner. Will continue to follow. Very cooperative. [id number]   Leah Kramer CES, ACSM 05/08/2014 2:39 PM

## 2014-05-08 NOTE — Progress Notes (Signed)
Subjective:  Patient continues to report feeling better today. Patient has also been having much improved urine output. Patient is reporting no other complaints this morning.  Objective: Vital signs in last 24 hours: Filed Vitals:   05/08/14 0500 05/08/14 0600 05/08/14 0629 05/08/14 0800  BP: 95/48 100/69 93/56 110/54  Pulse:    89  Temp:    97.3 F (36.3 C)  TempSrc:    Oral  Resp: 21 20 22 24   Weight: 118 lb 9.7 oz (53.8 kg)     SpO2: 98% 96% 95% 96%   Weight change: -10 lb 2.3 oz (-4.6 kg)  Intake/Output Summary (Last 24 hours) at 05/08/14 0919 Last data filed at 05/08/14 0600  Gross per 24 hour  Intake 850.26 ml  Output   3000 ml  Net -2149.74 ml   General: resting in bed HEENT: PERRL, EOMI, no scleral icterus, nasal cannula in place, alert and oriented 4 Cardiac: RRR, 2/6 systolic murmur, no rubs, murmurs or gallops, JVD to the level of the mandible Pulm: Bibasilar Rales Abd: soft, nontender, nondistended, BS present Ext: warm and well perfused, no pedal edema Neuro: alert and oriented X3, cranial nerves II-XII grossly intact Skin: no rashes or lesions noted Psych: appropriate affect  Lab Results: Basic Metabolic Panel:  Recent Labs Lab 05/07/14 2030 05/08/14 0545  NA 140 143  K 3.3* 3.7  CL 97 97  CO2 30 32  GLUCOSE 156* 125*  BUN 40* 38*  CREATININE 2.03* 1.89*  CALCIUM 8.6 8.7   Liver Function Tests:  Recent Labs Lab 05/06/14 0840  AST 140*  ALT 127*  ALKPHOS 55  BILITOT 1.1  PROT 6.5  ALBUMIN 3.4*    Recent Labs Lab 05/06/14 0840  LIPASE 33   No results for input(s): AMMONIA in the last 168 hours. CBC:  Recent Labs Lab 05/06/14 0840 05/07/14 0121 05/08/14 0545  WBC 15.8* 7.9 7.0  NEUTROABS 13.4*  --   --   HGB 13.1 11.1* 11.7*  HCT 41.1 33.4* 35.6*  MCV 93.8 92.0 94.2  PLT 430* 253 251   Cardiac Enzymes:  Recent Labs Lab 05/06/14 1346 05/06/14 1900 05/07/14 0121  TROPONINI 0.62* 0.42* 0.34*   BNP:  Recent  Labs Lab 05/06/14 0840  PROBNP 41228.0*   D-Dimer: No results for input(s): DDIMER in the last 168 hours. CBG: No results for input(s): GLUCAP in the last 168 hours. Hemoglobin A1C: No results for input(s): HGBA1C in the last 168 hours. Fasting Lipid Panel: No results for input(s): CHOL, HDL, LDLCALC, TRIG, CHOLHDL, LDLDIRECT in the last 168 hours. Thyroid Function Tests: No results for input(s): TSH, T4TOTAL, FREET4, T3FREE, THYROIDAB in the last 168 hours. Coagulation:  Recent Labs Lab 05/06/14 0840  LABPROT 15.2  INR 1.18   Anemia Panel: No results for input(s): VITAMINB12, FOLATE, FERRITIN, TIBC, IRON, RETICCTPCT in the last 168 hours. Urine Drug Screen: Drugs of Abuse  No results found for: LABOPIA, COCAINSCRNUR, LABBENZ, AMPHETMU, THCU, LABBARB  Alcohol Level: No results for input(s): ETH in the last 168 hours. Urinalysis:  Recent Labs Lab 05/06/14 1010  COLORURINE YELLOW  LABSPEC 1.013  PHURINE 5.5  GLUCOSEU NEGATIVE  HGBUR NEGATIVE  BILIRUBINUR NEGATIVE  KETONESUR NEGATIVE  PROTEINUR 30*  UROBILINOGEN 0.2  NITRITE NEGATIVE  LEUKOCYTESUR TRACE*   Micro Results: Recent Results (from the past 240 hour(s))  MRSA PCR Screening     Status: None   Collection Time: 05/06/14  5:22 PM  Result Value Ref Range Status   MRSA  by PCR NEGATIVE NEGATIVE Final    Comment:        The GeneXpert MRSA Assay (FDA approved for NASAL specimens only), is one component of a comprehensive MRSA colonization surveillance program. It is not intended to diagnose MRSA infection nor to guide or monitor treatment for MRSA infections.    Studies/Results: Dg Chest Port 1 View  05/07/2014   CLINICAL DATA:  PICC line placement  EXAM: PORTABLE CHEST - 1 VIEW  COMPARISON:  05/06/2014  FINDINGS: Right-sided PICC line with the tip projecting over the right atrium. Recommend retracting the PICC line 4 cm.  Bilateral small pleural effusions, left greater than right. Mild bilateral  interstitial thickening. No pneumothorax. Stable cardiomediastinal silhouette. No acute osseous abnormality.  IMPRESSION: 1. Right-sided PICC line with the tip projecting over the right atrium. Recommend retracting the PICC line 4 cm. 2. Stable CHF.   Electronically Signed   By: Kathreen Devoid   On: 05/07/2014 16:57   Medications: I have reviewed the patient's current medications. Scheduled Meds: . apixaban  2.5 mg Oral BID  . clopidogrel  75 mg Oral q morning - 10a  . furosemide  120 mg Intravenous BID  . hydrALAZINE  12.5 mg Oral 3 times per day  . isosorbide mononitrate  15 mg Oral Daily  . metolazone  5 mg Oral Daily  . simvastatin  20 mg Oral Daily  . sodium chloride  10-40 mL Intracatheter Q12H   Continuous Infusions: . sodium chloride 6 mL/hr at 05/07/14 2036  . milrinone 0.125 mcg/kg/min (05/08/14 0911)   PRN Meds:.sodium chloride Assessment/Plan: Principal Problem:   Acute respiratory failure Active Problems:   Acute on chronic systolic congestive heart failure   Severe mitral regurgitation   Chronic renal failure, stage 4 (severe)   Coronary artery disease due to lipid rich plaque  Patient is an 80 year old female with a history of chronic kidney disease, congestive heart failure, hypertension, diabetes, overactive bladder, and hyperlipidemia who is admitted for acute decompensated heart failure.  Acute decompensated congestive heart failure as part of cardiorenal syndrome: Patient with ischemic cardiomyopathy from recent STEMI in October 2015. Prior echocardiogram showing ejection fraction of 20% with severe mitral regurgitation. Admission chest x-ray remarkable for pulmonary edema and an elevated proBNP of 41,000. Course complicated by demand ischemia (see below). Patient was found to have a central venous oxygenation of 57. Milrinone started with good response in renal function and volume status. Patient had 2 L of net output. -Appreciate cardiology  recommendations -Milrinone decreased to 0.125 mcg/kg/min -Lasix 80 mg twice a day IV (confirm with cardiology as orders not yet put in). -Continue with hydralazine and Imdur.  New onset atrial fibrillation: Patient converted to atrial fibrillation last night. Metoprolol 2.5 mg given once. Patient's rates remained in the low 100s. This morning, patient has reverted to normal sinus rhythm. -Metoprolol discontinued -Started Eliquis 2.5 mg twice a day per cardiology -Hold on amiodarone for now.  Coronary artery disease: [place] to care everywhere, patient had a late STEMI in October at [facility] and treated medically. Ejection fraction of 20% with severe mitral regurgitation. -Continue 10 units aspirin 81 mg and Plavix 75 mg daily  Hypotension: Pressure slightly improved now in the low 500B systolic.  -Continue hydralazine and Imdur as above.  Stage IV chronic kidney disease: Creatinine trending down, 1.89 today, likely in response to inotropes. -Continue to monitor with tenuous fluid status.  Elevated liver enzymes: AST of 140 and ALT of 127 with no priors in the  system. Hepatocellular pattern with no rise in alkaline phosphatase or bilirubin. Possibly secondary to hepatic congestion. -Repeat labs tomorrow morning.  Normocytic anemia: Hgb stable at 11.7.  -Continue to monitor  Diet: Heart Prophylaxis: heparin sq and SCDs Code: Full (confirmed with family that this is the current status, despite DO NOT RESUSCITATE records in the file)  Resolved: Leukocytosis Thrombocythemia Abdominal pain Demand ischemia Anion gap acidosis  Dispo: Disposition is deferred at this time, awaiting improvement of current medical problems. Anticipated discharge in approximately 3 day(s).   The patient does have a current PCP (No primary care provider on file.) and does need an [facility] hospital follow-up appointment after discharge.  The patient does have transportation limitations that hinder transportation to  clinic appointments.  LOS: 2 days   Luan Moore, MD 05/08/2014, 9:19 AM

## 2014-05-08 NOTE — Progress Notes (Signed)
  PROGRESS NOTE MEDICINE TEACHING ATTENDING   Day 2 of stay Patient name: Leah Kramer   Medical record number: [id number] Date of birth: 12/31/1923   Brief Summary and assessment plan  80 year old female with history of heart failure, recent STEMI (medical management), CKD, hypertenision,  and DM. Presented with pulmonary edema, recent echo (10/15) EF 20% and severe MR. On lasix and milrinone. Overnight, new onset atrial fibrillation bout, now back to sinus. Weight down 10 lbs, reports feeling improved. Renal function improved from yesterday. On exam, lungs clear, heart - normal S1, S2 with systolic murmur. No gallop, no JVD. Legs no edema. No cyanosis. Heart failure team on board, appreciate recommendations. Agree with Apixaban 2.5 mg for atrial fibrillation. Agree with discontinuation of BB given EF. Await new echo (done 05/06/14) results. Blood pressure is doing better.   I saw and evaluated the patient.  I personally confirmed the key portions of the history and exam documented by Dr. Raelene Bott and I reviewed pertinent patient test results.  The assessment, diagnosis, and plan were formulated together and I agree with the documentation in the resident's note.  [place], [place] 05/08/2014, 11:44 AM.

## 2014-05-08 NOTE — Plan of Care (Signed)
Problem: Consults Goal: Respiratory Problems Patient Education See Patient Education Module for education specifics.  Outcome: Completed/Met Date Met:  05/08/14 Goal: Skin Care Protocol Initiated - if Braden Score 18 or less If consults are not indicated, leave blank or document N/A  Outcome: Completed/Met Date Met:  05/08/14 Goal: Nutrition Consult-if indicated Outcome: Not Applicable Date Met:  89/21/19 Goal: Diabetes Guidelines if Diabetic/Glucose > 140 If diabetic or lab glucose is > 140 mg/dl - Initiate Diabetes/Hyperglycemia Guidelines & Document Interventions  Outcome: Not Applicable Date Met:  41/74/08  Problem: ICU Phase Progression Outcomes Goal: O2 sats trending toward baseline Outcome: Completed/Met Date Met:  05/08/14 Goal: Dyspnea controlled at rest Outcome: Completed/Met Date Met:  05/08/14 Goal: Hemodynamically stable Outcome: Completed/Met Date Met:  05/08/14 Goal: Pain controlled with appropriate interventions Outcome: Completed/Met Date Met:  05/08/14 Goal: Flu/PneumoVaccines if indicated Outcome: Completed/Met Date Met:  05/08/14 Goal: Initial discharge plan identified Outcome: Completed/Met Date Met:  05/08/14 Goal: Voiding-avoid urinary catheter unless indicated Outcome: Completed/Met Date Met:  05/08/14 Goal: Other ICU Phase Outcomes/Goals Outcome: Not Applicable Date Met:  14/48/18  Problem: Phase I Progression Outcomes Goal: O2 sats > or equal 90% or at baseline Outcome: Completed/Met Date Met:  05/08/14 Goal: Dyspnea controlled at rest Outcome: Completed/Met Date Met:  05/08/14 Goal: Hemodynamically stable Outcome: Completed/Met Date Met:  05/08/14

## 2014-05-08 NOTE — Progress Notes (Addendum)
Pt in afib. 110s-120s. Also that Pt not anticoagulated. Primary team made aware.

## 2014-05-08 NOTE — Clinical Social Work Psychosocial (Signed)
     Clinical Social Work Department BRIEF PSYCHOSOCIAL ASSESSMENT 05/08/2014  Patient:  Leah Kramer, Leah Kramer     Account Number:  [id number]     Admit date:  05/06/2014  Clinical Social Worker:  Adair Laundry  Date/Time:  05/08/2014 02:11 PM  Referred by:  Physician  Date Referred:  05/08/2014 Referred for  SNF Placement   Other Referral:   Interview type:  Patient Other interview type:   Spoke with pt, pt husband, and daughter at bedside    PSYCHOSOCIAL DATA Living Status:  FACILITY Admitted from facility:  Other Level of care:  [place] Primary support name:  [name] Primary support relationship to patient:  CHILD, ADULT Degree of support available:   Pt admitted from [employer]. Pt has strong family support    CURRENT CONCERNS Current Concerns  Post-Acute Placement   Other Concerns:    SOCIAL WORK ASSESSMENT / PLAN CSW visited pt room and spoke with pt, pt husband, and pt daughter at bedside. They all confirmed that pt was admitted from [place]. Pt informed pt that she was due to dc home by the end of this week but had a set back and ended up at the hospital. Pt informed CSW she has had a wonderful experience at [facility] but really is ready to return home. Pt wanting to be home by Christmas. Pt and pt husband did wonder if pt would be able to dc home from the hospital since plan was dc this week anyways. CSW notified family that PT recommendation would be given and family could choose what to do from there. All were in agreement that pt would want to go with recommendation not against just to return home sooner. Pt daughter expressed concerns about not having a bed hold. She informed CSW family had planned on holding the bed a couple of days but since it looks like pt will be at the hospital longer than expected they cannot continue to hold the bed. CSW notified family that CSW role includes providing regular updates to the facility.  This assists with helping facilities keep track of bed availability. Pt daughter thankful for this information. Pt, pt husband, and pt daughter all in good spirts and joking around throughout conversation.  CSW did speak with PT and confirmed they feel pt would benefit from continued rehab at [facility] before returning home. CSW electronic fax first update to facility today.   Assessment/plan status:  Psychosocial Support/Ongoing Assessment of Needs Other assessment/ plan:   Information/referral to community resources:   None needed at this time    PATIENTS/FAMILYS RESPONSE TO PLAN OF CARE: Pt, pt husband, and pt daughter all pleasant and coopeartive. Pt is agreeable to returning to SNF at dc if needed.    [place], [place]

## 2014-05-08 NOTE — Progress Notes (Signed)
Pt HR sustaining 120s-140s, afib. Primary team made aware. Orders received for IV metoprolol. Will continue to monitor.

## 2014-05-09 LAB — CBC
HCT: 37.7 % (ref 36.0–46.0)
Hemoglobin: 12.2 g/dL (ref 12.0–15.0)
MCH: 29.8 pg (ref 26.0–34.0)
MCHC: 32.4 g/dL (ref 30.0–36.0)
MCV: 92 fL (ref 78.0–100.0)
Platelets: 261 10*3/uL (ref 150–400)
RBC: 4.1 MIL/uL (ref 3.87–5.11)
RDW: 14.2 % (ref 11.5–15.5)
WBC: 7.5 10*3/uL (ref 4.0–10.5)

## 2014-05-09 LAB — BASIC METABOLIC PANEL
Anion gap: 14 (ref 5–15)
BUN: 35 mg/dL — ABNORMAL HIGH (ref 6–23)
CHLORIDE: 96 meq/L (ref 96–112)
CO2: 32 mEq/L (ref 19–32)
Calcium: 9.1 mg/dL (ref 8.4–10.5)
Creatinine, Ser: 1.88 mg/dL — ABNORMAL HIGH (ref 0.50–1.10)
GFR, EST AFRICAN AMERICAN: 26 mL/min — AB (ref >90)
GFR, EST NON AFRICAN AMERICAN: 23 mL/min — AB (ref >90)
GLUCOSE: 104 mg/dL — AB (ref 70–99)
POTASSIUM: 4.3 meq/L (ref 3.7–5.3)
SODIUM: 142 meq/L (ref 137–147)

## 2014-05-09 LAB — CARBOXYHEMOGLOBIN
CARBOXYHEMOGLOBIN: 1 % (ref 0.5–1.5)
Methemoglobin: 1.1 % (ref 0.0–1.5)
O2 Saturation: 56.7 %
Total hemoglobin: 12.6 g/dL (ref 12.0–16.0)

## 2014-05-09 MED ORDER — ISOSORBIDE MONONITRATE ER 30 MG PO TB24
30.0000 mg | ORAL_TABLET | Freq: Every day | ORAL | Status: DC
  Administered 2014-05-10 – 2014-05-11 (×2): 30 mg via ORAL
  Filled 2014-05-09 (×2): qty 1

## 2014-05-09 MED ORDER — POTASSIUM CHLORIDE CRYS ER 20 MEQ PO TBCR
20.0000 meq | EXTENDED_RELEASE_TABLET | Freq: Once | ORAL | Status: AC
  Administered 2014-05-09: 20 meq via ORAL
  Filled 2014-05-09: qty 1

## 2014-05-09 MED ORDER — HYDRALAZINE HCL 25 MG PO TABS
25.0000 mg | ORAL_TABLET | Freq: Three times a day (TID) | ORAL | Status: DC
  Administered 2014-05-09 – 2014-05-10 (×3): 25 mg via ORAL
  Filled 2014-05-09 (×9): qty 1

## 2014-05-09 NOTE — Progress Notes (Signed)
Subjective:   80 year old female with history of heart failure, chronic kidney disease, hypertension, diabetes, CAD.  Per Care Everywhere she presented to [facility] in October with late STEMI. Treated medically. EF 20% and severe MR. Has been struggling with volume management in setting on chronic renal failure.    TTE 03/2014 [facility]): Severe LV dysfunction (EF 20%) with basal inferior, basal anterospetal, and basal anterior hypokinesis, dilated LV, diastolic dysfunction, elevated LV filling pressures, degen MV with severe MR, dilated LA, moderate AR, severe pulmonary hypertension, normal RV function, mild TR, elevated CVP  Admitted 12/4 with respiratory failure due to recurrent HF. Troponin minimally elevated. Transferred to stepdown yesterday (12/6) and PICC placed. Co-ox initially 57% and milrinone 0.25 mcg started. Yesterday milrinone was cut back to 0.125 mcg due to A fib. Converted spontaneously. Maintaining NSR. Weight down another 3 pounds.  CVP 1-2 Creatinine 1.88   Intake/Output Summary (Last 24 hours) at 05/09/14 0703 Last data filed at 05/09/14 0500  Gross per 24 hour  Intake  821.2 ml  Output   2725 ml  Net -1903.8 ml    Current meds: . apixaban  2.5 mg Oral BID  . clopidogrel  75 mg Oral q morning - 10a  . furosemide  80 mg Intravenous BID  . hydrALAZINE  12.5 mg Oral 3 times per day  . isosorbide mononitrate  15 mg Oral Daily  . metolazone  5 mg Oral Daily  . simvastatin  20 mg Oral Daily  . sodium chloride  10-40 mL Intracatheter Q12H   Infusions: . sodium chloride 8 mL/hr at 05/08/14 1100  . milrinone 0.125 mcg/kg/min (05/08/14 1833)     Objective:  Blood pressure 103/64, pulse 74, temperature 97.9 F (36.6 C), temperature source Oral, resp. rate 22, weight 115 lb 11.9 oz (52.5 kg), SpO2 96 %. Weight change: -2 lb 13.9 oz (-1.3 kg)  Physical Exam:  General: Elderly, sitting in chair NAD Neck: JVP flat Head: Eyes PERRLA, No xanthomas. Normal cephalic and  atramatic Lungs:Decreased in the bases on 2 liters .   Heart:Regular rate and rhythm, S1 S2 Pulses are 2+ & equal. 2/6 MR +s3 No carotid bruit.  No abdominal bruits.  Abdomen: Bowel sounds are positive, abdomen soft and non-tender without masses. No hepatosplenomegaly. Mildly distended Msk: Back normal. Appears weakened Extremities: No clubbing, cyanosis, no edema. DP +1 Neuro: Alert and oriented X 3, non-focal, MAE x 4 Psych: Good affect, responds appropriately  Telemetry: SR 80s  Lab Results: Basic Metabolic Panel:  Recent Labs Lab 05/06/14 0840 05/07/14 0121 05/07/14 2030 05/08/14 0545 05/09/14 0330  NA 140 142 140 143 142  K 4.1 3.8 3.3* 3.7 4.3  CL 96 100 97 97 96  CO2 21 25 30  32 32  GLUCOSE 312* 110* 156* 125* 104*  BUN 38* 39* 40* 38* 35*  CREATININE 1.98* 1.99* 2.03* 1.89* 1.88*  CALCIUM 9.0 8.5 8.6 8.7 9.1   Liver Function Tests:  Recent Labs Lab 05/06/14 0840  AST 140*  ALT 127*  ALKPHOS 55  BILITOT 1.1  PROT 6.5  ALBUMIN 3.4*    Recent Labs Lab 05/06/14 0840  LIPASE 33   No results for input(s): AMMONIA in the last 168 hours. CBC:  Recent Labs Lab 05/06/14 0840 05/07/14 0121 05/08/14 0545 05/09/14 0330  WBC 15.8* 7.9 7.0 7.5  NEUTROABS 13.4*  --   --   --   HGB 13.1 11.1* 11.7* 12.2  HCT 41.1 33.4* 35.6* 37.7  MCV 93.8 92.0 94.2 92.0  PLT 430* 253 251 261   Cardiac Enzymes:  Recent Labs Lab 05/06/14 0840 05/06/14 1346 05/06/14 1900 05/07/14 0121  TROPONINI <0.30 0.62* 0.42* 0.34*   BNP: Invalid input(s): POCBNP CBG: No results for input(s): GLUCAP in the last 168 hours. Microbiology: No results found for: CULT No results for input(s): CULT, SDES in the last 168 hours.  Imaging: Dg Chest Port 1 View  05/07/2014   CLINICAL DATA:  PICC line placement  EXAM: PORTABLE CHEST - 1 VIEW  COMPARISON:  05/06/2014  FINDINGS: Right-sided PICC line with the tip projecting over the right atrium. Recommend retracting  the PICC line 4 cm.  Bilateral small pleural effusions, left greater than right. Mild bilateral interstitial thickening. No pneumothorax. Stable cardiomediastinal silhouette. No acute osseous abnormality.  IMPRESSION: 1. Right-sided PICC line with the tip projecting over the right atrium. Recommend retracting the PICC line 4 cm. 2. Stable CHF.   Electronically Signed   By: Kathreen Devoid   On: 05/07/2014 16:57     ASSESSMENT:  1. Acute respiratory failure 2. Acute on chronic systolic HF (EF 66%) 3. CKD, stage 4 4. Severe MR 5. CAD s/p recent STEMI at [facility] 10/15 - late presenting. Treated medically 6. Elevated troponin  7. Atrial fibrillation  PLAN/DISCUSSION:  She was admitted for significant volume overload with probable low output HF in the setting of EF 20% and severe MR. Management complicated by severe CKD with GFR <20. Long talk with patient and family about severity of her condition and the heart-kidney interaction.  12/6 transferred to stepdown and had PICC placed. Initial co-ox 57% and she was started on milrinone 0.25 mcg. Co-ox this am 56% on milrinone 0.125 mcg. Volume status improving .Renal function ok at her baseline.   Continue current dose hyral/nitrates for afterload reduction. SBP soft so will not up titrate. . Not candidate for b-blocker d/t A/C HF.  No ACE-I or ARB d/t CKD.   Maintaining SR. On apixaban 2.5 mg twice a day.   Per OT--> needs SNF.    LOS: 3 days  CLEGG,[name], NP-C 05/09/2014, 7:03 AM  Patient seen and examined with Darrick Grinder, NP. We discussed all aspects of the encounter. I agree with the assessment and plan as stated above.   Much improved after 13 pound diuresis. CVP now 1-2. Will stop lasix and wean milrinone to off. Resume po lasix tomorrow. Continue to titrate hydralazine and nitrates as BP tolerates.   Long talk with patient and family about situation. Important points:  1) She has had a very big heart attack and now has significant cardiac and  renal dysfunction. Based on low-output physiology at presentation her mortality is ~50% at 6 months. She has been very healthy all her life and I think they are having trouble understanding how much this may affect her mortality. 2) We are trying to keep her off milrinone. However if we cannot maintain euvolemia without it. She may need home milrinone and this would preclude going to SNF as they will not accept patients on milrinone.  3) Over the next few days we will titrate meds and see if we can keep her volume status acceptable without her renal function getting worse. This will take a few days.   Georgana Romain,MD 11:30 AM

## 2014-05-09 NOTE — Plan of Care (Signed)
Problem: Phase I Progression Outcomes Goal: Progress activity as tolerated unless otherwise ordered Outcome: Progressing

## 2014-05-09 NOTE — Progress Notes (Addendum)
Subjective:  Patient continues to report feeling well this morning. Patient has also continued with good urine output. Patient is reporting that if she is feeling much better than she was in the weeks leading up to her admission.  Objective: Vital signs in last 24 hours: Filed Vitals:   05/09/14 0300 05/09/14 0354 05/09/14 0714 05/09/14 0800  BP:   119/54 113/53  Pulse: 74  84   Temp: 97.9 F (36.6 C)  98 F (36.7 C)   TempSrc: Oral  Oral   Resp: 22  21 19   Weight:  115 lb 11.9 oz (52.5 kg)    SpO2: 96%  97% 94%   Weight change: -2 lb 13.9 oz (-1.3 kg)  Intake/Output Summary (Last 24 hours) at 05/09/14 1016 Last data filed at 05/09/14 0900  Gross per 24 hour  Intake  712.6 ml  Output   2925 ml  Net -2212.4 ml   General: resting in bed HEENT: PERRL, EOMI, no scleral icterus, nasal cannula in place, alert and oriented 4 Cardiac: RRR, 2/6 systolic murmur, S3, no rubs, murmurs or gallops, JVD to the level of the mandible Pulm: Clear to auscultation bilaterally, no wheezes rhonchi or rales Abd: soft, nontender, nondistended, BS present Ext: warm and well perfused, no pedal edema Neuro: alert and oriented X3, cranial nerves II-XII grossly intact Skin: no rashes or lesions noted Psych: appropriate affect  Lab Results: Basic Metabolic Panel:  Recent Labs Lab 05/08/14 0545 05/09/14 0330  NA 143 142  K 3.7 4.3  CL 97 96  CO2 32 32  GLUCOSE 125* 104*  BUN 38* 35*  CREATININE 1.89* 1.88*  CALCIUM 8.7 9.1   Liver Function Tests:  Recent Labs Lab 05/06/14 0840  AST 140*  ALT 127*  ALKPHOS 55  BILITOT 1.1  PROT 6.5  ALBUMIN 3.4*    Recent Labs Lab 05/06/14 0840  LIPASE 33   No results for input(s): AMMONIA in the last 168 hours. CBC:  Recent Labs Lab 05/06/14 0840  05/08/14 0545 05/09/14 0330  WBC 15.8*  < > 7.0 7.5  NEUTROABS 13.4*  --   --   --   HGB 13.1  < > 11.7* 12.2  HCT 41.1  < > 35.6* 37.7  MCV 93.8  < > 94.2 92.0  PLT 430*  < > 251  261  < > = values in this interval not displayed. Cardiac Enzymes:  Recent Labs Lab 05/06/14 1346 05/06/14 1900 05/07/14 0121  TROPONINI 0.62* 0.42* 0.34*   BNP:  Recent Labs Lab 05/06/14 0840  PROBNP 41228.0*   D-Dimer: No results for input(s): DDIMER in the last 168 hours. CBG: No results for input(s): GLUCAP in the last 168 hours. Hemoglobin A1C: No results for input(s): HGBA1C in the last 168 hours. Fasting Lipid Panel: No results for input(s): CHOL, HDL, LDLCALC, TRIG, CHOLHDL, LDLDIRECT in the last 168 hours. Thyroid Function Tests: No results for input(s): TSH, T4TOTAL, FREET4, T3FREE, THYROIDAB in the last 168 hours. Coagulation:  Recent Labs Lab 05/06/14 0840  LABPROT 15.2  INR 1.18   Anemia Panel: No results for input(s): VITAMINB12, FOLATE, FERRITIN, TIBC, IRON, RETICCTPCT in the last 168 hours. Urine Drug Screen: Drugs of Abuse  No results found for: LABOPIA, COCAINSCRNUR, LABBENZ, AMPHETMU, THCU, LABBARB  Alcohol Level: No results for input(s): ETH in the last 168 hours. Urinalysis:  Recent Labs Lab 05/06/14 1010  COLORURINE YELLOW  LABSPEC 1.013  PHURINE 5.5  GLUCOSEU NEGATIVE  HGBUR NEGATIVE  BILIRUBINUR NEGATIVE  KETONESUR NEGATIVE  PROTEINUR 30*  UROBILINOGEN 0.2  NITRITE NEGATIVE  LEUKOCYTESUR TRACE*   Micro Results: Recent Results (from the past 240 hour(s))  MRSA PCR Screening     Status: None   Collection Time: 05/06/14  5:22 PM  Result Value Ref Range Status   MRSA by PCR NEGATIVE NEGATIVE Final    Comment:        The GeneXpert MRSA Assay (FDA approved for NASAL specimens only), is one component of a comprehensive MRSA colonization surveillance program. It is not intended to diagnose MRSA infection nor to guide or monitor treatment for MRSA infections.    Studies/Results: Dg Chest Port 1 View  05/07/2014   CLINICAL DATA:  PICC line placement  EXAM: PORTABLE CHEST - 1 VIEW  COMPARISON:  05/06/2014  FINDINGS:  Right-sided PICC line with the tip projecting over the right atrium. Recommend retracting the PICC line 4 cm.  Bilateral small pleural effusions, left greater than right. Mild bilateral interstitial thickening. No pneumothorax. Stable cardiomediastinal silhouette. No acute osseous abnormality.  IMPRESSION: 1. Right-sided PICC line with the tip projecting over the right atrium. Recommend retracting the PICC line 4 cm. 2. Stable CHF.   Electronically Signed   By: Kathreen Devoid   On: 05/07/2014 16:57   Medications: I have reviewed the patient's current medications. Scheduled Meds: . apixaban  2.5 mg Oral BID  . clopidogrel  75 mg Oral q morning - 10a  . furosemide  80 mg Intravenous BID  . hydrALAZINE  12.5 mg Oral 3 times per day  . isosorbide mononitrate  15 mg Oral Daily  . metolazone  5 mg Oral Daily  . potassium chloride  20 mEq Oral Once  . simvastatin  20 mg Oral Daily  . sodium chloride  10-40 mL Intracatheter Q12H   Continuous Infusions: . sodium chloride 8 mL/hr at 05/08/14 1100  . milrinone 0.125 mcg/kg/min (05/08/14 1833)   PRN Meds:.sodium chloride Assessment/Plan: Principal Problem:   Acute respiratory failure Active Problems:   Acute on chronic systolic congestive heart failure   Severe mitral regurgitation   Chronic renal failure, stage 4 (severe)   Coronary artery disease due to lipid rich plaque   Congestive heart disease  Patient is an 80 year old female with a history of chronic kidney disease, congestive heart failure, hypertension, diabetes, overactive bladder, and hyperlipidemia who is admitted for acute decompensated heart failure.  Acute decompensated congestive heart failure as part of cardiorenal syndrome: Echocardiogram from this admission showing a left ventricular ejection fraction of 10-15% with diffuse akinesis and cavity size dilation along with mild to moderate regurgitation. Pulmonary artery peak pressure of 53 mmHg. Central venous oxygenation has  remained stable in the mid 27s. Patient was able to have another 2.7 L of net output over the last 24 hours. Patient is still on 4 L nasal cannula. -Appreciate cardiology recommendations -Continue with milrinone at 0.125 mcg/kg/min -Continue with metolazone 5 mg daily -Continue with Lasix 80 mg twice a day IV -Continue with hydralazine 12.5 mg every 8 hours and Imdur 15 mg daily  New onset atrial fibrillation: Patient with a six-hour episode of atrial fibrillation on the morning of 05/08/2014. Patient spontaneously reverted to normal sinus rhythm with no recurrent episodes. -Continue Eliquis 2.5 mg twice a day per cardiology  Coronary artery disease: Patient with a STEMI in October and managed medically at [facility] secondary to concerns about renal function. -Continue with aspirin 81 mg and Plavix 75 mg daily  Hypotension: Pressures are slightly improved  now in the 782N systolic over 56O diastolic. -Continue with hydralazine and Imdur.  Stage IV chronic kidney disease: Creatinine is stable compared to yesterday, will 1.88, though down from admission of 2.03. Suspect some benefit of increased perfusion secondary to inotropes.   Diet: Heart Prophylaxis: heparin sq and SCDs Code: Full (confirmed with family that this is the current status, despite DO NOT RESUSCITATE records in the file)  Resolved/stabilized: Leukocytosis Thrombocythemia Abdominal pain Demand ischemia Anion gap acidosis  Normocytic anemia  Dispo: Occupational therapy recommending skilled nursing facility. Disposition is deferred at this time, awaiting improvement of current medical problems. Anticipated discharge in approximately 3 day(s).   The patient does have a current PCP (No primary care provider on file.) and does need an [facility] hospital follow-up appointment after discharge.  The patient does have transportation limitations that hinder transportation to clinic appointments.  LOS: 3 days   Luan Moore, MD 05/09/2014,  10:16 AM

## 2014-05-09 NOTE — Plan of Care (Signed)
Problem: Phase I Progression Outcomes Goal: Discharge plan established Outcome: Progressing     

## 2014-05-09 NOTE — Discharge Instructions (Addendum)
Please follow-up with heart failure clinic in a week. They will be in contact with you regarding an appointment.   Information on my medicine - ELIQUIS (apixaban)  This medication education was reviewed with me or my healthcare representative as part of my discharge preparation.  The pharmacist that spoke with me during my hospital stay was:  Georgina Peer, [facility]  Why was Eliquis prescribed for you? Eliquis was prescribed for you to reduce the risk of a blood clot forming that can cause a stroke if you have a medical condition called atrial fibrillation (a type of irregular heartbeat).  What do You need to know about Eliquis ? Take your Eliquis TWICE DAILY - one tablet in the morning and one tablet in the evening with or without food. If you have difficulty swallowing the tablet whole please discuss with your pharmacist how to take the medication safely.  Take Eliquis exactly as prescribed by your doctor and DO NOT stop taking Eliquis without talking to the doctor who prescribed the medication.  Stopping may increase your risk of developing a stroke.  Refill your prescription before you run out.  After discharge, you should have regular check-up appointments with your healthcare provider that is prescribing your Eliquis.  In the future your dose may need to be changed if your kidney function or weight changes by a significant amount or as you get older.  What do you do if you miss a dose? If you miss a dose, take it as soon as you remember on the same day and resume taking twice daily.  Do not take more than one dose of ELIQUIS at the same time to make up a missed dose.  Important Safety Information A possible side effect of Eliquis is bleeding. You should call your healthcare provider right away if you experience any of the following: ? Bleeding from an injury or your nose that does not stop. ? Unusual colored urine (red or dark brown) or unusual colored stools (red or  black). ? Unusual bruising for unknown reasons. ? A serious fall or if you hit your head (even if there is no bleeding).  Some medicines may interact with Eliquis and might increase your risk of bleeding or clotting while on Eliquis. To help avoid this, consult your healthcare provider or pharmacist prior to using any new prescription or non-prescription medications, including herbals, vitamins, non-steroidal anti-inflammatory drugs (NSAIDs) and supplements.  This website has more information on Eliquis (apixaban): http://www.eliquis.com/eliquis/home

## 2014-05-09 NOTE — Plan of Care (Signed)
Problem: Phase I Progression Outcomes Goal: Tolerating diet Outcome: Completed/Met Date Met:  05/09/14     

## 2014-05-09 NOTE — Progress Notes (Signed)
CARDIAC REHAB PHASE I   PRE:  Rate/Rhythm: 98 SR  BP:  Supine:   Sitting: 97/36  Standing:    SaO2: 92%RA  MODE:  Ambulation: 210 ft   POST:  Rate/Rhythm: 106 ST  BP:  Supine:   Sitting: 106/53  Standing:    SaO2: 94-95%RA hall, 95% RA room 1435-1504 Pt did well off oxygen. Walked 210 ft on RA with gait belt use and rolling walker, stopping once to rest. Tolerated well. Tired by end of walk. Left off oxygen and sats maintaining 93% and above. To recliner with call bell.   Graylon Good, RN BSN  05/09/2014 2:59 PM

## 2014-05-10 DIAGNOSIS — I959 Hypotension, unspecified: Secondary | ICD-10-CM | POA: Insufficient documentation

## 2014-05-10 DIAGNOSIS — N289 Disorder of kidney and ureter, unspecified: Secondary | ICD-10-CM | POA: Insufficient documentation

## 2014-05-10 LAB — CBC
HCT: 36.6 % (ref 36.0–46.0)
Hemoglobin: 12.1 g/dL (ref 12.0–15.0)
MCH: 30.8 pg (ref 26.0–34.0)
MCHC: 33.1 g/dL (ref 30.0–36.0)
MCV: 93.1 fL (ref 78.0–100.0)
PLATELETS: 253 10*3/uL (ref 150–400)
RBC: 3.93 MIL/uL (ref 3.87–5.11)
RDW: 13.9 % (ref 11.5–15.5)
WBC: 5.8 10*3/uL (ref 4.0–10.5)

## 2014-05-10 LAB — TROPONIN I

## 2014-05-10 LAB — COMPREHENSIVE METABOLIC PANEL
ALBUMIN: 2.9 g/dL — AB (ref 3.5–5.2)
ALT: 49 U/L — ABNORMAL HIGH (ref 0–35)
AST: 29 U/L (ref 0–37)
Alkaline Phosphatase: 52 U/L (ref 39–117)
Anion gap: 12 (ref 5–15)
BUN: 33 mg/dL — ABNORMAL HIGH (ref 6–23)
CALCIUM: 9 mg/dL (ref 8.4–10.5)
CO2: 31 mEq/L (ref 19–32)
Chloride: 93 mEq/L — ABNORMAL LOW (ref 96–112)
Creatinine, Ser: 1.91 mg/dL — ABNORMAL HIGH (ref 0.50–1.10)
GFR calc Af Amer: 26 mL/min — ABNORMAL LOW (ref >90)
GFR calc non Af Amer: 22 mL/min — ABNORMAL LOW (ref >90)
Glucose, Bld: 108 mg/dL — ABNORMAL HIGH (ref 70–99)
Potassium: 4.1 mEq/L (ref 3.7–5.3)
Sodium: 136 mEq/L — ABNORMAL LOW (ref 137–147)
Total Bilirubin: 0.8 mg/dL (ref 0.3–1.2)
Total Protein: 5.7 g/dL — ABNORMAL LOW (ref 6.0–8.3)

## 2014-05-10 LAB — CARBOXYHEMOGLOBIN
CARBOXYHEMOGLOBIN: 1.4 % (ref 0.5–1.5)
Methemoglobin: 0.9 % (ref 0.0–1.5)
O2 SAT: 64.9 %
TOTAL HEMOGLOBIN: 12.1 g/dL (ref 12.0–16.0)

## 2014-05-10 MED ORDER — MORPHINE SULFATE 2 MG/ML IJ SOLN
2.0000 mg | Freq: Once | INTRAMUSCULAR | Status: AC
  Administered 2014-05-10: 2 mg via INTRAVENOUS
  Filled 2014-05-10: qty 1

## 2014-05-10 MED ORDER — MORPHINE SULFATE 2 MG/ML IJ SOLN: 1.0000 mg | INTRAMUSCULAR | Status: DC | PRN

## 2014-05-10 MED ORDER — ASPIRIN 81 MG PO CHEW
162.0000 mg | CHEWABLE_TABLET | Freq: Once | ORAL | Status: AC
  Administered 2014-05-10: 162 mg via ORAL
  Filled 2014-05-10: qty 2

## 2014-05-10 MED ORDER — TORSEMIDE 20 MG PO TABS
40.0000 mg | ORAL_TABLET | Freq: Every day | ORAL | Status: DC
  Administered 2014-05-10 – 2014-05-11 (×2): 40 mg via ORAL
  Filled 2014-05-10 (×2): qty 2

## 2014-05-10 NOTE — Progress Notes (Signed)
CARDIAC REHAB PHASE I   PRE:  Rate/Rhythm: 95 SR pacs  BP:  Supine:   Sitting: 97/48  Standing:    SaO2: 94 % 2L,  94%RA  MODE:  Ambulation: 170 ft   POST:  Rate/Rhythm: 100 SR   BP:  Supine:   Sitting: 102/41  Standing:    SaO2: 91% and above on RA . Never below 91% whole walk 1110-1156 Pt denied CP. Took on oxygen and checked sats when getting pt to [facility]. Sats maintained above 91% so walked pt off oxygen. Pt walked 170 ft on RA with gait belt use and asst x 1. Monitored sats whole walk and remained 91% and above. Pt c/o legs tired and did not want to go farther. Denied CP or SOB. To recliner with call bell. Left off oxygen and pt to call RN if she gets SOB. Family in room.   Graylon Good, RN BSN  05/10/2014 11:50 AM

## 2014-05-10 NOTE — Progress Notes (Addendum)
Subjective:   80 year old female with history of heart failure, chronic kidney disease, hypertension, diabetes, CAD.  Per Care Everywhere she presented to [facility] in October with late STEMI. Treated medically. EF 20% and severe MR. Has been struggling with volume management in setting on chronic renal failure.    TTE 03/2014 [facility]): Severe LV dysfunction (EF 20%) with basal inferior, basal anterospetal, and basal anterior hypokinesis, dilated LV, diastolic dysfunction, elevated LV filling pressures, degen MV with severe MR, dilated LA, moderate AR, severe pulmonary hypertension, normal RV function, mild TR, elevated CVP  Admitted 12/4 with respiratory failure due to recurrent HF. Troponin minimally elevated. Transferred to stepdown yesterday (12/6) and PICC placed. Co-ox initially 57% and milrinone 0.25 mcg started. Remains on milrinone 0.125 mcg. Weight stable and denies any SOB, orthopnea or CP. Maintaining SR. Ambulating in the halls about 200 ft.   CVP 5-6 Creatinine 1.88>1.91 Co-ox- 65%  Intake/Output Summary (Last 24 hours) at 05/10/14 0733 Last data filed at 05/10/14 0600  Gross per 24 hour  Intake    955 ml  Output   1725 ml  Net   -770 ml    Current meds: . apixaban  2.5 mg Oral BID  . hydrALAZINE  25 mg Oral 3 times per day  . isosorbide mononitrate  30 mg Oral Daily  . simvastatin  20 mg Oral Daily  . sodium chloride  10-40 mL Intracatheter Q12H   Infusions: . sodium chloride 8 mL/hr at 05/08/14 1100  . milrinone 0.125 mcg/kg/min (05/09/14 1733)     Objective:  Blood pressure 94/67, pulse 85, temperature 98.1 F (36.7 C), temperature source Oral, resp. rate 18, weight 115 lb 4.8 oz (52.3 kg), SpO2 93 %. Weight change: -7.1 oz (-0.2 kg)  Physical Exam:  General: Elderly, lying flat in bed Neck: JVP flat Head: Eyes PERRLA, No xanthomas. Normal cephalic and atramatic Lungs:Decreased in the bases on 2 liters [place].   Heart:Regular rate and rhythm, S1 S2  Pulses are 2+ & equal. 2/6 MR +s3 No carotid bruit.  No abdominal bruits.  Abdomen: Bowel sounds are positive, abdomen soft and non-tender without masses. No hepatosplenomegaly. Mildly distended Msk: Back normal. Appears weakened Extremities: No clubbing, cyanosis, no edema. DP +1 Neuro: Alert and oriented X 3, non-focal, MAE x 4 Psych: Good affect, responds appropriately  Telemetry: SR 80s  Lab Results: Basic Metabolic Panel:  Recent Labs Lab 05/07/14 0121 05/07/14 2030 05/08/14 0545 05/09/14 0330 05/10/14 0330  NA 142 140 143 142 136*  K 3.8 3.3* 3.7 4.3 4.1  CL 100 97 97 96 93*  CO2 25 30 32 32 31  GLUCOSE 110* 156* 125* 104* 108*  BUN 39* 40* 38* 35* 33*  CREATININE 1.99* 2.03* 1.89* 1.88* 1.91*  CALCIUM 8.5 8.6 8.7 9.1 9.0   Liver Function Tests:  Recent Labs Lab 05/06/14 0840 05/10/14 0330  AST 140* 29  ALT 127* 49*  ALKPHOS 55 52  BILITOT 1.1 0.8  PROT 6.5 5.7*  ALBUMIN 3.4* 2.9*    Recent Labs Lab 05/06/14 0840  LIPASE 33   No results for input(s): AMMONIA in the last 168 hours. CBC:  Recent Labs Lab 05/06/14 0840 05/07/14 0121 05/08/14 0545 05/09/14 0330 05/10/14 0330  WBC 15.8* 7.9 7.0 7.5 5.8  NEUTROABS 13.4*  --   --   --   --   HGB 13.1 11.1* 11.7* 12.2 12.1  HCT 41.1 33.4* 35.6* 37.7 36.6  MCV 93.8 92.0 94.2 92.0 93.1  PLT 430* 253  251 261 253   Cardiac Enzymes:  Recent Labs Lab 05/06/14 0840 05/06/14 1346 05/06/14 1900 05/07/14 0121  TROPONINI <0.30 0.62* 0.42* 0.34*   BNP: Invalid input(s): POCBNP CBG: No results for input(s): GLUCAP in the last 168 hours. Microbiology: No results found for: CULT No results for input(s): CULT, SDES in the last 168 hours.  Imaging: No results found.   ASSESSMENT:  1. Acute respiratory failure 2. Acute on chronic systolic HF (EF 31%) 3. CKD, stage 4 4. Severe MR 5. CAD s/p recent STEMI at [facility] 10/15 - late presenting. Treated medically 6. Elevated troponin  7. Atrial  fibrillation  PLAN/DISCUSSION:  She was admitted for significant volume overload with probable low output HF in the setting of EF 20% and severe MR. Management complicated by severe CKD with GFR <20. Long talk with patient and family about severity of her condition and the heart-kidney interaction.  12/6 transferred to stepdown and had PICC placed. Initial co-ox 57% and she was started on milrinone 0.25 mcg. Remains on milrinone 0.125 mcg and co-ox this am 65%. Will stop milrinone and see how she does. Would prefer to not send her home with milrinone but will need to see if she can maintain euvolemia once stopped. CVP 5-6 this am will continue to hold diuretics another day and will probably start PO lasix 60 mg BID tomorrow. Renal function at baseline and will continue to follow.   Continue current dose hyral/nitrates for afterload reduction. SBP soft so will not up titrate. . Not candidate for b-blocker d/t A/C HF.  No ACE-I or ARB d/t CKD.   Maintaining SR. On apixaban 2.5 mg twice a day.   Per OT--> needs SNF. If she can tolerate being off milrinone this will not be an issue, however if she is going to need home milrinone she will not be able to go to SNF and would need HHRN and PT. Dr. Haroldine Laws discussed in length with family her prognosis and the options.    LOS: 4 days  Rande Brunt, NP-C 05/10/2014, 7:33 AM    Patient seen and examined with Junie Bame, NP. We discussed all aspects of the encounter. I agree with the assessment and plan as stated above.   Improved this am. Weight stable. Co-ox better. CVP 6-7 range. Stop milrinone. Give torsemide 40 daily starting this afternoon. Continue hydralazine 25 tid/indur 30 with hold parameters SBP < 90. If stable may be able to go to SNF tomorrow. No b-blocker or ACE yet.   Saraann Enneking,MD 9:13 AM  Addendum:  Patient developed CP after milrinone stopped. ECG with alteral TWI. Unclear if these are new as previous ECGs are inadequate.  Will treat with ASA and morphine. Cycle troponins. On apixaban so will not start heparin at this point.   Benay Spice 9:33 AM

## 2014-05-10 NOTE — Progress Notes (Signed)
CSW (Clinical Education officer, museum) notified by facility that they can accept pt back tomorrow or Friday if pt is medically ready for dc. They also confirmed they can accept pt over the weekend if needed. They are requesting they be notified as soon as possible if pt will potentially dc over weekend.  [place], [place]

## 2014-05-10 NOTE — Progress Notes (Signed)
  PROGRESS NOTE MEDICINE TEACHING ATTENDING   Day 4 of stay Patient name: Leah Kramer   Medical record number: [id number] Date of birth: 11-08-1923    Interim Event: Chest pain this morning, better with morphine. Otherwise feels better. Met with patient and her daughter.   Vitals: Blood pressure 84/51, pulse 89, temperature 97.4 F (36.3 C), temperature source Oral, resp. rate 21, weight 115 lb 4.8 oz (52.3 kg), SpO2 95 %.  Brief exam: Lungs clear, Heart - [id number] normal, systolic murmur, no gallop. No pedal edema.   Scheduled Medications . apixaban  2.5 mg Oral BID  . hydrALAZINE  25 mg Oral 3 times per day  . isosorbide mononitrate  30 mg Oral Daily  . simvastatin  20 mg Oral Daily  . sodium chloride  10-40 mL Intracatheter Q12H  . torsemide  40 mg Oral Daily    PRN Medications morphine injection, sodium chloride  Plan: Heart Failure team stopped milrinone today. Patient on apixaban. Discussed with Dr Jeffie Pollock about possibilities of hemorrhage and he mentions stopping Aspirin. Appreciate Heart Failure teams inputs. We will continue to monitor and arrange for SNF as patient progresses. I have discussed the care of this patient with my IM team residents. Please see the resident note for details.  [place], [place] 05/10/2014, 11:54 AM.

## 2014-05-10 NOTE — Progress Notes (Signed)
Physical Therapy Treatment Patient Details Name: Leah Kramer MRN: [id number] DOB: 01-Sep-1923 Today's Date: 05/10/2014    History of Present Illness Pt adm with respiratory failure. Pt with acute on chronic renal failure and stage 4 kidney failure. Pt also with new onset a-fib with RVR. PMH - recent STEMI, DM, HTN    PT Comments    Pt progressing well with mobility, ambulated 500' with RW and min-guard A as well as performing balance and strengthening exercises. Pt's VSS on RA. PT will continue to follow.   Follow Up Recommendations  SNF     Equipment Recommendations  None recommended by PT    Recommendations for Other Services       Precautions / Restrictions Precautions Precautions: Fall Restrictions Weight Bearing Restrictions: No    Mobility  Bed Mobility               General bed mobility comments: pt in chair upon arrival  Transfers Overall transfer level: Needs assistance Equipment used: Rolling walker (2 wheeled) Transfers: Sit to/from Stand Sit to Stand: Supervision         General transfer comment: pt with sufficient power for sit to stand from recliner without physical assist today  Ambulation/Gait Ambulation/Gait assistance: Min guard Ambulation Distance (Feet): 500 Feet Assistive device: Rolling walker (2 wheeled) Gait Pattern/deviations: Step-through pattern;Decreased step length - right;Decreased step length - left;Trunk flexed Gait velocity: decreased with decreased ability to change pace   General Gait Details: worked on Advertising account planner with ambulation, walking bkwds, quick stopping, and turns   Financial trader [place] (Stroke Patients Only)       Balance Overall balance assessment: Needs assistance Sitting-balance support: No upper extremity supported Sitting balance-Leahy Scale: Good     Standing balance support: No upper extremity supported Standing balance-Leahy Scale: Fair Standing  balance comment: worked on standing inside [place] but letting go and maintaining balance without UE support, was challenging for pt, could maintain 5-10 secs. Also worked on [employer] and tandem stance with UE support             High level balance activites: Backward walking;Direction changes;Sudden stops;Turns      Cognition Arousal/Alertness: Awake/alert Behavior During Therapy: WFL for tasks assessed/performed Overall Cognitive Status: Within Functional Limits for tasks assessed                      Exercises General Exercises - Lower Extremity Long Arc Quad: AROM;Both;10 reps;Seated Hip Flexion/Marching: AROM;Both;10 reps;Seated;Standing Heel Raises: AROM;Both;10 reps;Standing Mini-Sqauts: AROM;Both;15 reps;Standing    General Comments        Pertinent Vitals/Pain Pain Assessment: No/denies pain  O2 sats 94% on RA, HR 89 bpm    Home Living                      Prior Function            PT Goals (current goals can now be found in the care plan section) Acute Rehab PT Goals Patient Stated Goal: Return home PT Goal Formulation: With patient Time For Goal Achievement: 05/15/14 Potential to Achieve Goals: Good Progress towards PT goals: Progressing toward goals    Frequency  Min 3X/week    PT Plan Current plan remains appropriate    Co-evaluation             End of Session Equipment Utilized During Treatment: Gait belt Activity  Tolerance: Patient tolerated treatment well Patient left: in chair;with call bell/phone within reach;with family/visitor present     Time: 1210-1234 PT Time Calculation (min) (ACUTE ONLY): 24 min  Charges:  $Gait Training: 8-22 mins $Therapeutic Exercise: 8-22 mins                    G Codes:     Leighton Roach, PT  Acute Rehab Services  [place], [place] 05/10/2014, 1:45 PM

## 2014-05-10 NOTE — Progress Notes (Addendum)
Subjective:  Patient again reports feeling well this morning. Patient is denying any complaints.   Objective: Vital signs in last 24 hours: Filed Vitals:   05/10/14 0300 05/10/14 0400 05/10/14 0500 05/10/14 0600  BP: 95/42 98/52 98/81  94/67  Pulse: 76 75 103 85  Temp: 98.1 F (36.7 C)     TempSrc: Oral     Resp: 15 20 23 18   Weight: 115 lb 4.8 oz (52.3 kg)     SpO2: 97% 93% 96% 93%   Weight change: -7.1 oz (-0.2 kg)  Intake/Output Summary (Last 24 hours) at 05/10/14 0837 Last data filed at 05/10/14 0600  Gross per 24 hour  Intake    955 ml  Output   1725 ml  Net   -770 ml   General: resting in bed HEENT: PERRL, EOMI, no scleral icterus, nasal cannula in place, alert and oriented 4 Cardiac: RRR, 2/6 systolic murmur, S3, no rubs, murmurs or gallops Pulm: Clear to auscultation bilaterally, no wheezes rhonchi or rales Abd: soft, nontender, nondistended, BS present Ext: warm and well perfused, no pedal edema Neuro: alert and oriented X3, cranial nerves II-XII grossly intact Skin: no rashes or lesions noted Psych: appropriate affect  Lab Results: Basic Metabolic Panel:  Recent Labs Lab 05/09/14 0330 05/10/14 0330  NA 142 136*  K 4.3 4.1  CL 96 93*  CO2 32 31  GLUCOSE 104* 108*  BUN 35* 33*  CREATININE 1.88* 1.91*  CALCIUM 9.1 9.0   Liver Function Tests:  Recent Labs Lab 05/06/14 0840 05/10/14 0330  AST 140* 29  ALT 127* 49*  ALKPHOS 55 52  BILITOT 1.1 0.8  PROT 6.5 5.7*  ALBUMIN 3.4* 2.9*    Recent Labs Lab 05/06/14 0840  LIPASE 33   No results for input(s): AMMONIA in the last 168 hours. CBC:  Recent Labs Lab 05/06/14 0840  05/09/14 0330 05/10/14 0330  WBC 15.8*  < > 7.5 5.8  NEUTROABS 13.4*  --   --   --   HGB 13.1  < > 12.2 12.1  HCT 41.1  < > 37.7 36.6  MCV 93.8  < > 92.0 93.1  PLT 430*  < > 261 253  < > = values in this interval not displayed. Cardiac Enzymes:  Recent Labs Lab 05/06/14 1346 05/06/14 1900 05/07/14 0121    TROPONINI 0.62* 0.42* 0.34*   BNP:  Recent Labs Lab 05/06/14 0840  PROBNP 41228.0*   D-Dimer: No results for input(s): DDIMER in the last 168 hours. CBG: No results for input(s): GLUCAP in the last 168 hours. Hemoglobin A1C: No results for input(s): HGBA1C in the last 168 hours. Fasting Lipid Panel: No results for input(s): CHOL, HDL, LDLCALC, TRIG, CHOLHDL, LDLDIRECT in the last 168 hours. Thyroid Function Tests: No results for input(s): TSH, T4TOTAL, FREET4, T3FREE, THYROIDAB in the last 168 hours. Coagulation:  Recent Labs Lab 05/06/14 0840  LABPROT 15.2  INR 1.18   Anemia Panel: No results for input(s): VITAMINB12, FOLATE, FERRITIN, TIBC, IRON, RETICCTPCT in the last 168 hours. Urine Drug Screen: Drugs of Abuse  No results found for: LABOPIA, COCAINSCRNUR, LABBENZ, AMPHETMU, THCU, LABBARB  Alcohol Level: No results for input(s): ETH in the last 168 hours. Urinalysis:  Recent Labs Lab 05/06/14 1010  COLORURINE YELLOW  LABSPEC 1.013  PHURINE 5.5  GLUCOSEU NEGATIVE  HGBUR NEGATIVE  BILIRUBINUR NEGATIVE  KETONESUR NEGATIVE  PROTEINUR 30*  UROBILINOGEN 0.2  NITRITE NEGATIVE  LEUKOCYTESUR TRACE*   Micro Results: Recent Results (from the past  240 hour(s))  MRSA PCR Screening     Status: None   Collection Time: 05/06/14  5:22 PM  Result Value Ref Range Status   MRSA by PCR NEGATIVE NEGATIVE Final    Comment:        The GeneXpert MRSA Assay (FDA approved for NASAL specimens only), is one component of a comprehensive MRSA colonization surveillance program. It is not intended to diagnose MRSA infection nor to guide or monitor treatment for MRSA infections.    Studies/Results: No results found. Medications: I have reviewed the patient's current medications. Scheduled Meds: . apixaban  2.5 mg Oral BID  . hydrALAZINE  25 mg Oral 3 times per day  . isosorbide mononitrate  30 mg Oral Daily  . simvastatin  20 mg Oral Daily  . sodium chloride  10-40 mL  Intracatheter Q12H   Continuous Infusions: . sodium chloride 8 mL/hr at 05/08/14 1100   PRN Meds:.sodium chloride Assessment/Plan: Principal Problem:   Acute respiratory failure Active Problems:   Acute on chronic systolic congestive heart failure   Severe mitral regurgitation   Chronic renal failure, stage 4 (severe)   Coronary artery disease due to lipid rich plaque   Congestive heart disease  Patient is an 80 year old female with a history of chronic kidney disease, congestive heart failure, hypertension, diabetes, overactive bladder, and hyperlipidemia who is admitted for acute decompensated heart failure.  Acute decompensated congestive heart failure as part of cardiorenal syndrome: Echocardiogram from this admission showing a left ventricular ejection fraction of 10-15% with diffuse akinesis and cavity size dilation along with mild to moderate regurgitation. Pulmonary artery peak pressure of 53 mmHg. Central venous oxygenation has improved from 57% initially to 65% today. Patient was able to have another 770 mL of net output over the last 24 hours, with -4.7 L during this hospitalization. CVP of 5 this morning. Patient has weaned down to 2 L nasal cannula. -Appreciate cardiology recommendations -Trying to wean off milrinone today -Metolazone discontinued -Lasix held, with potential plan of starting by mouth Lasix 60 mg twice a day tomorrow -Continue with hydralazine 12.5 mg every 8 hours and Imdur 15 mg daily -Not a candidate for beta blocker (acute heart failure), ACE inhibitor, or ARB (chronic kidney disease)  New onset atrial fibrillation: Patient with a six-hour episode of atrial fibrillation on the morning of 05/08/2014. Patient spontaneously reverted to normal sinus rhythm with no recurrent episodes since then. -Continue Eliquis 2.5 mg twice a day  Coronary artery disease: Patient with a STEMI in October and managed medically at [facility] secondary to concerns about renal function.  Patient with some chest pain after discontinuing milrinone. EKG unremarkable.  -cycle troponins -Discontinued aspirin 81 mg and Plavix 75 mg daily given high risk for bleeding on eliquis.   Hypotension: Pressures still soft in the 99I to 338S systolic. -Continue with hydralazine and Imdur.  Stage IV chronic kidney disease: Creatinine trending slightly up to 1.91 from 1.88 yesterday.   - Continue to monitor in the setting of weaning off milrinone.  Diet: Heart Prophylaxis: heparin sq and SCDs Code: Full (confirmed with family that this is the current status, despite DO NOT RESUSCITATE records in the file)  Resolved/stabilized: Leukocytosis Thrombocythemia Abdominal pain Demand ischemia Anion gap acidosis  Normocytic anemia  Dispo: Occupational therapy recommending skilled nursing facility. Disposition is deferred at this time, awaiting improvement of current medical problems. Anticipated discharge in approximately 3 day(s).   The patient does have a current PCP (No primary care provider on file.)  and does need an [facility] hospital follow-up appointment after discharge.  The patient does have transportation limitations that hinder transportation to clinic appointments.  LOS: 4 days   Luan Moore, MD 05/10/2014, 8:37 AM

## 2014-05-10 NOTE — Progress Notes (Signed)
Patient complains of 6/10 CP radiating across chest from left to right. MD made aware. Orders given. EKG obtained, Asprin and morphine given. Within 10 minutes pt stated that CP had resolved. Patient educated to notify nurse if CP comes back. Will continue to monitor closely. Roxan Hockey, RN

## 2014-05-11 DIAGNOSIS — I2583 Coronary atherosclerosis due to lipid rich plaque: Secondary | ICD-10-CM

## 2014-05-11 DIAGNOSIS — N189 Chronic kidney disease, unspecified: Secondary | ICD-10-CM

## 2014-05-11 DIAGNOSIS — N289 Disorder of kidney and ureter, unspecified: Secondary | ICD-10-CM

## 2014-05-11 DIAGNOSIS — I9589 Other hypotension: Secondary | ICD-10-CM

## 2014-05-11 LAB — CARBOXYHEMOGLOBIN
CARBOXYHEMOGLOBIN: 1.2 % (ref 0.5–1.5)
Methemoglobin: 0.9 % (ref 0.0–1.5)
O2 SAT: 65.1 %
TOTAL HEMOGLOBIN: 12.7 g/dL (ref 12.0–16.0)

## 2014-05-11 LAB — BASIC METABOLIC PANEL
Anion gap: 13 (ref 5–15)
BUN: 29 mg/dL — ABNORMAL HIGH (ref 6–23)
CO2: 30 mEq/L (ref 19–32)
Calcium: 8.8 mg/dL (ref 8.4–10.5)
Chloride: 93 mEq/L — ABNORMAL LOW (ref 96–112)
Creatinine, Ser: 1.78 mg/dL — ABNORMAL HIGH (ref 0.50–1.10)
GFR calc Af Amer: 28 mL/min — ABNORMAL LOW (ref >90)
GFR, EST NON AFRICAN AMERICAN: 24 mL/min — AB (ref >90)
GLUCOSE: 96 mg/dL (ref 70–99)
Potassium: 4 mEq/L (ref 3.7–5.3)
SODIUM: 136 meq/L — AB (ref 137–147)

## 2014-05-11 LAB — CBC
HCT: 37.2 % (ref 36.0–46.0)
HEMOGLOBIN: 12.2 g/dL (ref 12.0–15.0)
MCH: 30.2 pg (ref 26.0–34.0)
MCHC: 32.8 g/dL (ref 30.0–36.0)
MCV: 92.1 fL (ref 78.0–100.0)
PLATELETS: 266 10*3/uL (ref 150–400)
RBC: 4.04 MIL/uL (ref 3.87–5.11)
RDW: 14 % (ref 11.5–15.5)
WBC: 6.5 10*3/uL (ref 4.0–10.5)

## 2014-05-11 MED ORDER — HYDRALAZINE HCL 25 MG PO TABS: 12.5000 mg | ORAL_TABLET | Freq: Three times a day (TID) | ORAL | Status: DC

## 2014-05-11 MED ORDER — HYDRALAZINE HCL 25 MG PO TABS
12.5000 mg | ORAL_TABLET | Freq: Three times a day (TID) | ORAL | Status: DC
  Administered 2014-05-11: 12.5 mg via ORAL
  Filled 2014-05-11 (×3): qty 0.5

## 2014-05-11 MED ORDER — ISOSORBIDE MONONITRATE 15 MG HALF TABLET: 15.0000 mg | ORAL_TABLET | Freq: Every day | ORAL | Status: DC

## 2014-05-11 MED ORDER — APIXABAN 2.5 MG PO TABS: 2.5000 mg | ORAL_TABLET | Freq: Two times a day (BID) | ORAL | Status: DC

## 2014-05-11 MED ORDER — TORSEMIDE 20 MG PO TABS: 40.0000 mg | ORAL_TABLET | Freq: Every day | ORAL | Status: DC

## 2014-05-11 MED ORDER — METOLAZONE 2.5 MG PO TABS: 2.5000 mg | ORAL_TABLET | Freq: Every day | ORAL | Status: DC

## 2014-05-11 NOTE — Progress Notes (Signed)
Occupational Therapy Treatment Patient Details Name: Leah Kramer MRN: [id number] DOB: Feb 28, 1924 Today's Date: 05/11/2014    History of present illness Pt adm with respiratory failure. Pt with acute on chronic renal failure and stage 4 kidney failure. Pt also with new onset a-fib with RVR. PMH - recent STEMI, DM, HTN   OT comments  This 80 yo female is making great progress. She ambulated around 2/3 of unit (300 feet, washed her face, combed her hair, and doffed/donned her socks) during this session. Daughter is asking about going home v. SNF (due to she is saying this could be her Mom's last Christmas). The best option would probably be SNF x1 week and then home from SNF. However I can see the daughter's point and pt could go home with min guard A anytime she is up on her feet (her husband said he understood this, as well as the daughter) and would need a 3n1. OT will continue to follow.  Follow Up Recommendations  SNF ((HHOT unless there is something medically that needs to monitored more closely and thus will need SNF))    Equipment Recommendations  3 in 1 bedside comode       Precautions / Restrictions Precautions Precautions: Fall Restrictions Weight Bearing Restrictions: No       Mobility Bed Mobility               General bed mobility comments: Pt up on 3n1 upon my arrival  Transfers Overall transfer level: Needs assistance Equipment used: Rolling walker (2 wheeled) Transfers: Sit to/from Stand Sit to Stand: Min guard                  ADL Overall ADL's : Needs assistance/impaired     Grooming: Wash/dry face;Wash/dry hands;Brushing hair;Min guard;Standing               Lower Body Dressing: Min guard;Sit to/from stand   Toilet Transfer: Min guard;Ambulation;RW (3n1>around 2/3 unit>to recliner)   Toileting- Clothing Manipulation and Hygiene: Min guard;Sit to/from stand         General ADL Comments: Pt tolerated 27 minutes of  activity--reporting that she was worn out at the end                Cognition   Behavior During Therapy: [facility] for tasks assessed/performed Overall Cognitive Status: Within Functional Limits for tasks assessed                                    Pertinent Vitals/ Pain       Pain Assessment: No/denies pain  Home Living Family/patient expects to be discharged to:: Skilled nursing facility                                            Frequency Min 2X/week     Progress Toward Goals  OT Goals(current goals can now be found in the care plan section)  Progress towards OT goals: Progressing toward goals     Plan Discharge plan remains appropriate       End of Session Equipment Utilized During Treatment: Gait belt;Rolling walker (RA)   Activity Tolerance Patient tolerated treatment well   Patient Left in chair;with call bell/phone within reach;with family/visitor present   Nurse Communication  (I feel pt can go home v. SNF  if there is not anything medically that they want to monitor more closely at the SNF)        Time: 6599-3570 OT Time Calculation (min): 44 min  Charges: OT General Charges $OT Visit: 1 Procedure OT Treatments $Self Care/Home Management : 38-52 mins  Almon Register [phone] 05/11/2014, 9:54 AM

## 2014-05-11 NOTE — Discharge Summary (Signed)
Name: Leah Kramer MRN: [id number] DOB: 07-08-1923 80 y.o. PCP: No primary care provider on file.  Date of Admission: 05/06/2014  8:04 AM Date of Discharge: 05/11/2014 Attending Physician: Bartholomew Crews, MD  Discharge Diagnosis: Principal Problem:   Acute respiratory failure Active Problems:   Acute on chronic systolic congestive heart failure   Severe mitral regurgitation   Chronic renal failure, stage 4 (severe)   Coronary artery disease due to lipid rich plaque   Congestive heart disease   Arterial hypotension   Renal insufficiency  Discharge Medications:   Medication List    ASK your doctor about these medications        aspirin EC 81 MG tablet  Take 81 mg by mouth daily.     calcium carbonate 500 MG chewable tablet  Commonly known as:  TUMS - dosed in mg elemental calcium  Chew 1 tablet by mouth 3 (three) times daily as needed for indigestion or heartburn.     calcium-vitamin D 250-125 MG-UNIT per tablet  Commonly known as:  OSCAL  Take 1 tablet by mouth daily.     cholecalciferol 400 UNITS Tabs tablet  Commonly known as:  VITAMIN D  Take 400 Units by mouth daily.     clopidogrel 75 MG tablet  Commonly known as:  PLAVIX  Take 75 mg by mouth every morning.     cyanocobalamin 1000 MCG/ML injection  Commonly known as:  (VITAMIN B-12)  Inject 1,000 mcg into the muscle every 30 (thirty) days.     furosemide 40 MG tablet  Commonly known as:  LASIX  Take 40-60 mg by mouth 2 (two) times daily. 60mg  every morning and 40mg  every evening     hydrALAZINE 10 MG tablet  Commonly known as:  APRESOLINE  Take 5 mg by mouth 3 (three) times daily.     isosorbide dinitrate 5 MG tablet  Commonly known as:  ISORDIL  Take 5 mg by mouth 3 (three) times daily.     metoprolol succinate 25 MG 24 hr tablet  Commonly known as:  TOPROL-XL  Take 25 mg by mouth daily.     nitroGLYCERIN 0.4 MG SL tablet  Commonly known as:  NITROSTAT  Place 0.4 mg under the tongue every  5 (five) minutes as needed for chest pain.     omeprazole 20 MG capsule  Commonly known as:  PRILOSEC  Take 20 mg by mouth 2 (two) times daily.     oxybutynin 10 MG 24 hr tablet  Commonly known as:  DITROPAN-XL  Take 10 mg by mouth every morning.     polyethylene glycol packet  Commonly known as:  MIRALAX / GLYCOLAX  Take 17 g by mouth daily.     simvastatin 20 MG tablet  Commonly known as:  ZOCOR  Take 20 mg by mouth daily.     vitamin E 400 UNIT capsule  Take 400 Units by mouth daily.        Disposition and follow-up:   Ms.Leah Kramer was discharged from [facility] in Stable condition.  At the hospital follow up visit please address:  1.  Fluid status, weight, need for supplemental oxygen. Goals of care.  2.  Labs / imaging needed at time of follow-up: none  3.  Pending labs/ test needing follow-up: none  Follow-up Appointments:  Heart failure team to schedule follow-up appointment.  Consultations: Treatment Team:  Rounding Lbcardiology, MD  Procedures Performed:  Dg Chest Port 1 View  05/07/2014  CLINICAL DATA:  PICC line placement  EXAM: PORTABLE CHEST - 1 VIEW  COMPARISON:  05/06/2014  FINDINGS: Right-sided PICC line with the tip projecting over the right atrium. Recommend retracting the PICC line 4 cm.  Bilateral small pleural effusions, left greater than right. Mild bilateral interstitial thickening. No pneumothorax. Stable cardiomediastinal silhouette. No acute osseous abnormality.  IMPRESSION: 1. Right-sided PICC line with the tip projecting over the right atrium. Recommend retracting the PICC line 4 cm. 2. Stable CHF.   Electronically Signed   By: Kathreen Devoid   On: 05/07/2014 16:57   Dg Chest Port 1 View  05/06/2014   CLINICAL DATA:  Respiratory distress. Abdominal pain. Initial encounter.  EXAM: PORTABLE CHEST - 1 VIEW  COMPARISON:  04/07/2014 and 03/21/2014 radiographs.  FINDINGS: 0821 hr. There are new moderate-sized pleural effusions  with associated bibasilar atelectasis and mild pulmonary edema. Cardiomegaly appears grossly stable. There is stable atherosclerosis of the thoracic aorta. No pneumothorax or acute osseous findings demonstrated. There are probable loose bodies in the right shoulder suprascapular recess.  IMPRESSION: Cardiomegaly, pulmonary edema and moderate bilateral pleural effusions consistent with congestive heart failure.   Electronically Signed   By: Camie Patience M.D.   On: 05/06/2014 08:37    2D Echo:   - Left ventricle: LVEF is approximately 10 to 15% with akinesi of the mid/distal anterior, mid/distal lateral, mid/distal septal, mid/distal posterior, distal inferior and apical walls; hypokinesis elsewhere The cavity size was severely dilated. Wall thickness was normal. - Aortic valve: There was moderate regurgitation. - Mitral valve: There was mild to moderate regurgitation. - Left atrium: The atrium was moderately dilated. - Right ventricle: Systolic function was mildly reduced. - Right atrium: The atrium was mildly dilated. - Pulmonary arteries: PA peak pressure: 53 mm Hg (S). - Pericardium, extracardiac: A trivial pericardial effusion was identified.  Admission HPI:   Patient is an 80 year old female with a history of chronic kidney disease, congestive heart failure, hypertension, diabetes, overactive bladder, and hyperlipidemia who presents with several week history of dyspnea. Patient was somnolent exam so most of history provided by the family. Family states that patient had a myocardial infarction in October of this year and was evaluated at [facility]. There do not seem to be any notes available through care everywhere in the chart. Since then, patient has been in rehabilitation and has been improving, being able to ambulate with a walker at baseline. However, patient has had persistent dyspnea at night with increased requirement for elevation during sleep to maintain her breathing. Last  night, patient had much worse dyspnea and increased work of breathing. No significant productive cough. Patient had also reportedly reported that her stomach hurt and was holding The right side of her abdomen.  Upon evaluation in the emergency department, family reports that patient required a nonrebreather mask initially and later settled down on nasal cannula 6 L. Patient was found to have pulmonary edema on chest x-ray and given 80 mg of Lasix IV. Since then, family reports that patient has not had any significant urine output. She has a family history of mother with heart failure and dying in her 71s. Otherwise, patient denies any tobacco, alcohol, or illicit drug use.  Hospital Course by problem list: Principal Problem:   Acute respiratory failure Active Problems:   Acute on chronic systolic congestive heart failure   Severe mitral regurgitation   Chronic renal failure, stage 4 (severe)   Coronary artery disease due to lipid rich plaque   Congestive heart  disease   Arterial hypotension   Renal insufficiency   Acute decompensated congestive heart failure as part of cardiorenal syndrome: Patient with initial presentation of increased dyspnea and orthopnea at her facility. Patient status post STEMI in October 2015 managed at [facility]. Repeat echocardiogram from this admission showing left ventricular ejection fraction of 10-15% with diffuse akinesis and a cavity size dilatation along with mild to moderate regurgitation. Patient was initially admitted to stepdown and then transferred to heart step down for increased monitoring and central venous oxygen monitoring. Patient initially received 80 mg of IV Lasix with minimal urine output though with some resolution of symptoms. Patient was on 6 L of oxygen upon initial evaluation and had weaned down to 4 L of oxygen after this first dose. However, central venous oxygenation was found to be in the mid 50s. Patient also had an current acute kidney injury,  consistent with cardiorenal syndrome. At this point, patient was started on a milrinone drip and aggressively diuresis with 120 mg of IV Lasix twice a day. Patient was also restarted on her afterload reduction with nitrates and hydralazine. With this intervention, patient had increased net output with -4.1 L by the time of discharge. Patient's renal function had also improved, with a creatinine trending down to 1.78 upon discharge. Patient also weaned off of supplemental oxygen with no need for supplemental oxygen by the time of discharge. Patient is nearly down 12 pounds compared to admission with a discharge weight of around 117 pounds. Patient is to be discharged on hydralazine 12.5 mg 3 times a day, Imdur 15 mg daily, metolazone 2.5 mg when weight is 118 or greater, apixiban 2.5 mg twice a day, and simvastatin 20 mg daily. ACE inhibitor, beta blocker, and ARB were all held because of contraindications.  New onset atrial fibrillation: She had a six-hour episode of atrial fibrillation on the morning of 05/08/2014, potentially in response to her first dose of milrinone. Patient spontaneously reverted to normal sinus rhythm and remained so for the rest of her hospitalization. Given her high risk profile for paroxysmal atrial fibrillation and profound heart failure, patient was initiated and will continue on apixiban 2.5 mg 3 times a day.  Coronary artery disease: Patient with a recent STEMI in October and medically managed at [facility] secondary to concerns about renal function. Upon initial presentation, patient had elevated troponins which ended up trending down with no significant EKG changes. This was favored to be demand ischemia secondary to her acute illness otherwise. Patient had some episodes of chest pain around the time of discontinuing her milrinone drip. EKG was unremarkable and troponins have been negative. Patient's aspirin 81 mg and Plavix 75 mg were discontinued because of high risk of bleeding in the  context of initiating Eliquis.  Hypotension: Blood pressures remained soft in the 75I to 433 systolic throughout her hospitalization. However, afterload reduction was initiated with hydralazine and Imdur. Patient to be discharged with the regimen as stated above.  Stage IV chronic kidney disease: Patient presenting with initial elevation in creatinine around 2.0. Patient's creatinine showed some improvement after the initiation of milrinone, supporting hypoperfusion secondary to cardiac failure. Creatinine had trended down to 1.78 by the time of discharge.  Leukocytosis/thrombocytopenia: Patient initially presented with a leukocytosis and thrombocytopenia which resolved during her hospitalization. Likely to be reactive due to her acute illness.  Discharge Vitals:   BP 103/87 mmHg  Pulse 91  Temp(Src) 98.2 F (36.8 C) (Oral)  Resp 18  Ht 5\' 2"  (1.575 m)  Wt 117 lb 15.1 oz (53.5 kg)  BMI 21.57 kg/m2  SpO2 92%  Discharge Labs:  Results for orders placed or performed during the hospital encounter of 05/06/14 (from the past 24 hour(s))  Troponin I (q 6hr x 3)     Status: None   Collection Time: 05/10/14  3:50 PM  Result Value Ref Range   Troponin I <0.30 <0.30 ng/mL  CBC     Status: None   Collection Time: 05/11/14  3:30 AM  Result Value Ref Range   WBC 6.5 4.0 - 10.5 K/uL   RBC 4.04 3.87 - 5.11 MIL/uL   Hemoglobin 12.2 12.0 - 15.0 g/dL   HCT 37.2 36.0 - 46.0 %   MCV 92.1 78.0 - 100.0 fL   MCH 30.2 26.0 - 34.0 pg   MCHC 32.8 30.0 - 36.0 g/dL   RDW 14.0 11.5 - 15.5 %   Platelets 266 150 - 400 K/uL  Basic metabolic panel     Status: Abnormal   Collection Time: 05/11/14  3:30 AM  Result Value Ref Range   Sodium 136 (L) 137 - 147 mEq/L   Potassium 4.0 3.7 - 5.3 mEq/L   Chloride 93 (L) 96 - 112 mEq/L   CO2 30 19 - 32 mEq/L   Glucose, Bld 96 70 - 99 mg/dL   BUN 29 (H) 6 - 23 mg/dL   Creatinine, Ser 1.78 (H) 0.50 - 1.10 mg/dL   Calcium 8.8 8.4 - 10.5 mg/dL   GFR calc non Af Amer  24 (L) >90 mL/min   GFR calc Af Amer 28 (L) >90 mL/min   Anion gap 13 5 - 15  Carboxyhemoglobin     Status: None   Collection Time: 05/11/14  5:00 AM  Result Value Ref Range   Total hemoglobin 12.7 12.0 - 16.0 g/dL   O2 Saturation 65.1 %   Carboxyhemoglobin 1.2 0.5 - 1.5 %   Methemoglobin 0.9 0.0 - 1.5 %    Signed: Luan Moore, MD 05/11/2014, 12:39 PM

## 2014-05-11 NOTE — Progress Notes (Signed)
Subjective:   80 year old female with history of heart failure, chronic kidney disease, hypertension, diabetes, CAD.  Per Care Everywhere she presented to [facility] in October with late STEMI. Treated medically. EF 20% and severe MR. Has been struggling with volume management in setting on chronic renal failure.    TTE 03/2014 [facility]): Severe LV dysfunction (EF 20%) with basal inferior, basal anterospetal, and basal anterior hypokinesis, dilated LV, diastolic dysfunction, elevated LV filling pressures, degen MV with severe MR, dilated LA, moderate AR, severe pulmonary hypertension, normal RV function, mild TR, elevated CVP  Admitted 12/4 with respiratory failure due to recurrent HF. Troponin minimally elevated. Transferred to stepdown yesterday (12/6) and PICC placed. Co-ox initially 57% and milrinone 0.25 mcg started.   Yesterday milrinone was stopped. CO-OX 65% today. Renal function stable. No weight on chart yet. I/O slightly +/    Denies SOB/Orthopnea.  CVP 4-5.   Creatinine 1.78  Co-ox- 65%  Intake/Output Summary (Last 24 hours) at 05/11/14 0914 Last data filed at 05/11/14 0700  Gross per 24 hour  Intake  708.7 ml  Output    200 ml  Net  508.7 ml    Current meds: . apixaban  2.5 mg Oral BID  . hydrALAZINE  25 mg Oral 3 times per day  . isosorbide mononitrate  30 mg Oral Daily  . simvastatin  20 mg Oral Daily  . sodium chloride  10-40 mL Intracatheter Q12H  . torsemide  40 mg Oral Daily   Infusions: . sodium chloride 10 mL/hr at 05/10/14 0839     Objective:  Blood pressure 99/56, pulse 89, temperature 98.3 F (36.8 C), temperature source Oral, resp. rate 18, height 5\' 2"  (1.575 m), weight 115 lb 4.8 oz (52.3 kg), SpO2 100 %. Weight change:   Physical Exam:  General: Elderly, lying flat in bed Neck: JVP flat Head: Eyes PERRLA, No xanthomas. Normal cephalic and atramatic Lungs:Decreased in the bases on 2 liters [place].   Heart:Regular rate and rhythm, S1 S2  Pulses are 2+ & equal. 2/6 MR +s3 No carotid bruit.  No abdominal bruits.  Abdomen: Bowel sounds are positive, abdomen soft and non-tender without masses. No hepatosplenomegaly. Mildly distended Msk: Back normal. Appears weakened Extremities: No clubbing, cyanosis, no edema. DP +1 Neuro: Alert and oriented X 3, non-focal, MAE x 4 Psych: Good affect, responds appropriately  Telemetry: SR 90s with PVCs.   Lab Results: Basic Metabolic Panel:  Recent Labs Lab 05/07/14 2030 05/08/14 0545 05/09/14 0330 05/10/14 0330 05/11/14 0330  NA 140 143 142 136* 136*  K 3.3* 3.7 4.3 4.1 4.0  CL 97 97 96 93* 93*  CO2 30 32 32 31 30  GLUCOSE 156* 125* 104* 108* 96  BUN 40* 38* 35* 33* 29*  CREATININE 2.03* 1.89* 1.88* 1.91* 1.78*  CALCIUM 8.6 8.7 9.1 9.0 8.8   Liver Function Tests:  Recent Labs Lab 05/06/14 0840 05/10/14 0330  AST 140* 29  ALT 127* 49*  ALKPHOS 55 52  BILITOT 1.1 0.8  PROT 6.5 5.7*  ALBUMIN 3.4* 2.9*    Recent Labs Lab 05/06/14 0840  LIPASE 33   No results for input(s): AMMONIA in the last 168 hours. CBC:  Recent Labs Lab 05/06/14 0840 05/07/14 0121 05/08/14 0545 05/09/14 0330 05/10/14 0330 05/11/14 0330  WBC 15.8* 7.9 7.0 7.5 5.8 6.5  NEUTROABS 13.4*  --   --   --   --   --   HGB 13.1 11.1* 11.7* 12.2 12.1 12.2  HCT 41.1  33.4* 35.6* 37.7 36.6 37.2  MCV 93.8 92.0 94.2 92.0 93.1 92.1  PLT 430* 253 251 261 253 266   Cardiac Enzymes:  Recent Labs Lab 05/06/14 1346 05/06/14 1900 05/07/14 0121 05/10/14 1013 05/10/14 1550  TROPONINI 0.62* 0.42* 0.34* <0.30 <0.30   BNP: Invalid input(s): POCBNP CBG: No results for input(s): GLUCAP in the last 168 hours. Microbiology: No results found for: CULT No results for input(s): CULT, SDES in the last 168 hours.  Imaging: No results found.   ASSESSMENT:  1. Acute respiratory failure 2. Acute on chronic systolic HF (EF 88%) 3. CKD, stage 4 4. Severe MR 5. CAD s/p recent STEMI at [facility] 10/15 -  late presenting. Treated medically 6. Elevated troponin  7. Atrial fibrillation  PLAN/DISCUSSION:  She was admitted for significant volume overload with probable low output HF in the setting of EF 20% and severe MR. Management complicated by severe CKD with GFR <20.   12/6 transferred to stepdown and had PICC placed. Initial co-ox 57% and she was started on milrinone 0.25 mcg. Yesterday milrinone stopped and this am CO-OX is 65%. Volume status stable. Continue torsemide 40 mg daily. Renal function stable. Continue current dose hyral/nitrates for afterload reduction. SBP soft hold off on bb.   Maintaining SR. On apixaban 2.5 mg twice a day.   Per OT--> needs SNF versus HH  LOS: 5 days  CLEGG,[name], NP-C 05/11/2014, 9:14 AM  Patient seen and examined with Darrick Grinder, NP. We discussed all aspects of the encounter. I agree with the assessment and plan as stated above.   Looks very good today. Missed afternoon dose of hydralazine yesterday due to SBP < 90. Would cut hydralazine back to 12.5 tid and imdur to 15 daily. Continue torsemide 40 daily.   Likely can go to SNF today on  Hydral 12.5 tid Imdur 15 daily Metolazone 2.5 mg prn for weight 118 or greater Apixaban 2.5 bid Simva 20  No ace/ARB or b-blocker.   Can go to SNF today from our standpoint. F/u in HF Clinic early next week with BMET. Will place appointment on chart.   Daniel Bensimhon,MD 10:19 AM

## 2014-05-11 NOTE — Progress Notes (Signed)
CSW (Clinical Education officer, museum) prepared pt [employer] and provided to pt daughter. Pt family to transport pt. Facility and pt nurse notified. CSW signing off.  [place], [place]

## 2014-05-11 NOTE — Progress Notes (Signed)
Patient plan to D/C with family to transport via POV to SNF.

## 2014-05-11 NOTE — Progress Notes (Signed)
1405-1500 Cardiac Rehab Completed CHF education with pt and daughter. We discussed CHF, zones, sodium and fluid restrictions, when to call MD and 911. They voice understanding. I placed CHF video on for them to watch. Deon Pilling, RN 05/11/2014 3:00 PM

## 2014-05-11 NOTE — Progress Notes (Signed)
Pt discharge instruction complete, discharge to front of hospital.

## 2014-05-11 NOTE — Progress Notes (Signed)
Subjective:  Patient reporting some chest pain after the discontinuation of milrinone yesterday. EKG without any concerning changes and troponins have been negative. Patient's symptoms have since resolved. This morning, patient is feeling well and ready to go home. Patient is denying any other complaints.  Objective: Vital signs in last 24 hours: Filed Vitals:   05/11/14 0945 05/11/14 1000 05/11/14 1135 05/11/14 1200  BP:   88/58 103/87  Pulse:   91   Temp:   98.2 F (36.8 C)   TempSrc:   Oral   Resp:      Height:      Weight:  117 lb 15.1 oz (53.5 kg)    SpO2: 92%  92%    Weight change:   Intake/Output Summary (Last 24 hours) at 05/11/14 1231 Last data filed at 05/11/14 1200  Gross per 24 hour  Intake  998.7 ml  Output    650 ml  Net  348.7 ml   General: Resting in chair, in no acute distress, no nasal cannula in place HEENT: PERRL, EOMI, no scleral icterus, alert and oriented 4 Cardiac: RRR, 2/6 systolic murmur, S3, no rubs, murmurs or gallops Pulm: Clear to auscultation bilaterally, no wheezes rhonchi or rales Abd: soft, nontender, nondistended, BS present Ext: warm and well perfused, no pedal edema Neuro: alert and oriented X3, cranial nerves II-XII grossly intact Skin: no rashes or lesions noted Psych: appropriate affect  Lab Results: Basic Metabolic Panel:  Recent Labs Lab 05/10/14 0330 05/11/14 0330  NA 136* 136*  K 4.1 4.0  CL 93* 93*  CO2 31 30  GLUCOSE 108* 96  BUN 33* 29*  CREATININE 1.91* 1.78*  CALCIUM 9.0 8.8   Liver Function Tests:  Recent Labs Lab 05/06/14 0840 05/10/14 0330  AST 140* 29  ALT 127* 49*  ALKPHOS 55 52  BILITOT 1.1 0.8  PROT 6.5 5.7*  ALBUMIN 3.4* 2.9*    Recent Labs Lab 05/06/14 0840  LIPASE 33   No results for input(s): AMMONIA in the last 168 hours. CBC:  Recent Labs Lab 05/06/14 0840  05/10/14 0330 05/11/14 0330  WBC 15.8*  < > 5.8 6.5  NEUTROABS 13.4*  --   --   --   HGB 13.1  < > 12.1 12.2  HCT  41.1  < > 36.6 37.2  MCV 93.8  < > 93.1 92.1  PLT 430*  < > 253 266  < > = values in this interval not displayed. Cardiac Enzymes:  Recent Labs Lab 05/07/14 0121 05/10/14 1013 05/10/14 1550  TROPONINI 0.34* <0.30 <0.30   BNP:  Recent Labs Lab 05/06/14 0840  PROBNP 41228.0*   D-Dimer: No results for input(s): DDIMER in the last 168 hours. CBG: No results for input(s): GLUCAP in the last 168 hours. Hemoglobin A1C: No results for input(s): HGBA1C in the last 168 hours. Fasting Lipid Panel: No results for input(s): CHOL, HDL, LDLCALC, TRIG, CHOLHDL, LDLDIRECT in the last 168 hours. Thyroid Function Tests: No results for input(s): TSH, T4TOTAL, FREET4, T3FREE, THYROIDAB in the last 168 hours. Coagulation:  Recent Labs Lab 05/06/14 0840  LABPROT 15.2  INR 1.18   Anemia Panel: No results for input(s): VITAMINB12, FOLATE, FERRITIN, TIBC, IRON, RETICCTPCT in the last 168 hours. Urine Drug Screen: Drugs of Abuse  No results found for: LABOPIA, COCAINSCRNUR, LABBENZ, AMPHETMU, THCU, LABBARB  Alcohol Level: No results for input(s): ETH in the last 168 hours. Urinalysis:  Recent Labs Lab 05/06/14 1010  COLORURINE YELLOW  LABSPEC 1.013  PHURINE 5.5  GLUCOSEU NEGATIVE  HGBUR NEGATIVE  BILIRUBINUR NEGATIVE  KETONESUR NEGATIVE  PROTEINUR 30*  UROBILINOGEN 0.2  NITRITE NEGATIVE  LEUKOCYTESUR TRACE*   Micro Results: Recent Results (from the past 240 hour(s))  MRSA PCR Screening     Status: None   Collection Time: 05/06/14  5:22 PM  Result Value Ref Range Status   MRSA by PCR NEGATIVE NEGATIVE Final    Comment:        The GeneXpert MRSA Assay (FDA approved for NASAL specimens only), is one component of a comprehensive MRSA colonization surveillance program. It is not intended to diagnose MRSA infection nor to guide or monitor treatment for MRSA infections.    Studies/Results: No results found. Medications: I have reviewed the patient's current  medications. Scheduled Meds: . apixaban  2.5 mg Oral BID  . hydrALAZINE  12.5 mg Oral 3 times per day  . [START ON 05/12/2014] isosorbide mononitrate  15 mg Oral Daily  . simvastatin  20 mg Oral Daily  . sodium chloride  10-40 mL Intracatheter Q12H  . torsemide  40 mg Oral Daily   Continuous Infusions: . sodium chloride 10 mL/hr at 05/11/14 1200   PRN Meds:.morphine injection, sodium chloride Assessment/Plan: Principal Problem:   Acute respiratory failure Active Problems:   Acute on chronic systolic congestive heart failure   Severe mitral regurgitation   Chronic renal failure, stage 4 (severe)   Coronary artery disease due to lipid rich plaque   Congestive heart disease   Arterial hypotension   Renal insufficiency  Patient is an 80 year old female with a history of chronic kidney disease, congestive heart failure, hypertension, diabetes, overactive bladder, and hyperlipidemia who is admitted for acute decompensated heart failure.  Acute decompensated congestive heart failure as part of cardiorenal syndrome: Echocardiogram from this admission showing a left ventricular ejection fraction of 10-15% with diffuse akinesis and cavity size dilation along with mild to moderate regurgitation. Pulmonary artery peak pressure of 53 mmHg. Central venous oxygenation has been stable in the mid 60s over the last 2 days. Patient is net -4.1 L during this hospitalization. Patient has been weaned off of supplemental oxygen. Patient has also been weaned off the milrinone yesterday. -Appreciate cardiology recommendations -Cardiology recommending discharge with hydralazine 12.5 3 times a day, Imdur 15 mg daily, metolazone 2.5 mg when necessary for weight 118 or greater, apixaban 2.5 mg twice a day, and simvastatin 20 mg -Holding off on ACE inhibitor, beta blocker, and ARB because of contraindications.  New onset atrial fibrillation: Patient with a six-hour episode of atrial fibrillation on the morning of  05/08/2014. Patient spontaneously reverted to normal sinus rhythm with no recurrent episodes since then. -Continue apixiban 2.5 mg twice a day  Coronary artery disease: Patient with a STEMI in October and managed medically at [facility] secondary to concerns about renal function. Patient with some chest pain after discontinuing milrinone. EKG unremarkable. Troponins have been negative -Discontinued aspirin 81 mg and Plavix 75 mg daily given high risk for bleeding on eliquis.   Hypotension: Pressures still soft in the 54O to 270J systolic. -Decreasing hydralazine to 12.5 3 times a day.  Stage IV chronic kidney disease: Creatinine trending down this 1.78 down from 1.91.  Diet: Heart Prophylaxis: heparin sq and SCDs Code: Full (confirmed with family that this is the current status, despite DO NOT RESUSCITATE records in the file)  Resolved/stabilized: Leukocytosis Thrombocythemia Abdominal pain Demand ischemia Anion gap acidosis  Chest pain Normocytic anemia  Dispo: Occupational therapy recommending skilled nursing  facility. Disposition is deferred at this time, awaiting improvement of current medical problems. Anticipated discharge is today.   The patient does have a current PCP (No primary care provider on file.) and does need an [facility] hospital follow-up appointment after discharge.  The patient does have transportation limitations that hinder transportation to clinic appointments.  LOS: 5 days   Luan Moore, MD 05/11/2014, 12:31 PM

## 2014-05-18 ENCOUNTER — Ambulatory Visit (HOSPITAL_COMMUNITY)
Admit: 2014-05-18 | Discharge: 2014-05-18 | Disposition: A | Discharge status: Home / Self Care | Payer: No Typology Code available for payment source | Source: Ambulatory Visit | Attending: Internal Medicine | Admitting: Internal Medicine

## 2014-05-18 ENCOUNTER — Encounter (HOSPITAL_COMMUNITY): Payer: Self-pay

## 2014-05-18 VITALS — BP 111/54 | HR 76 | Resp 18 | Wt 115.5 lb

## 2014-05-18 DIAGNOSIS — I2510 Atherosclerotic heart disease of native coronary artery without angina pectoris: Secondary | ICD-10-CM | POA: Insufficient documentation

## 2014-05-18 DIAGNOSIS — I5022 Chronic systolic (congestive) heart failure: Secondary | ICD-10-CM | POA: Insufficient documentation

## 2014-05-18 DIAGNOSIS — I480 Paroxysmal atrial fibrillation: Secondary | ICD-10-CM | POA: Diagnosis not present

## 2014-05-18 LAB — BASIC METABOLIC PANEL
ANION GAP: 15 (ref 5–15)
BUN: 44 mg/dL — ABNORMAL HIGH (ref 6–23)
CALCIUM: 9.7 mg/dL (ref 8.4–10.5)
CO2: 30 mEq/L (ref 19–32)
Chloride: 94 mEq/L — ABNORMAL LOW (ref 96–112)
Creatinine, Ser: 1.95 mg/dL — ABNORMAL HIGH (ref 0.50–1.10)
GFR calc Af Amer: 25 mL/min — ABNORMAL LOW (ref >90)
GFR calc non Af Amer: 22 mL/min — ABNORMAL LOW (ref >90)
GLUCOSE: 107 mg/dL — AB (ref 70–99)
Potassium: 3.7 mEq/L (ref 3.7–5.3)
Sodium: 139 mEq/L (ref 137–147)

## 2014-05-18 MED ORDER — ISOSORBIDE MONONITRATE ER 30 MG PO TB24: 30.0000 mg | ORAL_TABLET | Freq: Every day | ORAL | Status: DC

## 2014-05-18 MED ORDER — CARVEDILOL 3.125 MG PO TABS: 3.1250 mg | ORAL_TABLET | Freq: Two times a day (BID) | ORAL | Status: DC

## 2014-05-18 NOTE — Patient Instructions (Signed)
Doing great!  INCREASE Imdur to 30mg  (1 tablet) once daily.  START Coreg 3.125 tablets twice daily.  Follow up 4 weeks.  Merry Christmas and Happy New Year!  Do the following things EVERYDAY: 1) Weigh yourself in the morning before breakfast. Write it down and keep it in a log. 2) Take your medicines as prescribed 3) Eat low salt foods-Limit salt (sodium) to 2000 mg per day.  4) Stay as active as you can everyday 5) Limit all fluids for the day to less than 2 liters

## 2014-05-18 NOTE — Progress Notes (Signed)
Patient ID: Leah Kramer, female   DOB: Sep 21, 1923, 80 y.o.   MRN: [id number] PCP: Dr Herma Ard   HPI: 80 year old female with history of heart failure, chronic kidney disease, hypertension, diabetes, CAD.  Per Care Everywhere she presented to [facility] in October with late STEMI. Treated medically. EF 20% and severe MR. Has been struggling with volume management in setting on chronic renal failure.   TTE 03/2014 [facility]): Severe LV dysfunction (EF 20%) with basal inferior, basal anterospetal, and basal anterior hypokinesis, dilated LV, diastolic dysfunction, elevated LV filling pressures, degen MV with severe MR, dilated LA, moderate AR, severe pulmonary hypertension, normal RV function, mild TR, elevated CVP  Admitted 12/4 with respiratory failure due to recurrent HF.  Required short tem milrionone and diuresed with IV lasix.She transitioned to torsemide.  She had short episode of A fib and converted spontaneously. Discharge weight was 117 pounds.   She returns for post hospital follow up. Overall feeling ok. Denies SOB/PND/Orthopnea. Currently at [facility]. Weight at SNF 113 pounds. Medications per SNF. Plans to go home tomorrow. She will have [facility] for ongoing follow up.   ROS: All systems negative except as listed in HPI, PMH and Problem List.  Past Medical History  Diagnosis Date  . CKD (chronic kidney disease)   . CHF (congestive heart failure)   . STEMI (ST elevation myocardial infarction)   . Hypertension   . Diabetes mellitus without complication   . Difficulty walking   . Overactive bladder   . Hyperlipidemia     Current Outpatient Prescriptions  Medication Sig Dispense Refill  . apixaban (ELIQUIS) 2.5 MG TABS tablet Take 1 tablet (2.5 mg total) by mouth 2 (two) times daily. 60 tablet 2  . calcium carbonate (TUMS - DOSED IN MG ELEMENTAL CALCIUM) 500 MG chewable tablet Chew 1 tablet by mouth 3 (three) times daily as needed for indigestion or heartburn.    . calcium-vitamin D  (OSCAL) 250-125 MG-UNIT per tablet Take 1 tablet by mouth daily.    . cholecalciferol (VITAMIN D) 400 UNITS TABS tablet Take 400 Units by mouth daily.    . hydrALAZINE (APRESOLINE) 25 MG tablet Take 0.5 tablets (12.5 mg total) by mouth every 8 (eight) hours. 180 tablet 0  . isosorbide mononitrate (IMDUR) 15 mg TB24 24 hr tablet Take 0.5 tablets (15 mg total) by mouth daily. 60 tablet 0  . metolazone (ZAROXOLYN) 2.5 MG tablet Take 1 tablet (2.5 mg total) by mouth daily. (Patient taking differently: Take 2.5 mg by mouth daily. If weight grater than 118lb) 10 tablet 0  . nitroGLYCERIN (NITROSTAT) 0.4 MG SL tablet Place 0.4 mg under the tongue every 5 (five) minutes as needed for chest pain.    Marland Kitchen omeprazole (PRILOSEC) 20 MG capsule Take 20 mg by mouth 2 (two) times daily.    Marland Kitchen oxybutynin (DITROPAN-XL) 10 MG 24 hr tablet Take 10 mg by mouth every morning.    . polyethylene glycol (MIRALAX / GLYCOLAX) packet Take 17 g by mouth daily.    . simvastatin (ZOCOR) 20 MG tablet Take 20 mg by mouth daily.    Marland Kitchen torsemide (DEMADEX) 20 MG tablet Take 2 tablets (40 mg total) by mouth daily. 60 tablet 0  . vitamin E 400 UNIT capsule Take 400 Units by mouth daily.    . cyanocobalamin (,VITAMIN B-12,) 1000 MCG/ML injection Inject 1,000 mcg into the muscle every 30 (thirty) days.     No current facility-administered medications for this encounter.     PHYSICAL  EXAMDanley Danker Vitals:   05/18/14 1446  BP: 111/54  Pulse: 76  Resp: 18  Weight: 115 lb 8 oz (52.39 kg)  SpO2: 96%    General:  Chronically ill appearing. No resp difficulty. Arrived in wheelchair.  HEENT: normal Neck: supple. JVP 5-6 . Carotids 2+ bilaterally; no bruits. No lymphadenopathy or thryomegaly appreciated. Cor: PMI normal. Regular rate & rhythm. No rubs, gallops or murmurs. Lungs: clear Abdomen: soft, nontender, nondistended. No hepatosplenomegaly. No bruits or masses. Good bowel sounds. Extremities: no cyanosis, clubbing, rash,  edema Neuro: alert & orientedx3, cranial nerves grossly intact. Moves all 4 extremities w/o difficulty. Affect pleasant.   ASSESSMENT & PLAN:  1. Chronic Systolic Heart Failure ICM ECHO EF 20% NYHA III. Volume status stable. Continue torsemide 40 mg daily and can take an extra 20 mg torsemide for 3 pound weight gain.  Add 3.125 mg carvedilol twice a  Day Continue hydralazine 12.5 tid and increase imdur to 30 daily Check BMET  Provided with weight chart. Reinforced daily weights, low salt diet, and limiting fluids to < 2 liters per day.Provided with pill box.  2. CAD S/P Recent STEMI at [facility] late 03/2014 presenting  No evidence of ischemia. On apixaban 2.5 mg twice a day so no aspirin.  3. A fib - PAF. Regular rhythm. Continue apixaban. Rate controlled.    Follow up in 1 month   CLEGG,[name] NP-C  1:55 PM

## 2014-05-19 ENCOUNTER — Telehealth (HOSPITAL_COMMUNITY): Payer: Self-pay | Admitting: Cardiology

## 2014-05-19 DIAGNOSIS — I4891 Unspecified atrial fibrillation: Secondary | ICD-10-CM | POA: Insufficient documentation

## 2014-05-19 DIAGNOSIS — I5022 Chronic systolic (congestive) heart failure: Secondary | ICD-10-CM | POA: Insufficient documentation

## 2014-05-19 MED ORDER — CARVEDILOL 3.125 MG PO TABS: 3.1250 mg | ORAL_TABLET | Freq: Two times a day (BID) | ORAL | Status: DC

## 2014-05-19 NOTE — Telephone Encounter (Signed)
As requested rx sent into pharmacy

## 2014-06-15 ENCOUNTER — Ambulatory Visit (HOSPITAL_COMMUNITY)
Admission: RE | Admit: 2014-06-15 | Discharge: 2014-06-15 | Disposition: A | Discharge status: Home / Self Care | Payer: Medicare Other | Source: Ambulatory Visit | Attending: Internal Medicine | Admitting: Internal Medicine

## 2014-06-15 VITALS — BP 104/52 | HR 62 | Wt 116.8 lb

## 2014-06-15 DIAGNOSIS — I2583 Coronary atherosclerosis due to lipid rich plaque: Secondary | ICD-10-CM

## 2014-06-15 DIAGNOSIS — I480 Paroxysmal atrial fibrillation: Secondary | ICD-10-CM

## 2014-06-15 DIAGNOSIS — I5022 Chronic systolic (congestive) heart failure: Secondary | ICD-10-CM | POA: Diagnosis present

## 2014-06-15 DIAGNOSIS — I2510 Atherosclerotic heart disease of native coronary artery without angina pectoris: Secondary | ICD-10-CM | POA: Insufficient documentation

## 2014-06-15 LAB — BASIC METABOLIC PANEL
ANION GAP: 8 (ref 5–15)
BUN: 35 mg/dL — AB (ref 6–23)
CALCIUM: 9.1 mg/dL (ref 8.4–10.5)
CHLORIDE: 99 meq/L (ref 96–112)
CO2: 35 mmol/L — ABNORMAL HIGH (ref 19–32)
Creatinine, Ser: 2.26 mg/dL — ABNORMAL HIGH (ref 0.50–1.10)
GFR calc Af Amer: 21 mL/min — ABNORMAL LOW (ref >90)
GFR calc non Af Amer: 18 mL/min — ABNORMAL LOW (ref >90)
GLUCOSE: 112 mg/dL — AB (ref 70–99)
Potassium: 3.8 mmol/L (ref 3.5–5.1)
Sodium: 142 mmol/L (ref 135–145)

## 2014-06-15 LAB — BRAIN NATRIURETIC PEPTIDE: B Natriuretic Peptide: 3172.6 pg/mL — ABNORMAL HIGH (ref 0.0–100.0)

## 2014-06-15 MED ORDER — METOLAZONE 2.5 MG PO TABS: 2.5000 mg | ORAL_TABLET | ORAL | Status: DC | PRN

## 2014-06-15 MED ORDER — POTASSIUM CHLORIDE CRYS ER 20 MEQ PO TBCR: 20.0000 meq | EXTENDED_RELEASE_TABLET | ORAL | Status: DC | PRN

## 2014-06-15 MED ORDER — ISOSORBIDE MONONITRATE ER 30 MG PO TB24: 30.0000 mg | ORAL_TABLET | Freq: Every day | ORAL | Status: DC

## 2014-06-15 NOTE — Progress Notes (Signed)
Patient ID: Leah Kramer, female   DOB: 1923/12/09, 81 y.o.   MRN: [id number] PCP: Dr Herma Ard   HPI: 81 year old female with history of heart failure, chronic kidney disease, hypertension, diabetes, CAD.  Per Care Everywhere she presented to [facility] in October with late STEMI. Treated medically. EF 20% and severe MR. Has been struggling with volume management in setting on chronic renal failure.   TTE 03/2014 [facility]): Severe LV dysfunction (EF 20%) with basal inferior, basal anterospetal, and basal anterior hypokinesis, dilated LV, diastolic dysfunction, elevated LV filling pressures, degen MV with severe MR, dilated LA, moderate AR, severe pulmonary hypertension, normal RV function, mild TR, elevated CVP  Admitted 12/4 with respiratory failure due to recurrent HF.  Required short tem milrionone and diuresed with IV lasix.She transitioned to torsemide.  She had short episode of A fib and converted spontaneously. Discharge weight was 117 pounds.   She returns for follow up. Last visit she was started on 3.125 mg carvedilol twice a day and imdur was increased to 30 mg daily. She was discharged from SNF on 1/4 of 30 mg imdur tablet. Overall feeling ok. Functional decline noted. Increased dyspnea with exertion. Now sleeping in a recliner. Had chest pain a couple of times relieved with nitroglycerin. Following low salt diet and limiting fluid   Weight at  Home 112-116 pounds.  HH follow for HHRN and HHPT.    ROS: All systems negative except as listed in HPI, PMH and Problem List.  Past Medical History  Diagnosis Date  . CKD (chronic kidney disease)   . CHF (congestive heart failure)   . STEMI (ST elevation myocardial infarction)   . Hypertension   . Diabetes mellitus without complication   . Difficulty walking   . Overactive bladder   . Hyperlipidemia     Current Outpatient Prescriptions  Medication Sig Dispense Refill  . apixaban (ELIQUIS) 2.5 MG TABS tablet Take 1 tablet (2.5 mg total)  by mouth 2 (two) times daily. 60 tablet 2  . calcium carbonate (TUMS - DOSED IN MG ELEMENTAL CALCIUM) 500 MG chewable tablet Chew 1 tablet by mouth 3 (three) times daily as needed for indigestion or heartburn.    . calcium-vitamin D (OSCAL) 250-125 MG-UNIT per tablet Take 1 tablet by mouth daily.    . carvedilol (COREG) 3.125 MG tablet Take 1 tablet (3.125 mg total) by mouth 2 (two) times daily. 180 tablet 3  . cholecalciferol (VITAMIN D) 400 UNITS TABS tablet Take 400 Units by mouth daily.    . cyanocobalamin (,VITAMIN B-12,) 1000 MCG/ML injection Inject 1,000 mcg into the muscle every 30 (thirty) days.    . hydrALAZINE (APRESOLINE) 25 MG tablet Take 0.5 tablets (12.5 mg total) by mouth every 8 (eight) hours. 180 tablet 0  . isosorbide mononitrate (IMDUR) 30 MG 24 hr tablet Take 1 tablet (30 mg total) by mouth daily. 90 tablet 3  . metolazone (ZAROXOLYN) 2.5 MG tablet Take 1 tablet (2.5 mg total) by mouth daily. (Patient taking differently: Take 2.5 mg by mouth daily. If weight grater than 118lb) 10 tablet 0  . nitroGLYCERIN (NITROSTAT) 0.4 MG SL tablet Place 0.4 mg under the tongue every 5 (five) minutes as needed for chest pain.    Marland Kitchen omeprazole (PRILOSEC) 20 MG capsule Take 20 mg by mouth 2 (two) times daily.    Marland Kitchen oxybutynin (DITROPAN-XL) 10 MG 24 hr tablet Take 10 mg by mouth every morning.    . polyethylene glycol (MIRALAX / GLYCOLAX) packet Take 17  g by mouth daily.    . simvastatin (ZOCOR) 20 MG tablet Take 20 mg by mouth daily.    Marland Kitchen torsemide (DEMADEX) 20 MG tablet Take 2 tablets (40 mg total) by mouth daily. 60 tablet 0  . vitamin E 400 UNIT capsule Take 400 Units by mouth daily.     No current facility-administered medications for this encounter.     PHYSICAL EXAM: Filed Vitals:   06/15/14 1350  BP: 104/52  Pulse: 62  Weight: 116 lb 12 oz (52.957 kg)  SpO2: 94%    General:  Chronically ill appearing. No resp difficulty. Arrived in wheelchair with her husband and daughter. Marland Kitchen   HEENT: normal Neck: supple. JVP 9-10 . Carotids 2+ bilaterally; no bruits. No lymphadenopathy or thryomegaly appreciated. Cor: PMI normal. Regular rate & rhythm. No rubs, gallops or murmurs. Lungs: clear Abdomen: soft, nontender, distended. No hepatosplenomegaly. No bruits or masses. Good bowel sounds. Extremities: no cyanosis, clubbing, rash, R and LLE 1-2+ edema Neuro: alert & orientedx3, cranial nerves grossly intact. Moves all 4 extremities w/o difficulty. Affect pleasant.   ASSESSMENT & PLAN:  1. Chronic Systolic Heart Failure ICM ECHO EF 20% NYHA IIIb. More symptomatic today with orthopnea. Dry weight seems to be closer to 111-112 pounds. She has a narrow euvolemic window. Volume status increased. Continue torsemide 40 mg daily and I have asked her to take 2.5 mg metolazone today with 40 meq potassium. I have also asked her to take 2.5 mg metolazone for weight 116 pounds or greater.  Continue 3.125 mg carvedilol twice a day. Will not up titrate as HR is 62.  Continue hydralazine 12.5 tid and increase imdur to 30 daily. (she was only taking 1/4 tablet)  Provided with weight chart. Reinforced daily weights, low salt diet, and limiting fluids to < 2 liters per day. 2. CAD S/P Recent STEMI at [facility] late 03/2014 presenting  No evidence of ischemia. On apixaban 2.5 mg twice a day so no aspirin.  3. A fib - PAF. Regular rhythm. Continue apixaban. Continue low dose carvedilol.     Follow up in 2 weeks . Check BMET today.   [name]  1:50 PM

## 2014-06-15 NOTE — Patient Instructions (Addendum)
Increase Imdur (Isosorbide) to 30 mg (1 tablet) daily  Take Metolazone 2.5 mg today and when your weight is 116 lb or greater  Take Potassium (k-dur) 40 meq (2 tabs) TODAY, then take 1 tab whenever you take Metolazone  Labs today  Your physician recommends that you schedule a follow-up appointment in: 2 weeks

## 2014-06-21 ENCOUNTER — Telehealth (HOSPITAL_COMMUNITY): Payer: Self-pay | Admitting: Vascular Surgery

## 2014-06-21 NOTE — Telephone Encounter (Signed)
Per VO [name] Clegg,NP Pt should take 2.5mg  metolazone today

## 2014-06-21 NOTE — Telephone Encounter (Signed)
Pt called she feels bloated and SOB.. Pt wants to know if she needs to take a super pill.. Please advise

## 2014-06-29 ENCOUNTER — Ambulatory Visit (HOSPITAL_COMMUNITY)
Admission: RE | Admit: 2014-06-29 | Discharge: 2014-06-29 | Disposition: A | Discharge status: Home / Self Care | Payer: Medicare Other | Source: Ambulatory Visit | Attending: Cardiology | Admitting: Cardiology

## 2014-06-29 ENCOUNTER — Ambulatory Visit (HOSPITAL_COMMUNITY)
Admission: RE | Admit: 2014-06-29 | Discharge: 2014-06-29 | Disposition: A | Discharge status: Home / Self Care | Payer: Medicare Other | Source: Ambulatory Visit | Attending: Adult Health | Admitting: Adult Health

## 2014-06-29 ENCOUNTER — Encounter (HOSPITAL_COMMUNITY): Payer: Self-pay

## 2014-06-29 VITALS — BP 107/45 | HR 64 | Resp 18 | Wt 117.0 lb

## 2014-06-29 DIAGNOSIS — I2510 Atherosclerotic heart disease of native coronary artery without angina pectoris: Secondary | ICD-10-CM | POA: Diagnosis not present

## 2014-06-29 DIAGNOSIS — I5022 Chronic systolic (congestive) heart failure: Secondary | ICD-10-CM | POA: Insufficient documentation

## 2014-06-29 DIAGNOSIS — R0600 Dyspnea, unspecified: Secondary | ICD-10-CM

## 2014-06-29 DIAGNOSIS — I480 Paroxysmal atrial fibrillation: Secondary | ICD-10-CM | POA: Insufficient documentation

## 2014-06-29 MED ORDER — METOLAZONE 2.5 MG PO TABS: 2.5000 mg | ORAL_TABLET | ORAL | Status: DC

## 2014-06-29 NOTE — Patient Instructions (Signed)
Take 1 Metolazone tablet every Thursday. May take 1 extra during the week for weight gain, edema (swelling), and/or shortness of breath.  Follow up 2 weeks.  Do the following things EVERYDAY: 1) Weigh yourself in the morning before breakfast. Write it down and keep it in a log. 2) Take your medicines as prescribed 3) Eat low salt foods-Limit salt (sodium) to 2000 mg per day.  4) Stay as active as you can everyday 5) Limit all fluids for the day to less than 2 liters

## 2014-06-29 NOTE — Progress Notes (Signed)
Patient ID: Leah Kramer, female   DOB: Jan 05, 1924, 81 y.o.   MRN: [id number] PCP: Dr Herma Ard   HPI: 81 year old female with history of heart failure, chronic kidney disease, hypertension, diabetes, CAD.  Per Care Everywhere she presented to [facility] in October with late STEMI. Treated medically. EF 20% and severe MR. Has been struggling with volume management in setting on chronic renal failure.   TTE 03/2014 [facility]): Severe LV dysfunction (EF 20%) with basal inferior, basal anterospetal, and basal anterior hypokinesis, dilated LV, diastolic dysfunction, elevated LV filling pressures, degen MV with severe MR, dilated LA, moderate AR, severe pulmonary hypertension, normal RV function, mild TR, elevated CVP  Admitted 12/4 with respiratory failure due to recurrent HF.  Required short tem milrionone and diuresed with IV lasix.She transitioned to torsemide.  She had short episode of A fib and converted spontaneously. Discharge weight was 117 pounds.   She returns for follow up. Last visit she had mild volume overload and she was instructed to take a metolazone. Overall feeling SOB and fatigued. Weight at home 112-114 pounds. Denies CP/PND/Orthopnea. Does admit she is sleeping in a recliner.  [place] follow for [facility] and HHPT.    Labs 06/15/14: K 3.8 Creatinine 2.26   ROS: All systems negative except as listed in HPI, PMH and Problem List.  Past Medical History  Diagnosis Date  . CKD (chronic kidney disease)   . CHF (congestive heart failure)   . STEMI (ST elevation myocardial infarction)   . Hypertension   . Diabetes mellitus without complication   . Difficulty walking   . Overactive bladder   . Hyperlipidemia     Current Outpatient Prescriptions  Medication Sig Dispense Refill  . apixaban (ELIQUIS) 2.5 MG TABS tablet Take 1 tablet (2.5 mg total) by mouth 2 (two) times daily. 60 tablet 2  . calcium carbonate (TUMS - DOSED IN MG ELEMENTAL CALCIUM) 500 MG chewable tablet Chew 1 tablet  by mouth 3 (three) times daily as needed for indigestion or heartburn.    . calcium-vitamin D (OSCAL) 250-125 MG-UNIT per tablet Take 1 tablet by mouth daily.    . carvedilol (COREG) 3.125 MG tablet Take 1 tablet (3.125 mg total) by mouth 2 (two) times daily. 180 tablet 3  . cholecalciferol (VITAMIN D) 400 UNITS TABS tablet Take 400 Units by mouth daily.    . cyanocobalamin (,VITAMIN B-12,) 1000 MCG/ML injection Inject 1,000 mcg into the muscle every 30 (thirty) days.    . hydrALAZINE (APRESOLINE) 25 MG tablet Take 0.5 tablets (12.5 mg total) by mouth every 8 (eight) hours. 180 tablet 0  . isosorbide mononitrate (IMDUR) 30 MG 24 hr tablet Take 1 tablet (30 mg total) by mouth daily. 30 tablet 3  . metolazone (ZAROXOLYN) 2.5 MG tablet Take 1 tablet (2.5 mg total) by mouth as needed. For weight 116 lb or greater 10 tablet 0  . nitroGLYCERIN (NITROSTAT) 0.4 MG SL tablet Place 0.4 mg under the tongue every 5 (five) minutes as needed for chest pain.    Marland Kitchen omeprazole (PRILOSEC) 20 MG capsule Take 20 mg by mouth 2 (two) times daily.    Marland Kitchen oxybutynin (DITROPAN-XL) 10 MG 24 hr tablet Take 10 mg by mouth every morning.    . polyethylene glycol (MIRALAX / GLYCOLAX) packet Take 17 g by mouth daily.    . potassium chloride SA (K-DUR,KLOR-CON) 20 MEQ tablet Take 1 tablet (20 mEq total) by mouth as needed. Take when you take Metolazone 30 tablet 3  .  simvastatin (ZOCOR) 20 MG tablet Take 20 mg by mouth daily.    Marland Kitchen torsemide (DEMADEX) 20 MG tablet Take 2 tablets (40 mg total) by mouth daily. 60 tablet 0  . vitamin E 400 UNIT capsule Take 400 Units by mouth daily.     No current facility-administered medications for this encounter.     PHYSICAL EXAM: Filed Vitals:   06/29/14 1425  BP: 107/45  Pulse: 64  Resp: 18  Weight: 117 lb (53.071 kg)  SpO2: 95%    General:  Chronically ill appearing. No resp difficulty. Arrived in wheelchair with her husband and daughter. Marland Kitchen  HEENT: normal Neck: supple. JVP ~10  .  Carotids 2+ bilaterally; no bruits. No lymphadenopathy or thryomegaly appreciated. Cor: PMI normal. Regular rate & rhythm. No rubs, gallops or murmurs. Lungs: RLL LLL crackles LLL dull Abdomen: soft, nontender, ++ distended. No hepatosplenomegaly. No bruits or masses. Good bowel sounds. Extremities: no cyanosis, clubbing, rash, Cool  R and LLE 2+ edema Neuro: alert & orientedx3, cranial nerves grossly intact. Moves all 4 extremities w/o difficulty. Affect pleasant.   ASSESSMENT & PLAN:  1. Chronic Systolic Heart Failure ICM ECHO EF 20% NYHA IIIb ongoing dyspnea with exertion. Dry weight seems to be closer to 110-111 pounds. She has a narrow euvolemic window. Volume status increased. Continue torsemide 40 mg daily and add 2.5 mg metolazone weekly. Continue 40 meq potassium.  Continue 3.125 mg carvedilol twice a day. Will not up titrate as HR is 64.  Continue hydralazine 12.5 tid and continue imdur to 30 daily.  Provided with weight chart. Reinforced daily weights, low salt diet, and limiting fluids to < 2 liters per day. Discussed possible home milrinone 2. CAD S/P Recent STEMI at [facility] late 03/2014 presenting  No evidence of ischemia. On apixaban 2.5 mg twice a day so no aspirin.  3. A fib - PAF. Regular rhythm. Continue apixaban. Continue low dose carvedilol.     Follow up in 2 weeks .   CLEGG,[name] NP-C  2:37 PM  Patient seen and examined with Darrick Grinder, NP. We discussed all aspects of the encounter. I agree with the assessment and plan as stated above.   She remains very tenuous with NYHA IIB symptoms, volume overload and worsening renal function. She has a very narrow euvolemic window. We discussed the possibility of home inotropes but she wants to try to avoid if possible for now. Will use metolazone 2.5 mg once per week to help with fluid status. Can take 2x/week if need. If needs more than that or is having worsening sx she should call us. Will consider increasing hydralazine at next  visit watching carefull for hypotension.   Total time spent 45 minutes. Over half that time spent discussing above.   Lillith Mcneff,MD 3:06 PM

## 2014-06-30 ENCOUNTER — Telehealth (HOSPITAL_COMMUNITY): Payer: Self-pay | Admitting: *Deleted

## 2014-06-30 NOTE — Telephone Encounter (Signed)
Pt's last 2 ov notes and labs faxed to pcp Dr Chelsea Primus at [phone] per pt's request

## 2014-07-03 ENCOUNTER — Other Ambulatory Visit (HOSPITAL_COMMUNITY): Payer: Self-pay | Admitting: Cardiology

## 2014-07-03 MED ORDER — OXYBUTYNIN CHLORIDE ER 10 MG PO TB24: 10.0000 mg | ORAL_TABLET | Freq: Every morning | ORAL | Status: AC

## 2014-07-03 MED ORDER — TORSEMIDE 20 MG PO TABS: 40.0000 mg | ORAL_TABLET | Freq: Every day | ORAL | Status: DC

## 2014-07-03 MED ORDER — SIMVASTATIN 20 MG PO TABS: 20.0000 mg | ORAL_TABLET | Freq: Every day | ORAL | Status: AC

## 2014-07-03 NOTE — Telephone Encounter (Signed)
Fax received from pharmacy requesting simvastatin and oxybutin Simvastatin returned however oxybutuin refilld x 1, additional refills should come from pcp

## 2014-07-05 NOTE — Telephone Encounter (Signed)
Unable to contact pt after multiple attempts  Ok to close encounter

## 2014-07-12 ENCOUNTER — Ambulatory Visit (HOSPITAL_COMMUNITY)
Admission: RE | Admit: 2014-07-12 | Discharge: 2014-07-12 | Disposition: A | Discharge status: Home / Self Care | Payer: Medicare Other | Source: Ambulatory Visit | Attending: Cardiology | Admitting: Cardiology

## 2014-07-12 ENCOUNTER — Encounter (HOSPITAL_COMMUNITY): Payer: Self-pay

## 2014-07-12 VITALS — BP 80/51 | HR 58 | Resp 18 | Wt 113.2 lb

## 2014-07-12 DIAGNOSIS — I5022 Chronic systolic (congestive) heart failure: Secondary | ICD-10-CM | POA: Diagnosis present

## 2014-07-12 DIAGNOSIS — I2510 Atherosclerotic heart disease of native coronary artery without angina pectoris: Secondary | ICD-10-CM | POA: Diagnosis not present

## 2014-07-12 DIAGNOSIS — N184 Chronic kidney disease, stage 4 (severe): Secondary | ICD-10-CM

## 2014-07-12 DIAGNOSIS — I480 Paroxysmal atrial fibrillation: Secondary | ICD-10-CM

## 2014-07-12 NOTE — Progress Notes (Signed)
Patient ID: Leah Kramer, female   DOB: 1924/01/01, 81 y.o.   MRN: [id number] PCP: Dr Herma Ard   HPI: 81 year old female with history of heart failure, chronic kidney disease, hypertension, diabetes, CAD.  Per Care Everywhere she presented to [facility] in October with late STEMI. Treated medically. EF 20% and severe MR. Has been struggling with volume management in setting on chronic renal failure.   TTE 03/2014 [facility]): Severe LV dysfunction (EF 20%) with basal inferior, basal anterospetal, and basal anterior hypokinesis, dilated LV, diastolic dysfunction, elevated LV filling pressures, degen MV with severe MR, dilated LA, moderate AR, severe pulmonary hypertension, normal RV function, mild TR, elevated CVP  Admitted 12/4 with respiratory failure due to recurrent HF.  Required short tem milrionone and diuresed with IV lasix.She transitioned to torsemide.  She had short episode of A fib and converted spontaneously. Discharge weight was 117 pounds.   She returns for follow up. Last visit she had mild volume overload and she was instructed to take a metolazone once a week. Overall feeling much better. Able to do more around the house. Mild dyspnea with exertion. More energy. Able to sleep in her bed.  Weight at home 109-110.   [place] follow for [facility] and HHPT.  No BRBPR  Labs 06/15/14: K 3.8 Creatinine 2.26  Labs 07/05/2014 : K 3.6 Creatinine 2.1   ROS: All systems negative except as listed in HPI, PMH and Problem List.  Past Medical History  Diagnosis Date  . CKD (chronic kidney disease)   . CHF (congestive heart failure)   . STEMI (ST elevation myocardial infarction)   . Hypertension   . Diabetes mellitus without complication   . Difficulty walking   . Overactive bladder   . Hyperlipidemia     Current Outpatient Prescriptions  Medication Sig Dispense Refill  . apixaban (ELIQUIS) 2.5 MG TABS tablet Take 1 tablet (2.5 mg total) by mouth 2 (two) times daily. 60 tablet 2  . calcium  carbonate (TUMS - DOSED IN MG ELEMENTAL CALCIUM) 500 MG chewable tablet Chew 1 tablet by mouth 3 (three) times daily as needed for indigestion or heartburn.    . calcium-vitamin D (OSCAL) 250-125 MG-UNIT per tablet Take 1 tablet by mouth daily.    . carvedilol (COREG) 3.125 MG tablet Take 1 tablet (3.125 mg total) by mouth 2 (two) times daily. 180 tablet 3  . cholecalciferol (VITAMIN D) 400 UNITS TABS tablet Take 400 Units by mouth daily.    . cyanocobalamin (,VITAMIN B-12,) 1000 MCG/ML injection Inject 1,000 mcg into the muscle every 30 (thirty) days.    . hydrALAZINE (APRESOLINE) 25 MG tablet Take 0.5 tablets (12.5 mg total) by mouth every 8 (eight) hours. 180 tablet 0  . isosorbide mononitrate (IMDUR) 30 MG 24 hr tablet Take 1 tablet (30 mg total) by mouth daily. 30 tablet 3  . metolazone (ZAROXOLYN) 2.5 MG tablet Take 1 tablet (2.5 mg total) by mouth once a week. Thursday.  May take 1 extra tablet for weight gain, edema, and/or shortness of breath. 10 tablet 3  . nitroGLYCERIN (NITROSTAT) 0.4 MG SL tablet Place 0.4 mg under the tongue every 5 (five) minutes as needed for chest pain.    Marland Kitchen omeprazole (PRILOSEC) 20 MG capsule Take 20 mg by mouth 2 (two) times daily.    Marland Kitchen oxybutynin (DITROPAN-XL) 10 MG 24 hr tablet Take 1 tablet (10 mg total) by mouth every morning. 30 tablet 0  . polyethylene glycol (MIRALAX / GLYCOLAX) packet Take 17  g by mouth daily.    . potassium chloride SA (K-DUR,KLOR-CON) 20 MEQ tablet Take 1 tablet (20 mEq total) by mouth as needed. Take when you take Metolazone 30 tablet 3  . simvastatin (ZOCOR) 20 MG tablet Take 1 tablet (20 mg total) by mouth daily. 30 tablet 2  . torsemide (DEMADEX) 20 MG tablet Take 2 tablets (40 mg total) by mouth daily. 60 tablet 3  . vitamin E 400 UNIT capsule Take 400 Units by mouth daily.     No current facility-administered medications for this encounter.     PHYSICAL EXAM: Filed Vitals:   07/12/14 1446  BP: 80/51  Pulse: 58  Resp: 18   Weight: 113 lb 4 oz (51.37 kg)  SpO2: 95%    General:  Chronically ill appearing. No resp difficulty. Arrived in wheelchair with her husband and daughter. Marland Kitchen  HEENT: normal Neck: supple. JVP 6-7  . Carotids 2+ bilaterally; no bruits. No lymphadenopathy or thryomegaly appreciated. Cor: PMI normal. Regular rate & rhythm. No rubs, gallops or murmurs. Lungs: clear.  Abdomen: soft, nontender, nondistended. No hepatosplenomegaly. No bruits or masses. Good bowel sounds. Extremities: no cyanosis, clubbing, rash,   R and LLE no edema Neuro: alert & orientedx3, cranial nerves grossly intact. Moves all 4 extremities w/o difficulty. Affect pleasant.   ASSESSMENT & PLAN:  1. Chronic Systolic Heart Failure ICM ECHO EF 20% NYHA IIIb . Much improved.Dry weight 109-111 pounds. She has a narrow euvolemic window. Volume status stable. Appear euvolemic. Continue torsemide 40 mg daily and weekly metolazone. Continue 40 meq potassium.  BP soft but not dizzy. Continue current regimen.  Continue 3.125 mg carvedilol twice a day. Continue hydralazine 12.5 tid and continue imdur to 30 daily.  Provided with weight chart. Reinforced daily weights, low salt diet, and limiting fluids to < 2 liters per day. 2. CAD S/P Recent STEMI at [facility] late 03/2014 presenting  No evidence of ischemia. On apixaban 2.5 mg twice a day so no aspirin.  3. A fib - PAF. [place] Vasc Score= 5. Regular rhythm. Continue apixaban. Continue low dose carvedilol.  No bleeding.    Follow up in 4 weeks   [name]  3:02 PM

## 2014-07-12 NOTE — Patient Instructions (Signed)
Your physician recommends that you schedule a follow-up appointment in: 4 weeks  Do the following things EVERYDAY: 1) Weigh yourself in the morning before breakfast. Write it down and keep it in a log. 2) Take your medicines as prescribed 3) Eat low salt foods-Limit salt (sodium) to 2000 mg per day.  4) Stay as active as you can everyday 5) Limit all fluids for the day to less than 2 liters 6)

## 2014-07-25 ENCOUNTER — Encounter: Payer: Self-pay | Admitting: Internal Medicine

## 2014-07-28 ENCOUNTER — Other Ambulatory Visit (HOSPITAL_COMMUNITY): Payer: Self-pay | Admitting: *Deleted

## 2014-08-09 ENCOUNTER — Encounter (HOSPITAL_COMMUNITY): Payer: Self-pay

## 2014-08-09 ENCOUNTER — Ambulatory Visit (HOSPITAL_COMMUNITY)
Admission: RE | Admit: 2014-08-09 | Discharge: 2014-08-09 | Disposition: A | Discharge status: Home / Self Care | Payer: Medicare Other | Source: Ambulatory Visit | Attending: Internal Medicine | Admitting: Internal Medicine

## 2014-08-09 VITALS — BP 102/60 | HR 58 | Wt 112.2 lb

## 2014-08-09 DIAGNOSIS — N189 Chronic kidney disease, unspecified: Secondary | ICD-10-CM | POA: Diagnosis not present

## 2014-08-09 DIAGNOSIS — E785 Hyperlipidemia, unspecified: Secondary | ICD-10-CM | POA: Insufficient documentation

## 2014-08-09 DIAGNOSIS — I252 Old myocardial infarction: Secondary | ICD-10-CM | POA: Diagnosis not present

## 2014-08-09 DIAGNOSIS — I5020 Unspecified systolic (congestive) heart failure: Secondary | ICD-10-CM

## 2014-08-09 DIAGNOSIS — I5022 Chronic systolic (congestive) heart failure: Secondary | ICD-10-CM

## 2014-08-09 DIAGNOSIS — Z7901 Long term (current) use of anticoagulants: Secondary | ICD-10-CM | POA: Diagnosis not present

## 2014-08-09 DIAGNOSIS — I2510 Atherosclerotic heart disease of native coronary artery without angina pectoris: Secondary | ICD-10-CM | POA: Diagnosis not present

## 2014-08-09 DIAGNOSIS — I480 Paroxysmal atrial fibrillation: Secondary | ICD-10-CM | POA: Diagnosis not present

## 2014-08-09 DIAGNOSIS — E119 Type 2 diabetes mellitus without complications: Secondary | ICD-10-CM | POA: Diagnosis not present

## 2014-08-09 DIAGNOSIS — I129 Hypertensive chronic kidney disease with stage 1 through stage 4 chronic kidney disease, or unspecified chronic kidney disease: Secondary | ICD-10-CM | POA: Insufficient documentation

## 2014-08-09 DIAGNOSIS — Z79899 Other long term (current) drug therapy: Secondary | ICD-10-CM | POA: Diagnosis not present

## 2014-08-09 LAB — BASIC METABOLIC PANEL
Anion gap: 11 (ref 5–15)
BUN: 45 mg/dL — ABNORMAL HIGH (ref 6–23)
CO2: 33 mmol/L — ABNORMAL HIGH (ref 19–32)
CREATININE: 1.93 mg/dL — AB (ref 0.50–1.10)
Calcium: 9.1 mg/dL (ref 8.4–10.5)
Chloride: 95 mmol/L — ABNORMAL LOW (ref 96–112)
GFR calc Af Amer: 26 mL/min — ABNORMAL LOW (ref >90)
GFR, EST NON AFRICAN AMERICAN: 22 mL/min — AB (ref >90)
GLUCOSE: 123 mg/dL — AB (ref 70–99)
Potassium: 3.6 mmol/L (ref 3.5–5.1)
Sodium: 139 mmol/L (ref 135–145)

## 2014-08-09 NOTE — Patient Instructions (Signed)
Doing great!  Follow up 4 weeks.  Do the following things EVERYDAY: 1) Weigh yourself in the morning before breakfast. Write it down and keep it in a log. 2) Take your medicines as prescribed 3) Eat low salt foods-Limit salt (sodium) to 2000 mg per day.  4) Stay as active as you can everyday 5) Limit all fluids for the day to less than 2 liters  

## 2014-08-09 NOTE — Progress Notes (Signed)
Patient ID: Leah Kramer, female   DOB: August 12, 1923, 81 y.o.   MRN: [id number] PCP: Dr Herma Ard   HPI: 81 year old female with history of heart failure, chronic kidney disease, hypertension, diabetes, CAD.  Per Care Everywhere she presented to [facility] in October with late STEMI. Treated medically. EF 20% and severe MR. Has been struggling with volume management in setting on chronic renal failure.   TTE 03/2014 [facility]): Severe LV dysfunction (EF 20%) with basal inferior, basal anterospetal, and basal anterior hypokinesis, dilated LV, diastolic dysfunction, elevated LV filling pressures, degen MV with severe MR, dilated LA, moderate AR, severe pulmonary hypertension, normal RV function, mild TR, elevated CVP  Admitted 12/4 with respiratory failure due to recurrent HF.  Required short tem milrionone and diuresed with IV lasix.She transitioned to torsemide.  She had short episode of A fib and converted spontaneously. Discharge weight was 117 pounds.   She returns for follow up. Last visit she had volume overload and had extra metolazone. Has difficulty sleeping. Feels bad in the morning but feels better after she takes medications. Ongoing dyspnea with exertion. Sleeping in the recliner on and off. Denies PND/CP. Weight at home 110-112 pounds. Had metolazone 1-2 times a week.  Following low salt diet and limiting fluids.   [facility] follow for [facility] and HHPT.    ROS: All systems negative except as listed in HPI, PMH and Problem List.  Past Medical History  Diagnosis Date  . CKD (chronic kidney disease)   . CHF (congestive heart failure)   . STEMI (ST elevation myocardial infarction)   . Hypertension   . Diabetes mellitus without complication   . Difficulty walking   . Overactive bladder   . Hyperlipidemia     Current Outpatient Prescriptions  Medication Sig Dispense Refill  . apixaban (ELIQUIS) 2.5 MG TABS tablet Take 1 tablet (2.5 mg total) by mouth 2 (two) times daily. 60 tablet 2  . calcium  carbonate (TUMS - DOSED IN MG ELEMENTAL CALCIUM) 500 MG chewable tablet Chew 1 tablet by mouth 3 (three) times daily as needed for indigestion or heartburn.    . calcium-vitamin D (OSCAL) 250-125 MG-UNIT per tablet Take 1 tablet by mouth daily.    . carvedilol (COREG) 3.125 MG tablet Take 1 tablet (3.125 mg total) by mouth 2 (two) times daily. 180 tablet 3  . cholecalciferol (VITAMIN D) 400 UNITS TABS tablet Take 400 Units by mouth daily.    . cyanocobalamin (,VITAMIN B-12,) 1000 MCG/ML injection Inject 1,000 mcg into the muscle every 30 (thirty) days.    . hydrALAZINE (APRESOLINE) 25 MG tablet Take 0.5 tablets (12.5 mg total) by mouth every 8 (eight) hours. 180 tablet 0  . isosorbide mononitrate (IMDUR) 30 MG 24 hr tablet Take 1 tablet (30 mg total) by mouth daily. 30 tablet 3  . metolazone (ZAROXOLYN) 2.5 MG tablet Take 1 tablet (2.5 mg total) by mouth once a week. Thursday.  May take 1 extra tablet for weight gain, edema, and/or shortness of breath. 10 tablet 3  . nitroGLYCERIN (NITROSTAT) 0.4 MG SL tablet Place 0.4 mg under the tongue every 5 (five) minutes as needed for chest pain.    Marland Kitchen omeprazole (PRILOSEC) 20 MG capsule Take 20 mg by mouth 2 (two) times daily.    Marland Kitchen oxybutynin (DITROPAN-XL) 10 MG 24 hr tablet Take 1 tablet (10 mg total) by mouth every morning. 30 tablet 0  . polyethylene glycol (MIRALAX / GLYCOLAX) packet Take 17 g by mouth daily.    Marland Kitchen  potassium chloride SA (K-DUR,KLOR-CON) 20 MEQ tablet Take 1 tablet (20 mEq total) by mouth as needed. Take when you take Metolazone 30 tablet 3  . simvastatin (ZOCOR) 20 MG tablet Take 1 tablet (20 mg total) by mouth daily. 30 tablet 2  . torsemide (DEMADEX) 20 MG tablet Take 2 tablets (40 mg total) by mouth daily. 60 tablet 3  . vitamin E 400 UNIT capsule Take 400 Units by mouth daily.     No current facility-administered medications for this encounter.     PHYSICAL EXAM: Filed Vitals:   08/09/14 1419  BP: 102/60  Pulse: 58  Weight:  112 lb 4 oz (50.916 kg)  SpO2: 92%    General:  Chronically ill appearing. No resp difficulty. Arrived in wheelchair with her husband and daughter. Marland Kitchen  HEENT: normal Neck: supple. JVP7-8 . Carotids 2+ bilaterally; no bruits. No lymphadenopathy or thryomegaly appreciated. Cor: PMI normal. Regular rate & rhythm. No rubs, gallops or murmurs. Lungs: clear Abdomen: soft, nontender, distended. No hepatosplenomegaly. No bruits or masses. Good bowel sounds. Extremities: no cyanosis, clubbing, rash, R and LLE trace edema.  Neuro: alert & orientedx3, cranial nerves grossly intact. Moves all 4 extremities w/o difficulty. Affect pleasant.   ASSESSMENT & PLAN:  1. Chronic Systolic Heart Failure ICM ECHO EF 20% NYHA IIIb.  Dry weight111-112 pounds. She has a narrow euvolemic window. Volume status ok today. Continue torsemide 40 mg daily.  I have also asked her to take 2.5 mg metolazone for weight 116 pounds or greater.  Continue 3.125 mg carvedilol twice a day. Will not up titrate as HR is 62.  Continue hydralazine 12.5 tid and  imdur to 30 daily.  No Ace or spiro due to CKD.  Provided with weight chart. Reinforced daily weights, low salt diet, and limiting fluids to < 2 liters per day. 2. CAD S/P Recent STEMI at [facility] late 03/2014 presenting  No evidence of ischemia. On apixaban 2.5 mg twice a day so no aspirin.  3. A fib - PAF. Regular rhythm. Continue apixaban. Continue low dose carvedilol.     Follow up in 4  Weeks with an ECHO. Check BMET today.   CLEGG,[name] NP-C  1:31 PM

## 2014-08-22 ENCOUNTER — Telehealth (HOSPITAL_COMMUNITY): Payer: Self-pay | Admitting: Vascular Surgery

## 2014-08-22 NOTE — Telephone Encounter (Signed)
Spoke w/Leah Kramer, discussed pt's plan and next appt, she really wants [name] to see pt and discuss the echo results with them, she feels they usually see [name] and are comfortable with her, note made on pt's appt to see [name]

## 2014-08-22 NOTE — Telephone Encounter (Signed)
Pt daughter called she would like to speak to [name], she has some questions and concerns that she would like to talk to her about .Marland Kitchen Please advise

## 2014-08-31 ENCOUNTER — Other Ambulatory Visit (HOSPITAL_COMMUNITY): Payer: Self-pay | Admitting: Cardiology

## 2014-08-31 DIAGNOSIS — I5022 Chronic systolic (congestive) heart failure: Secondary | ICD-10-CM

## 2014-09-06 ENCOUNTER — Ambulatory Visit (HOSPITAL_BASED_OUTPATIENT_CLINIC_OR_DEPARTMENT_OTHER)
Admission: RE | Admit: 2014-09-06 | Discharge: 2014-09-06 | Disposition: A | Discharge status: Home / Self Care | Payer: Medicare Other | Source: Ambulatory Visit | Attending: Cardiology | Admitting: Cardiology

## 2014-09-06 ENCOUNTER — Ambulatory Visit (HOSPITAL_COMMUNITY)
Admission: RE | Admit: 2014-09-06 | Discharge: 2014-09-06 | Disposition: A | Discharge status: Home / Self Care | Payer: Medicare Other | Source: Ambulatory Visit | Attending: Internal Medicine | Admitting: Internal Medicine

## 2014-09-06 VITALS — BP 96/44 | HR 57 | Wt 109.1 lb

## 2014-09-06 DIAGNOSIS — I272 Other secondary pulmonary hypertension: Secondary | ICD-10-CM | POA: Diagnosis not present

## 2014-09-06 DIAGNOSIS — I5022 Chronic systolic (congestive) heart failure: Secondary | ICD-10-CM

## 2014-09-06 DIAGNOSIS — I509 Heart failure, unspecified: Secondary | ICD-10-CM | POA: Diagnosis not present

## 2014-09-06 DIAGNOSIS — I480 Paroxysmal atrial fibrillation: Secondary | ICD-10-CM

## 2014-09-06 DIAGNOSIS — I351 Nonrheumatic aortic (valve) insufficiency: Secondary | ICD-10-CM | POA: Diagnosis not present

## 2014-09-06 DIAGNOSIS — I2510 Atherosclerotic heart disease of native coronary artery without angina pectoris: Secondary | ICD-10-CM

## 2014-09-06 DIAGNOSIS — I2583 Coronary atherosclerosis due to lipid rich plaque: Secondary | ICD-10-CM

## 2014-09-06 MED ORDER — TORSEMIDE 20 MG PO TABS: 40.0000 mg | ORAL_TABLET | Freq: Every day | ORAL | Status: DC

## 2014-09-06 NOTE — Progress Notes (Signed)
  Echocardiogram 2D Echocardiogram has been performed.  Darlina Sicilian M 09/06/2014, 12:15 PM

## 2014-09-06 NOTE — Patient Instructions (Signed)
Hold Torsemide if weight is less than 107 lb  Your physician recommends that you schedule a follow-up appointment in: 6-8 weeks

## 2014-09-06 NOTE — Progress Notes (Signed)
Patient ID: Noelle Sease, female   DOB: June 05, 1923, 81 y.o.   MRN: [id number] PCP: Dr Herma Ard   HPI: 81 year old female with history of heart failure, chronic kidney disease, hypertension, diabetes, CAD.  Per Care Everywhere she presented to [facility] in October with late STEMI. Treated medically. EF 20% and severe MR. Has been struggling with volume management in setting on chronic renal failure.   TTE 03/2014 [facility]): Severe LV dysfunction (EF 20%) with basal inferior, basal anterospetal, and basal anterior hypokinesis, dilated LV, diastolic dysfunction, elevated LV filling pressures, degen MV with severe MR, dilated LA, moderate AR, severe pulmonary hypertension, normal RV function, mild TR, elevated CVP  Admitted 12/4 with respiratory failure due to recurrent HF.  Required short tem milrionone and diuresed with IV lasix.She transitioned to torsemide.  She had short episode of A fib and converted spontaneously. Discharge weight was 117 pounds.   She returns for follow up. Weight at home 107-111 pounds. Has had metolazone 3 times over the last months. Says today is a good day. Breathing ok today. Has good days and bad days. Most days she is limited by leg fatigue and dyspnea. Denies PND/Orthopnea.  Appetite fair.    HH follow for HHRN and HHPT.    ECHO 09/07/2014 EF 20% Grade II DD Mod Severe MR.   ROS: All systems negative except as listed in HPI, PMH and Problem List.  Past Medical History  Diagnosis Date  . CKD (chronic kidney disease)   . CHF (congestive heart failure)   . STEMI (ST elevation myocardial infarction)   . Hypertension   . Diabetes mellitus without complication   . Difficulty walking   . Overactive bladder   . Hyperlipidemia     Current Outpatient Prescriptions  Medication Sig Dispense Refill  . apixaban (ELIQUIS) 2.5 MG TABS tablet Take 1 tablet (2.5 mg total) by mouth 2 (two) times daily. 60 tablet 2  . calcium carbonate (TUMS - DOSED IN MG ELEMENTAL CALCIUM) 500  MG chewable tablet Chew 1 tablet by mouth 3 (three) times daily as needed for indigestion or heartburn.    . calcium-vitamin D (OSCAL) 250-125 MG-UNIT per tablet Take 1 tablet by mouth daily.    . carvedilol (COREG) 3.125 MG tablet Take 1 tablet (3.125 mg total) by mouth 2 (two) times daily. 180 tablet 3  . cholecalciferol (VITAMIN D) 400 UNITS TABS tablet Take 400 Units by mouth daily.    . cyanocobalamin (,VITAMIN B-12,) 1000 MCG/ML injection Inject 1,000 mcg into the muscle every 30 (thirty) days.    . hydrALAZINE (APRESOLINE) 25 MG tablet Take 0.5 tablets (12.5 mg total) by mouth every 8 (eight) hours. 180 tablet 0  . isosorbide mononitrate (IMDUR) 30 MG 24 hr tablet Take 1 tablet (30 mg total) by mouth daily. 30 tablet 3  . metolazone (ZAROXOLYN) 2.5 MG tablet Take 1 tablet (2.5 mg total) by mouth once a week. Thursday.  May take 1 extra tablet for weight gain, edema, and/or shortness of breath. 10 tablet 3  . nitroGLYCERIN (NITROSTAT) 0.4 MG SL tablet Place 0.4 mg under the tongue every 5 (five) minutes as needed for chest pain.    Marland Kitchen omeprazole (PRILOSEC) 20 MG capsule Take 20 mg by mouth 2 (two) times daily.    Marland Kitchen oxybutynin (DITROPAN-XL) 10 MG 24 hr tablet Take 1 tablet (10 mg total) by mouth every morning. 30 tablet 0  . polyethylene glycol (MIRALAX / GLYCOLAX) packet Take 17 g by mouth daily.    Marland Kitchen  potassium chloride SA (K-DUR,KLOR-CON) 20 MEQ tablet Take 1 tablet (20 mEq total) by mouth as needed. Take when you take Metolazone 30 tablet 3  . simvastatin (ZOCOR) 20 MG tablet Take 1 tablet (20 mg total) by mouth daily. 30 tablet 2  . torsemide (DEMADEX) 20 MG tablet Take 2 tablets (40 mg total) by mouth daily. 60 tablet 3  . vitamin E 400 UNIT capsule Take 400 Units by mouth daily.     No current facility-administered medications for this encounter.     PHYSICAL EXAM: Filed Vitals:   09/06/14 1123  BP: 96/44  Pulse: 57  Weight: 109 lb 1.9 oz (49.497 kg)  SpO2: 94%    General:   Chronically ill appearing. No resp difficulty. Arrived in wheelchair with her husband and daughter. Marland Kitchen  HEENT: normal Neck: supple. JVP7-8 . Carotids 2+ bilaterally; no bruits. No lymphadenopathy or thryomegaly appreciated. Cor: PMI normal. Regular rate & rhythm. No rubs, gallops or murmurs. Lungs: clear Abdomen: soft, nontender, distended. No hepatosplenomegaly. No bruits or masses. Good bowel sounds. Extremities: no cyanosis, clubbing, rash, R and LLE trace edema.  Neuro: alert & orientedx3, cranial nerves grossly intact. Moves all 4 extremities w/o difficulty. Affect pleasant.   ASSESSMENT & PLAN:  1. Chronic Systolic Heart Failure ICM ECHO today reviewed by Dr Aundra Dubin and remains at 20%. I discussed the results with her. She continues to struggle with dyspnea and fatigue.  NYHA IIIb.  She is not a candidate for advanced therapies due to her age. I discussed considering Hospice and they will consider if she declines.  Dry weight 111-112 pounds. She has a narrow euvolemic window.  Volume status ok today. Continue torsemide 40 mg daily.  Continue to take 2.5 mg metolazone for weight 116 pounds or greater. I have asked her hold torsemide for weight less than 107 pounds.  Continue 3.125 mg carvedilol twice a day. Will not up titrate as BP is soft.   Continue hydralazine 12.5 tid and  imdur to 30 daily.  No Ace or spiro due to CKD.  2. CAD S/P Recent STEMI at [facility] late 03/2014 presenting  No evidence of ischemia. On apixaban 2.5 mg twice a day so no aspirin.  3. A fib - PAF. Regular rhythm. Continue apixaban. Continue low dose carvedilol.     Follow up in 6-8 weeks.  Kirt Chew NP-C  11:38 AM

## 2014-09-12 ENCOUNTER — Telehealth (HOSPITAL_COMMUNITY): Payer: Self-pay | Admitting: Cardiology

## 2014-09-12 MED ORDER — METOLAZONE 2.5 MG PO TABS: 2.5000 mg | ORAL_TABLET | ORAL | Status: DC

## 2014-09-12 MED ORDER — POTASSIUM CHLORIDE CRYS ER 20 MEQ PO TBCR: 20.0000 meq | EXTENDED_RELEASE_TABLET | ORAL | Status: DC | PRN

## 2014-09-12 NOTE — Telephone Encounter (Signed)
pts daughter called with concerns about increased SOB, abdominal distension and elevated weight (111 lb) Pt is currently at [facility] would like to know if we can call in a Rx for metolazone and KCL Advised per last office note her dry weight is 111-112 and metolazone is weekly or prn Unsure if we will need labs   Will review with provider and call back shortly

## 2014-09-12 NOTE — Telephone Encounter (Signed)
Per vo Darrick Grinder, NP Ok to send rx  Daughter aware

## 2014-10-18 ENCOUNTER — Ambulatory Visit (HOSPITAL_COMMUNITY)
Admission: RE | Admit: 2014-10-18 | Discharge: 2014-10-18 | Disposition: A | Discharge status: Home / Self Care | Payer: Medicare Other | Source: Ambulatory Visit | Attending: Internal Medicine | Admitting: Internal Medicine

## 2014-10-18 VITALS — BP 96/52 | HR 64 | Wt 110.0 lb

## 2014-10-18 DIAGNOSIS — I252 Old myocardial infarction: Secondary | ICD-10-CM | POA: Diagnosis not present

## 2014-10-18 DIAGNOSIS — E1122 Type 2 diabetes mellitus with diabetic chronic kidney disease: Secondary | ICD-10-CM | POA: Diagnosis not present

## 2014-10-18 DIAGNOSIS — N184 Chronic kidney disease, stage 4 (severe): Secondary | ICD-10-CM

## 2014-10-18 DIAGNOSIS — Z79899 Other long term (current) drug therapy: Secondary | ICD-10-CM | POA: Diagnosis not present

## 2014-10-18 DIAGNOSIS — N189 Chronic kidney disease, unspecified: Secondary | ICD-10-CM | POA: Insufficient documentation

## 2014-10-18 DIAGNOSIS — I2510 Atherosclerotic heart disease of native coronary artery without angina pectoris: Secondary | ICD-10-CM | POA: Diagnosis not present

## 2014-10-18 DIAGNOSIS — Z7902 Long term (current) use of antithrombotics/antiplatelets: Secondary | ICD-10-CM | POA: Diagnosis not present

## 2014-10-18 DIAGNOSIS — E785 Hyperlipidemia, unspecified: Secondary | ICD-10-CM | POA: Insufficient documentation

## 2014-10-18 DIAGNOSIS — I340 Nonrheumatic mitral (valve) insufficiency: Secondary | ICD-10-CM

## 2014-10-18 DIAGNOSIS — I5022 Chronic systolic (congestive) heart failure: Secondary | ICD-10-CM | POA: Diagnosis not present

## 2014-10-18 DIAGNOSIS — I129 Hypertensive chronic kidney disease with stage 1 through stage 4 chronic kidney disease, or unspecified chronic kidney disease: Secondary | ICD-10-CM | POA: Diagnosis not present

## 2014-10-18 DIAGNOSIS — I480 Paroxysmal atrial fibrillation: Secondary | ICD-10-CM | POA: Insufficient documentation

## 2014-10-18 DIAGNOSIS — I255 Ischemic cardiomyopathy: Secondary | ICD-10-CM | POA: Diagnosis not present

## 2014-10-18 LAB — BASIC METABOLIC PANEL
Anion gap: 12 (ref 5–15)
BUN: 60 mg/dL — ABNORMAL HIGH (ref 6–20)
CO2: 33 mmol/L — AB (ref 22–32)
Calcium: 8.8 mg/dL — ABNORMAL LOW (ref 8.9–10.3)
Chloride: 95 mmol/L — ABNORMAL LOW (ref 101–111)
Creatinine, Ser: 2.77 mg/dL — ABNORMAL HIGH (ref 0.44–1.00)
GFR calc Af Amer: 16 mL/min — ABNORMAL LOW (ref >60)
GFR calc non Af Amer: 14 mL/min — ABNORMAL LOW (ref >60)
GLUCOSE: 100 mg/dL — AB (ref 65–99)
POTASSIUM: 3.5 mmol/L (ref 3.5–5.1)
SODIUM: 140 mmol/L (ref 135–145)

## 2014-10-18 LAB — CBC
HEMATOCRIT: 37.7 % (ref 36.0–46.0)
Hemoglobin: 12.6 g/dL (ref 12.0–15.0)
MCH: 31 pg (ref 26.0–34.0)
MCHC: 33.4 g/dL (ref 30.0–36.0)
MCV: 92.9 fL (ref 78.0–100.0)
Platelets: 229 10*3/uL (ref 150–400)
RBC: 4.06 MIL/uL (ref 3.87–5.11)
RDW: 14 % (ref 11.5–15.5)
WBC: 7.8 10*3/uL (ref 4.0–10.5)

## 2014-10-18 NOTE — Patient Instructions (Signed)
Labs today  Your physician recommends that you schedule a follow-up appointment in: 2 months  Do the following things EVERYDAY: 1) Weigh yourself in the morning before breakfast. Write it down and keep it in a log. 2) Take your medicines as prescribed 3) Eat low salt foods-Limit salt (sodium) to 2000 mg per day.  4) Stay as active as you can everyday 5) Limit all fluids for the day to less than 2 liters 6)   

## 2014-10-18 NOTE — Progress Notes (Signed)
Patient ID: Leah Kramer, female   DOB: [birth year], 81 y.o.   MRN: [id number] PCP: Dr Herma Ard   HPI: 81 year old female with history of heart failure, chronic kidney disease, hypertension, diabetes, CAD.  Per Care Everywhere she presented to [facility] in October with late STEMI. Treated medically. EF 20% and severe MR. Has been struggling with volume management in setting on chronic renal failure.   TTE 03/2014 [facility]): Severe LV dysfunction (EF 20%) with basal inferior, basal anterospetal, and basal anterior hypokinesis, dilated LV, diastolic dysfunction, elevated LV filling pressures, degen MV with severe MR, dilated LA, moderate AR, severe pulmonary hypertension, normal RV function, mild TR, elevated CVP  Admitted 12/4 with respiratory failure due to recurrent HF.  Required short tem milrionone and diuresed with IV lasix.She transitioned to torsemide.  She had short episode of A fib and converted spontaneously. Discharge weight was 117 pounds.   She returns for follow up. Weight at home 107-111 pounds. Takes metolazone about once a week.  She adjusts torsemide according to her weight.  She is not short of breath walking around her house.  She did ok walking in here today.  She is out of breath with moderately exertional housework.  She had one episode of chest pain while lying in bed last night => resolved with NTG.  No other chest pain episodes recently.  No exertional chest pain. No melena or BRBPR.  She is in NSR today.   ECHO 09/07/2014 EF 20% Grade II DD Mod Severe MR.   ROS: All systems negative except as listed in HPI, PMH and Problem List.  Past Medical History  Diagnosis Date  . CKD (chronic kidney disease)   . CHF (congestive heart failure)   . STEMI (ST elevation myocardial infarction)   . Hypertension   . Diabetes mellitus without complication   . Difficulty walking   . Overactive bladder   . Hyperlipidemia     Current Outpatient Prescriptions  Medication Sig Dispense  Refill  . apixaban (ELIQUIS) 2.5 MG TABS tablet Take 1 tablet (2.5 mg total) by mouth 2 (two) times daily. 60 tablet 2  . calcium carbonate (TUMS - DOSED IN MG ELEMENTAL CALCIUM) 500 MG chewable tablet Chew 1 tablet by mouth 3 (three) times daily as needed for indigestion or heartburn.    . calcium-vitamin D (OSCAL) 250-125 MG-UNIT per tablet Take 1 tablet by mouth daily.    . carvedilol (COREG) 3.125 MG tablet Take 1 tablet (3.125 mg total) by mouth 2 (two) times daily. 180 tablet 3  . cholecalciferol (VITAMIN D) 400 UNITS TABS tablet Take 400 Units by mouth daily.    . cyanocobalamin (,VITAMIN B-12,) 1000 MCG/ML injection Inject 1,000 mcg into the muscle every 30 (thirty) days.    . hydrALAZINE (APRESOLINE) 25 MG tablet Take 0.5 tablets (12.5 mg total) by mouth every 8 (eight) hours. 180 tablet 0  . isosorbide mononitrate (IMDUR) 30 MG 24 hr tablet Take 1 tablet (30 mg total) by mouth daily. 30 tablet 3  . metolazone (ZAROXOLYN) 2.5 MG tablet Take 1 tablet (2.5 mg total) by mouth once a week. Thursday.  May take 1 extra tablet for weight gain, edema, and/or shortness of breath. 10 tablet 3  . nitroGLYCERIN (NITROSTAT) 0.4 MG SL tablet Place 0.4 mg under the tongue every 5 (five) minutes as needed for chest pain.    Marland Kitchen omeprazole (PRILOSEC) 20 MG capsule Take 20 mg by mouth 2 (two) times daily.    Marland Kitchen oxybutynin (DITROPAN-XL) 10  MG 24 hr tablet Take 1 tablet (10 mg total) by mouth every morning. 30 tablet 0  . polyethylene glycol (MIRALAX / GLYCOLAX) packet Take 17 g by mouth daily.    . potassium chloride SA (K-DUR,KLOR-CON) 20 MEQ tablet Take 1 tablet (20 mEq total) by mouth as needed. Take when you take Metolazone 30 tablet 3  . simvastatin (ZOCOR) 20 MG tablet Take 1 tablet (20 mg total) by mouth daily. 30 tablet 2  . torsemide (DEMADEX) 20 MG tablet Take 2 tablets (40 mg total) by mouth daily. Hold if wt is <107 lb 60 tablet 3  . vitamin E 400 UNIT capsule Take 400 Units by mouth daily.     No  current facility-administered medications for this encounter.     PHYSICAL EXAM: Filed Vitals:   10/18/14 1443  BP: 96/52  Pulse: 64  Weight: 110 lb (49.896 kg)  SpO2: 94%    General:  Chronically ill appearing. No resp difficulty.  HEENT: normal Neck: supple. JVP 8-9 cm. Carotids 2+ bilaterally; no bruits. No lymphadenopathy or thryomegaly appreciated. Cor: PMI normal. Regular rate & rhythm. No rubs, gallops.  2/6 early SEM RUSB.  1+ PT pulses bilaterally.  Lungs: Slight crackles at bases bilaterally.  Abdomen: soft, nontender, distended. No hepatosplenomegaly. No bruits or masses. Good bowel sounds. Extremities: no cyanosis, clubbing, rash, R and LLE trace edema.  Neuro: alert & orientedx3, cranial nerves grossly intact. Moves all 4 extremities w/o difficulty. Affect pleasant.  ASSESSMENT & PLAN:  1. Chronic Systolic Heart Failure: Ischemic cardiomyopathy, EF 20%.  She is not a candidate for advanced therapies due to her age.  She has NYHA class III symptoms with mild volume overload on exam.  She has maintained a stable weight with a sliding scale of torsemide and metolazone.  - Continue current diuretic plan.  - BMET today.  - Continue hydralazine/Imdur and Coreg.  - No ACEI/ARB/spironolactone with CKD.  2. CAD: S/P STEMI at [facility] in 10/15.  Late-presenting, no intervention.  One episode of fairly atypical chest pain yesterday, no exertional chest pain.  On apixaban 2.5 mg twice a day so no aspirin.  Continue statin.  3. Paroxysmal atrial fibrillation: NSR today. Continue apixaban at reduced dose with age, weight, and creatinine. Continue low dose carvedilol.  CBC today.   Followup in 2 months.   Loralie Champagne 10/18/2014

## 2014-10-28 ENCOUNTER — Other Ambulatory Visit (HOSPITAL_COMMUNITY): Payer: Self-pay | Admitting: Internal Medicine

## 2014-11-06 ENCOUNTER — Other Ambulatory Visit (HOSPITAL_COMMUNITY): Payer: Self-pay

## 2014-11-29 ENCOUNTER — Encounter: Payer: Self-pay | Admitting: Podiatry

## 2014-11-29 ENCOUNTER — Ambulatory Visit (INDEPENDENT_AMBULATORY_CARE_PROVIDER_SITE_OTHER): Payer: Medicare Other | Admitting: Podiatry

## 2014-11-29 ENCOUNTER — Ambulatory Visit (INDEPENDENT_AMBULATORY_CARE_PROVIDER_SITE_OTHER): Payer: Medicare Other

## 2014-11-29 VITALS — BP 104/58 | HR 63 | Resp 16

## 2014-11-29 DIAGNOSIS — I2510 Atherosclerotic heart disease of native coronary artery without angina pectoris: Secondary | ICD-10-CM

## 2014-11-29 DIAGNOSIS — M79673 Pain in unspecified foot: Secondary | ICD-10-CM

## 2014-11-29 DIAGNOSIS — I739 Peripheral vascular disease, unspecified: Secondary | ICD-10-CM

## 2014-11-29 DIAGNOSIS — M19079 Primary osteoarthritis, unspecified ankle and foot: Secondary | ICD-10-CM | POA: Diagnosis not present

## 2014-11-29 DIAGNOSIS — E119 Type 2 diabetes mellitus without complications: Secondary | ICD-10-CM

## 2014-11-29 DIAGNOSIS — Q828 Other specified congenital malformations of skin: Secondary | ICD-10-CM

## 2014-11-29 DIAGNOSIS — B351 Tinea unguium: Secondary | ICD-10-CM

## 2014-11-29 DIAGNOSIS — M2040 Other hammer toe(s) (acquired), unspecified foot: Secondary | ICD-10-CM

## 2014-11-29 LAB — HM DIABETES FOOT EXAM

## 2014-11-29 MED ORDER — DICLOFENAC SODIUM 1 % TD GEL: 4.0000 g | Freq: Four times a day (QID) | TRANSDERMAL | Status: DC

## 2014-11-29 NOTE — Progress Notes (Signed)
voltaren gel approved

## 2014-11-29 NOTE — Progress Notes (Signed)
Prior authorizion is needed for voltaren gel. Authorization has been started

## 2014-11-29 NOTE — Progress Notes (Signed)
   Subjective:    Patient ID: Leah Kramer, female    DOB: 1923-07-28, 81 y.o.   MRN: [id number] My feet hurt..  Pt comes in today with her feet hurting. She is diabetic, her last A1c was May 6.8.  Pt would like today to get her nails trimmed , if it does not hurt, she has corns in between her toes and a bunion on the right foot medial side .  Her feet have been bothering her for sometime, toes mostly , her skin is red in appearance . She has seen her GP about her feet and he was concerned about blood flow and he told her to soak her feet in "drift" .  HPI    Review of Systems  Constitutional: Positive for appetite change.  HENT: Positive for hearing loss.        Hearing loss  Eyes: Positive for itching.       Dry eyes  Respiratory: Positive for shortness of breath.   Cardiovascular: Positive for chest pain.  Gastrointestinal:       Bloating   Endocrine:       Urgency        Objective:   Physical Exam: I have reviewed her past medical history medications allergies surgery social history and review of systems. Pulses are palpable dorsalis pedis bilateral. Nonpalpable PTs bilateral. Capillary fill time is immediate. Skin is dry and tight overlying the dorsum of the foot. Neurological evaluation demonstrates deep tendon reflexes are brisk and equal bilateral slightly decreased sensorium per Semmes-Weinstein monofilament. Orthopedic evaluation demonstrates hallux valgus disorder and rigid hammertoe deformities left greater than right. Cutaneous evaluation to demonstrates dry tight skin overlying deformities distal clavus fourth toe left foot. Painful elongated nails 1 through 5 bilateral. Which are thick yellow dystrophic and clinically mycotic.        Assessment & Plan:  Assessment: Diabetes with diabetic peripheral neuropathy and diabetic angiopathy. Pain in limb secondary to onychomycosis and porokeratosis.  Plan: Debrided all reactive hyperkeratoses and nails bilaterally. Wrote a  prescription for diclofenac gel to be applied 2-4 times a day.

## 2014-12-05 ENCOUNTER — Ambulatory Visit: Payer: Medicare Other | Admitting: Podiatry

## 2014-12-19 ENCOUNTER — Ambulatory Visit (HOSPITAL_COMMUNITY)
Admission: RE | Admit: 2014-12-19 | Discharge: 2014-12-19 | Disposition: A | Discharge status: Home / Self Care | Payer: Medicare Other | Source: Ambulatory Visit | Attending: Internal Medicine | Admitting: Internal Medicine

## 2014-12-19 ENCOUNTER — Encounter (HOSPITAL_COMMUNITY): Payer: Self-pay

## 2014-12-19 VITALS — BP 92/52 | HR 71 | Wt 108.5 lb

## 2014-12-19 DIAGNOSIS — I129 Hypertensive chronic kidney disease with stage 1 through stage 4 chronic kidney disease, or unspecified chronic kidney disease: Secondary | ICD-10-CM | POA: Insufficient documentation

## 2014-12-19 DIAGNOSIS — I5020 Unspecified systolic (congestive) heart failure: Secondary | ICD-10-CM

## 2014-12-19 DIAGNOSIS — I255 Ischemic cardiomyopathy: Secondary | ICD-10-CM | POA: Diagnosis not present

## 2014-12-19 DIAGNOSIS — I2510 Atherosclerotic heart disease of native coronary artery without angina pectoris: Secondary | ICD-10-CM

## 2014-12-19 DIAGNOSIS — Z79899 Other long term (current) drug therapy: Secondary | ICD-10-CM | POA: Diagnosis not present

## 2014-12-19 DIAGNOSIS — E119 Type 2 diabetes mellitus without complications: Secondary | ICD-10-CM | POA: Diagnosis not present

## 2014-12-19 DIAGNOSIS — N189 Chronic kidney disease, unspecified: Secondary | ICD-10-CM | POA: Diagnosis not present

## 2014-12-19 DIAGNOSIS — Z7901 Long term (current) use of anticoagulants: Secondary | ICD-10-CM | POA: Insufficient documentation

## 2014-12-19 DIAGNOSIS — I2583 Coronary atherosclerosis due to lipid rich plaque: Secondary | ICD-10-CM

## 2014-12-19 DIAGNOSIS — I5022 Chronic systolic (congestive) heart failure: Secondary | ICD-10-CM | POA: Insufficient documentation

## 2014-12-19 DIAGNOSIS — E785 Hyperlipidemia, unspecified: Secondary | ICD-10-CM | POA: Diagnosis not present

## 2014-12-19 DIAGNOSIS — I480 Paroxysmal atrial fibrillation: Secondary | ICD-10-CM | POA: Diagnosis not present

## 2014-12-19 DIAGNOSIS — I252 Old myocardial infarction: Secondary | ICD-10-CM | POA: Insufficient documentation

## 2014-12-19 LAB — BASIC METABOLIC PANEL
ANION GAP: 8 (ref 5–15)
BUN: 45 mg/dL — ABNORMAL HIGH (ref 6–20)
CALCIUM: 9.5 mg/dL (ref 8.9–10.3)
CHLORIDE: 99 mmol/L — AB (ref 101–111)
CO2: 32 mmol/L (ref 22–32)
Creatinine, Ser: 2.3 mg/dL — ABNORMAL HIGH (ref 0.44–1.00)
GFR calc Af Amer: 21 mL/min — ABNORMAL LOW (ref >60)
GFR calc non Af Amer: 18 mL/min — ABNORMAL LOW (ref >60)
GLUCOSE: 121 mg/dL — AB (ref 65–99)
POTASSIUM: 3.8 mmol/L (ref 3.5–5.1)
Sodium: 139 mmol/L (ref 135–145)

## 2014-12-19 NOTE — Progress Notes (Signed)
Patient ID: Leah Kramer, female   DOB: 08-Sep-1923, 81 y.o.   MRN: [id number] PCP: Dr Herma Ard   HPI: 81 year old female with history of heart failure, chronic kidney disease, hypertension, diabetes, CAD.  Per Care Everywhere she presented to [facility] in October with late STEMI. Treated medically. EF 20% and severe MR. Has been struggling with volume management in setting on chronic renal failure.   TTE 03/2014 [facility]): Severe LV dysfunction (EF 20%) with basal inferior, basal anterospetal, and basal anterior hypokinesis, dilated LV, diastolic dysfunction, elevated LV filling pressures, degen MV with severe MR, dilated LA, moderate AR, severe pulmonary hypertension, normal RV function, mild TR, elevated CVP  Admitted 12/4 with respiratory failure due to recurrent HF.  Required short tem milrionone and diuresed with IV lasix.She transitioned to torsemide.  She had short episode of A fib and converted spontaneously. Discharge weight was 117 pounds.   She returns for follow up. Overall feeling ok. Weight at home 106-111 pounds. She continues to take metolazone once a week. Denies CP. Mild dyspnea with exertion.  Taking all medications. Has been able to travel to the beach.    Labs 5/16 K 3.5, Cr 2.77  ECHO 09/07/2014 EF 20% Grade II DD Mod Severe MR.   ROS: All systems negative except as listed in HPI, PMH and Problem List.  Past Medical History  Diagnosis Date  . CKD (chronic kidney disease)   . CHF (congestive heart failure)   . STEMI (ST elevation myocardial infarction)   . Hypertension   . Diabetes mellitus without complication   . Difficulty walking   . Overactive bladder   . Hyperlipidemia     Current Outpatient Prescriptions  Medication Sig Dispense Refill  . apixaban (ELIQUIS) 2.5 MG TABS tablet Take 1 tablet (2.5 mg total) by mouth 2 (two) times daily. 60 tablet 2  . calcium carbonate (TUMS - DOSED IN MG ELEMENTAL CALCIUM) 500 MG chewable tablet Chew 1 tablet by mouth 3  (three) times daily as needed for indigestion or heartburn.    . calcium-vitamin D (OSCAL) 250-125 MG-UNIT per tablet Take 1 tablet by mouth daily.    . carvedilol (COREG) 3.125 MG tablet Take 1 tablet (3.125 mg total) by mouth 2 (two) times daily. 180 tablet 3  . cholecalciferol (VITAMIN D) 400 UNITS TABS tablet Take 400 Units by mouth daily.    . cyanocobalamin (,VITAMIN B-12,) 1000 MCG/ML injection Inject 1,000 mcg into the muscle every 30 (thirty) days.    . diclofenac sodium (VOLTAREN) 1 % GEL Apply 4 g topically 4 (four) times daily. 100 g 4  . hydrALAZINE (APRESOLINE) 25 MG tablet Take 0.5 tablets (12.5 mg total) by mouth every 8 (eight) hours. 180 tablet 0  . isosorbide mononitrate (IMDUR) 30 MG 24 hr tablet Take 1 tablet (30 mg total) by mouth daily. 30 tablet 3  . nitroGLYCERIN (NITROSTAT) 0.4 MG SL tablet Place 0.4 mg under the tongue every 5 (five) minutes as needed for chest pain.    Marland Kitchen omeprazole (PRILOSEC) 20 MG capsule Take 20 mg by mouth 2 (two) times daily.    Marland Kitchen oxybutynin (DITROPAN-XL) 10 MG 24 hr tablet Take 1 tablet (10 mg total) by mouth every morning. 30 tablet 0  . polyethylene glycol (MIRALAX / GLYCOLAX) packet Take 17 g by mouth daily.    . potassium chloride SA (K-DUR,KLOR-CON) 20 MEQ tablet Take 1 tablet (20 mEq total) by mouth as needed. Take when you take Metolazone 30 tablet 3  . simvastatin (  ZOCOR) 20 MG tablet Take 1 tablet (20 mg total) by mouth daily. 30 tablet 2  . torsemide (DEMADEX) 20 MG tablet TAKE TWO TABLETS BY MOUTH ONCE DAILY 60 tablet 3  . vitamin E 400 UNIT capsule Take 400 Units by mouth daily.     No current facility-administered medications for this encounter.     PHYSICAL EXAM: Filed Vitals:   12/19/14 1413  BP: 92/52  Pulse: 71  Weight: 108 lb 8 oz (49.215 kg)  SpO2: 95%    General:  Chronically ill appearing. No resp difficulty.  HEENT: normal Neck: supple. JVP 8-9 cm. Carotids 2+ bilaterally; no bruits. No lymphadenopathy or  thryomegaly appreciated. Cor: PMI normal. Regular rate & rhythm. No rubs, gallops.  2/6 early SEM RUSB.  PT pulses bilaterally.  Lungs: Slight crackles at bases bilaterally.  Abdomen: soft, nontender, distended. No hepatosplenomegaly. No bruits or masses. Good bowel sounds. Extremities: no cyanosis, clubbing, rash, R and LLE trace edema.  Neuro: alert & orientedx3, cranial nerves grossly intact. Moves all 4 extremities w/o difficulty. Affect pleasant.  ASSESSMENT & PLAN:  1. Chronic Systolic Heart Failure: Ischemic cardiomyopathy, EF 20% ECHO 4/16.  She is not a candidate for advanced therapies due to her age.  She has NYHA class III symptoms  Volume status stable.  Continue current diuretic plan.  - Continue hydralazine/Imdur and Coreg.  - No ACEI/ARB/spironolactone with CKD.  Check BMET  2. CAD: S/P STEMI at [facility] in 10/15.  Late-presenting, no intervention.  One episode of fairly atypical chest pain in May, no exertional chest pain.   - On apixaban 2.5 mg twice a day so no aspirin.  Continue statin.  3. Paroxysmal atrial fibrillation: Regular rhythm. . Continue apixaban at reduced dose with age, weight, and creatinine. Continue low dose carvedilol.    Labs today. Follow up in 3 months.   CLEGG,[name] NP-C  12/19/2014

## 2014-12-19 NOTE — Patient Instructions (Signed)
Routine lab work today. Will notify you of abnormal results, otherwise no news is good news!  Follow up 3 months.  Do the following things EVERYDAY: 1) Weigh yourself in the morning before breakfast. Write it down and keep it in a log. 2) Take your medicines as prescribed 3) Eat low salt foods-Limit salt (sodium) to 2000 mg per day.  4) Stay as active as you can everyday 5) Limit all fluids for the day to less than 2 liters

## 2015-02-06 ENCOUNTER — Other Ambulatory Visit (HOSPITAL_COMMUNITY): Payer: Self-pay | Admitting: *Deleted

## 2015-02-06 ENCOUNTER — Encounter (HOSPITAL_COMMUNITY): Payer: Self-pay

## 2015-02-06 MED ORDER — APIXABAN 2.5 MG PO TABS: 2.5000 mg | ORAL_TABLET | Freq: Two times a day (BID) | ORAL | Status: DC

## 2015-02-21 ENCOUNTER — Ambulatory Visit (INDEPENDENT_AMBULATORY_CARE_PROVIDER_SITE_OTHER): Payer: Medicare Other | Admitting: Podiatry

## 2015-02-21 ENCOUNTER — Encounter: Payer: Self-pay | Admitting: Podiatry

## 2015-02-21 DIAGNOSIS — L89891 Pressure ulcer of other site, stage 1: Secondary | ICD-10-CM

## 2015-02-21 DIAGNOSIS — M79676 Pain in unspecified toe(s): Secondary | ICD-10-CM | POA: Diagnosis not present

## 2015-02-21 DIAGNOSIS — B351 Tinea unguium: Secondary | ICD-10-CM

## 2015-02-21 DIAGNOSIS — L97519 Non-pressure chronic ulcer of other part of right foot with unspecified severity: Secondary | ICD-10-CM

## 2015-02-21 DIAGNOSIS — Q828 Other specified congenital malformations of skin: Secondary | ICD-10-CM

## 2015-02-21 DIAGNOSIS — E11621 Type 2 diabetes mellitus with foot ulcer: Secondary | ICD-10-CM

## 2015-02-21 MED ORDER — MUPIROCIN 2 % EX OINT: TOPICAL_OINTMENT | CUTANEOUS | Status: DC

## 2015-02-21 NOTE — Progress Notes (Signed)
She presents today with a chief complaint of painful elongated toenails as well as a superficial sore to the right foot overlying the fifth metatarsal base as she points to this area. She states that this is quite sore particularly with shoe gear or anything anything touches it.  Objective: Vital signs are stable she is alert and oriented 3 she has a history of diabetic peripheral neuropathy as well as diabetic peripheral angiopathy. Superficial ulceration overlying the fifth metatarsal base of the right foot measures approximately 3 mm to 4 mm in diameter and does not demonstrate any probing to bone. The superficial wound does not demonstrate any purulence or any signs of infection. Fibrin deposition is present with a mild area of erythema surrounding the wound but does not appear to be infectious. Her toenails are thick yellow dystrophic onychomycotic and painful palpation with hammertoe deformities.  Assessment: Diabetic peripheral angiopathy with diabetic peripheral neuropathy and diabetic ulceration right foot. Pain in limb secondary to onychomycosis.  Plan: Prescription for Bactroban ointment to be applied to the wound after soaking in Epsom salts and warm water. We debrided nails 1 through 5 bilateral. I demonstrated to the patient Haldol with a dressing wrapped daily she understands and is amenable to it will follow-up with me in a couple of weeks just for reevaluation of this

## 2015-02-27 ENCOUNTER — Other Ambulatory Visit (HOSPITAL_COMMUNITY): Payer: Self-pay | Admitting: *Deleted

## 2015-02-27 MED ORDER — TORSEMIDE 20 MG PO TABS: 40.0000 mg | ORAL_TABLET | Freq: Every day | ORAL | Status: DC

## 2015-03-05 ENCOUNTER — Ambulatory Visit: Payer: Medicare Other | Admitting: Podiatry

## 2015-03-05 ENCOUNTER — Other Ambulatory Visit (HOSPITAL_COMMUNITY): Payer: Self-pay | Admitting: *Deleted

## 2015-03-05 MED ORDER — NITROGLYCERIN 0.4 MG SL SUBL: 0.4000 mg | SUBLINGUAL_TABLET | SUBLINGUAL | Status: AC | PRN

## 2015-03-07 ENCOUNTER — Encounter: Payer: Self-pay | Admitting: Podiatry

## 2015-03-07 ENCOUNTER — Ambulatory Visit: Payer: Medicare Other | Admitting: Podiatry

## 2015-03-07 ENCOUNTER — Ambulatory Visit (INDEPENDENT_AMBULATORY_CARE_PROVIDER_SITE_OTHER): Payer: Medicare Other | Admitting: Podiatry

## 2015-03-07 VITALS — BP 104/54 | HR 57 | Resp 18

## 2015-03-07 DIAGNOSIS — I2510 Atherosclerotic heart disease of native coronary artery without angina pectoris: Secondary | ICD-10-CM

## 2015-03-07 DIAGNOSIS — L97519 Non-pressure chronic ulcer of other part of right foot with unspecified severity: Secondary | ICD-10-CM

## 2015-03-07 DIAGNOSIS — E11621 Type 2 diabetes mellitus with foot ulcer: Secondary | ICD-10-CM | POA: Diagnosis not present

## 2015-03-07 NOTE — Progress Notes (Signed)
She presents today for follow-up of an ulceration vascular nature to the lateral aspect of the fifth metatarsal base right foot. He states that it seems to be doing better and everyone tells me that it is getting better.  Objective: Vital signs are stable she is alert and oriented 3 ulceration appears to be much smaller measuring only 4 mm in diameter at this point. No granulation tissue is present there is fibrous deposition present. I see no signs of infection.  Assessment: Ulceration overlying fifth metatarsal base right foot secondary to peripheral vascular disease.  Plan: Discussed etiology pathology conservative versus surgical therapies. Continue all conservative therapies placed padding with a aperture pad today and a dressing. Follow up with her in 4 weeks.

## 2015-03-15 ENCOUNTER — Encounter (HOSPITAL_COMMUNITY): Payer: Self-pay

## 2015-03-15 ENCOUNTER — Ambulatory Visit (HOSPITAL_COMMUNITY)
Admission: RE | Admit: 2015-03-15 | Discharge: 2015-03-15 | Disposition: A | Discharge status: Home / Self Care | Payer: Medicare Other | Source: Ambulatory Visit | Attending: Cardiology | Admitting: Cardiology

## 2015-03-15 VITALS — BP 102/58 | HR 56 | Wt 112.0 lb

## 2015-03-15 DIAGNOSIS — I480 Paroxysmal atrial fibrillation: Secondary | ICD-10-CM | POA: Diagnosis not present

## 2015-03-15 DIAGNOSIS — E785 Hyperlipidemia, unspecified: Secondary | ICD-10-CM | POA: Insufficient documentation

## 2015-03-15 DIAGNOSIS — E1122 Type 2 diabetes mellitus with diabetic chronic kidney disease: Secondary | ICD-10-CM | POA: Insufficient documentation

## 2015-03-15 DIAGNOSIS — Z7902 Long term (current) use of antithrombotics/antiplatelets: Secondary | ICD-10-CM | POA: Insufficient documentation

## 2015-03-15 DIAGNOSIS — I129 Hypertensive chronic kidney disease with stage 1 through stage 4 chronic kidney disease, or unspecified chronic kidney disease: Secondary | ICD-10-CM | POA: Diagnosis not present

## 2015-03-15 DIAGNOSIS — Z79899 Other long term (current) drug therapy: Secondary | ICD-10-CM | POA: Insufficient documentation

## 2015-03-15 DIAGNOSIS — I5022 Chronic systolic (congestive) heart failure: Secondary | ICD-10-CM | POA: Diagnosis present

## 2015-03-15 DIAGNOSIS — I255 Ischemic cardiomyopathy: Secondary | ICD-10-CM | POA: Diagnosis not present

## 2015-03-15 DIAGNOSIS — I252 Old myocardial infarction: Secondary | ICD-10-CM | POA: Diagnosis not present

## 2015-03-15 DIAGNOSIS — N189 Chronic kidney disease, unspecified: Secondary | ICD-10-CM | POA: Insufficient documentation

## 2015-03-15 DIAGNOSIS — I2583 Coronary atherosclerosis due to lipid rich plaque: Secondary | ICD-10-CM

## 2015-03-15 DIAGNOSIS — I2510 Atherosclerotic heart disease of native coronary artery without angina pectoris: Secondary | ICD-10-CM | POA: Diagnosis not present

## 2015-03-15 LAB — BASIC METABOLIC PANEL
Anion gap: 11 (ref 5–15)
BUN: 67 mg/dL — AB (ref 6–20)
CALCIUM: 8.7 mg/dL — AB (ref 8.9–10.3)
CHLORIDE: 102 mmol/L (ref 101–111)
CO2: 29 mmol/L (ref 22–32)
CREATININE: 2.42 mg/dL — AB (ref 0.44–1.00)
GFR calc non Af Amer: 17 mL/min — ABNORMAL LOW (ref >60)
GFR, EST AFRICAN AMERICAN: 19 mL/min — AB (ref >60)
GLUCOSE: 101 mg/dL — AB (ref 65–99)
Potassium: 3.7 mmol/L (ref 3.5–5.1)
Sodium: 142 mmol/L (ref 135–145)

## 2015-03-15 MED ORDER — METOLAZONE 2.5 MG PO TABS: 2.5000 mg | ORAL_TABLET | ORAL | Status: DC | PRN

## 2015-03-15 NOTE — Progress Notes (Signed)
Patient ID: Leah Kramer, female   DOB: [birth year], 81 y.o.   MRN: [id number] PCP: Dr Herma Ard   HPI: 81 year old female with history of heart failure, chronic kidney disease, hypertension, diabetes, CAD.  Per Care Everywhere she presented to [facility] in October with late STEMI. Treated medically. EF 20% and severe MR. Has been struggling with volume management in setting on chronic renal failure.   TTE 03/2014 [facility]): Severe LV dysfunction (EF 20%) with basal inferior, basal anterospetal, and basal anterior hypokinesis, dilated LV, diastolic dysfunction, elevated LV filling pressures, degen MV with severe MR, dilated LA, moderate AR, severe pulmonary hypertension, normal RV function, mild TR, elevated CVP  Admitted 12/4 with respiratory failure due to recurrent HF.  Required short tem milrionone and diuresed with IV lasix.She transitioned to torsemide.  She had short episode of A fib and converted spontaneously. Discharge weight was 117 pounds.   She returns for follow up. Overall feeling ok. Weight at home 105-109 pounds. Has not had metolazone in 2 weeks. Does have increased dyspnea with exertion. Increased leg edema.Denies CP.  Taking all medications. Has been able to travel to the beach.    Labs 5/16 K 3.5, Cr 2.77 Labs 12/19/2014: K 3.8 Creatinine 2.42   ECHO 09/07/2014 EF 20% Grade II DD Mod Severe MR.   ROS: All systems negative except as listed in HPI, PMH and Problem List.  Past Medical History  Diagnosis Date  . CKD (chronic kidney disease)   . CHF (congestive heart failure) ([place])   . STEMI (ST elevation myocardial infarction) ([place])   . Hypertension   . Diabetes mellitus without complication ([place])   . Difficulty walking   . Overactive bladder   . Hyperlipidemia     Current Outpatient Prescriptions  Medication Sig Dispense Refill  . apixaban (ELIQUIS) 2.5 MG TABS tablet Take 1 tablet (2.5 mg total) by mouth 2 (two) times daily. 60 tablet 2  . calcium carbonate (TUMS -  DOSED IN MG ELEMENTAL CALCIUM) 500 MG chewable tablet Chew 1 tablet by mouth 3 (three) times daily as needed for indigestion or heartburn.    . calcium-vitamin D (OSCAL) 250-125 MG-UNIT per tablet Take 1 tablet by mouth daily.    . carvedilol (COREG) 3.125 MG tablet Take 1 tablet (3.125 mg total) by mouth 2 (two) times daily. 180 tablet 3  . cholecalciferol (VITAMIN D) 400 UNITS TABS tablet Take 400 Units by mouth daily.    . cyanocobalamin (,VITAMIN B-12,) 1000 MCG/ML injection Inject 1,000 mcg into the muscle every 30 (thirty) days.    . diclofenac sodium (VOLTAREN) 1 % GEL Apply 4 g topically 4 (four) times daily. 100 g 4  . hydrALAZINE (APRESOLINE) 25 MG tablet Take 0.5 tablets (12.5 mg total) by mouth every 8 (eight) hours. 180 tablet 0  . isosorbide mononitrate (IMDUR) 30 MG 24 hr tablet Take 1 tablet (30 mg total) by mouth daily. 30 tablet 3  . mupirocin ointment (BACTROBAN) 2 % Apply to wound twice a day. 30 g 2  . omeprazole (PRILOSEC) 20 MG capsule Take 20 mg by mouth 2 (two) times daily.    Marland Kitchen oxybutynin (DITROPAN-XL) 10 MG 24 hr tablet Take 1 tablet (10 mg total) by mouth every morning. 30 tablet 0  . polyethylene glycol (MIRALAX / GLYCOLAX) packet Take 17 g by mouth daily.    . simvastatin (ZOCOR) 20 MG tablet Take 1 tablet (20 mg total) by mouth daily. 30 tablet 2  . torsemide (DEMADEX) 20 MG tablet  Take 2 tablets (40 mg total) by mouth daily. 60 tablet 3  . vitamin E 400 UNIT capsule Take 400 Units by mouth daily.    . metolazone (ZAROXOLYN) 2.5 MG tablet Take 1 tablet (2.5 mg total) by mouth as needed (for weight gain).    . nitroGLYCERIN (NITROSTAT) 0.4 MG SL tablet Place 1 tablet (0.4 mg total) under the tongue every 5 (five) minutes as needed for chest pain. (Patient not taking: Reported on 03/15/2015) 25 tablet 2  . potassium chloride SA (K-DUR,KLOR-CON) 20 MEQ tablet Take 1 tablet (20 mEq total) by mouth as needed. Take when you take Metolazone (Patient not taking: Reported on  03/15/2015) 30 tablet 3   No current facility-administered medications for this encounter.     PHYSICAL EXAM: Filed Vitals:   03/15/15 1000  BP: 102/58  Pulse: 56  Weight: 112 lb (50.803 kg)  SpO2: 97%    General:  Chronically ill appearing. No resp difficulty.  HEENT: normal Neck: supple. JVP to jaw. 8-9 cm. Carotids 2+ bilaterally; no bruits. No lymphadenopathy or thryomegaly appreciated. Cor: PMI normal. Regular rate & rhythm. No rubs, gallops.  2/6 early SEM RUSB.  PT pulses bilaterally.  Lungs: Clear decreased in the bases. .  Abdomen: soft, nontender, distended. No hepatosplenomegaly. No bruits or masses. Good bowel sounds. Extremities: no cyanosis, clubbing, rash, R and LLE trace edema.  Neuro: alert & orientedx3, cranial nerves grossly intact. Moves all 4 extremities w/o difficulty. Affect pleasant.  ASSESSMENT & PLAN:  1. Chronic Systolic Heart Failure: Ischemic cardiomyopathy, EF 20% ECHO 4/16.  She is not a candidate for advanced therapies due to her age.  She has NYHA class III symptoms Has narrow euvolemic window. Home weight goal ~105 pounds.  Volume status elevated. Continue torsemide 40 mg daily and I have asked her to take metolazone 2.5 mg today. If weight does not come down she will take another 2.5 mg metolazone  - Continue hydralazine/Imdur and Coreg.  - No ACEI/ARB/spironolactone with CKD.  Check BMET today 2. CAD: S/P STEMI at [facility] in 10/15.  Late-presenting, no intervention.  One episode of fairly atypical chest pain in May, no exertional chest pain.   - On apixaban 2.5 mg twice a day so no aspirin.  Continue statin.  3. Paroxysmal atrial fibrillation: Regular rhythm. Continue apixaban at reduced dose with age, weight, and creatinine. No bleeding problems. Continue low dose carvedilol.    Check BMET today.  Follow up in 6 weeks.    [name] Clegg NP-C  03/15/2015

## 2015-03-15 NOTE — Patient Instructions (Signed)
LAB DRAWN TODAY: BMET   TAKE METALOZONE ONE 2.5 MG TABLET TODAY  RETURN IN 6 WEEKS FOR FOLLOW UP WITH [name] CLEGG NP

## 2015-03-16 ENCOUNTER — Telehealth (HOSPITAL_COMMUNITY): Payer: Self-pay

## 2015-03-16 NOTE — Telephone Encounter (Signed)
Daughter called.  Concerned that mother was up a lb this morning even taking dose of metolazone 2.5 mg yesterday.  Up several times last night urinating  Took an extra dose of metolazone this morning as directed by [name] Clegg yesterday.  Is taking her torsemide.  Daughter wants to know what they need to do if weight is sill up tomorrow morning.  Told daughter to make sure she keeps her sodium intake down, elevate feet and if still having weight gain tomorrow to call the on-call doctor for the practice by calling our office number

## 2015-03-17 ENCOUNTER — Telehealth: Payer: Self-pay | Admitting: Physician Assistant

## 2015-03-17 NOTE — Telephone Encounter (Signed)
Patient's daughter contacted the after hour cardiology service. States patient has been having leg pain. Seen by HF service on Thur, given weight gain, she has been instructed to take 2.5mg  metolazone. She took additional KCl along with metolazone.   Per daughter weight this morning is 108, goal for dry weight is 105. Her daughter states she is not particularly feeling more SOB than normal. Her leg pain maybe caused by many thing include MSK, known ulcers on the leg vs hypokalemia induced. Given weight above baseline, instructed to give another metolazone and KCl, but monitor leg pain for now.  Hilbert Corrigan PA Pager: [phone]

## 2015-03-20 ENCOUNTER — Telehealth (HOSPITAL_COMMUNITY): Payer: Self-pay

## 2015-03-20 DIAGNOSIS — I509 Heart failure, unspecified: Secondary | ICD-10-CM

## 2015-03-20 NOTE — Telephone Encounter (Signed)
Daughter said her mother would like a referral to [facility] in [place].  Fax orders to: [phone]

## 2015-03-20 NOTE — Telephone Encounter (Signed)
   Called regarding increased leg edema. Says she took a metolazone but legs still edematous with serous exudate.   Referred to home health. Recommending compressions wraps. Until home health starts will need to elevate legs.   Leah Kramer verbalized understanding.   Eliab Closson NP-C

## 2015-03-20 NOTE — Addendum Note (Signed)
Addended by: JEFFRIES, CHANTEL M on: 03/20/2015 05:00 PM   Modules accepted: Orders

## 2015-03-20 NOTE — Telephone Encounter (Signed)
Daughter is going to talk with her mom to see if she is in agreement to have home health see her for leg swelling and weeping especially right leg  Daughter said [name] was considering increasing her torsemide.

## 2015-03-21 NOTE — Telephone Encounter (Signed)
[facility] in [place] to make sure that they had eveything they needed  Confirmed information is there and will be in touch with family soon. They are waiting on insurance to approve.  Spoke with daughter to let her know that its in problem.

## 2015-03-23 ENCOUNTER — Telehealth (HOSPITAL_COMMUNITY): Payer: Self-pay

## 2015-03-23 NOTE — Telephone Encounter (Signed)
RN from [facility] called. Said patients right lower leg is weeping.  She has three open areas.  No signs or symptoms of infection.  Used Kerlix to absorb drainage Recommends an ointment or dressing that has silver in it to assist or a drying agent such as calcium aglinate  Routing to Darrick Grinder NP

## 2015-03-30 NOTE — Telephone Encounter (Signed)
  RN from [facility] called. Said patients right lower leg is weeping. She has three open areas. No signs or symptoms of infection. Used Kerlix to absorb drainage Recommends an ointment or dressing that has silver in it to assist or a drying agent such as calcium aglinate

## 2015-03-31 NOTE — Telephone Encounter (Signed)
Sounds fine with me but would get NP Clegg's opinion.

## 2015-04-01 NOTE — Telephone Encounter (Signed)
Please call [facility] agency ask them to add calcium aginate + ace for mild compression. Change 2-3 times per week.   [name] Clegg 2:28 PM

## 2015-04-02 NOTE — Telephone Encounter (Signed)
Order for [facility] to add calcium aginate to dressing and  ace for mild compression and change 2-3 times per week. Faxed to [facility] in [place]

## 2015-04-03 ENCOUNTER — Other Ambulatory Visit: Payer: Self-pay | Admitting: Internal Medicine

## 2015-04-04 ENCOUNTER — Ambulatory Visit (INDEPENDENT_AMBULATORY_CARE_PROVIDER_SITE_OTHER): Payer: Medicare Other | Admitting: Podiatry

## 2015-04-04 ENCOUNTER — Encounter: Payer: Self-pay | Admitting: Podiatry

## 2015-04-04 DIAGNOSIS — R234 Changes in skin texture: Secondary | ICD-10-CM | POA: Diagnosis not present

## 2015-04-04 DIAGNOSIS — L97519 Non-pressure chronic ulcer of other part of right foot with unspecified severity: Secondary | ICD-10-CM

## 2015-04-04 DIAGNOSIS — I2510 Atherosclerotic heart disease of native coronary artery without angina pectoris: Secondary | ICD-10-CM

## 2015-04-04 DIAGNOSIS — E11621 Type 2 diabetes mellitus with foot ulcer: Secondary | ICD-10-CM

## 2015-04-04 DIAGNOSIS — M79673 Pain in unspecified foot: Secondary | ICD-10-CM

## 2015-04-05 ENCOUNTER — Telehealth (HOSPITAL_COMMUNITY): Payer: Self-pay

## 2015-04-05 NOTE — Telephone Encounter (Signed)
Great thanks for the update

## 2015-04-05 NOTE — Telephone Encounter (Signed)
Cathy from [facility] called to give a report on weeping of patients legs:  No more weeping Healing scabs lower leg with any signs symptoms of infection Stopped Calcium Aginate agent; no longer needed Now just wrapping legs Continue to monitor and plan on DC end of next week

## 2015-04-05 NOTE — Progress Notes (Signed)
She presents today for follow-up of an ulceration fifth metatarsal base of the right foot. She states that she continues to soak the foot daily Epsom salts and warm water and has seen no major change in the ulceration as of yet. She also has venous stasis ulcers on the legs and they're being taken care of by wound care.  Objective: Vital signs are stable she is alert and oriented 3. The wound appears the same there is no erythema cellulitis strange odor fibrin deposition is present some epithelialization is currently around the margins but only measures approximately 4 mm in diameter. She does have dry cracked skin fissures to the plantar aspect of the bilateral feet also associated with soaking in Epsom salts.  Assessment: Ulceration fifth met base right non-complicated skin fissures plantar aspect right foot.  Plan: At this point I recommended discontinuing the soaking to at least every other day or possibly once or twice a week. I encouraged her to continue her application of Mr. Dianah Field ointment and offloading with bandages. I will follow-up with her in 1 month. She will call sooner if needed or if changes are evident. I also recommended a topical cream for the fissures.  Roselind Messier DPM

## 2015-04-10 ENCOUNTER — Telehealth (HOSPITAL_COMMUNITY): Payer: Self-pay

## 2015-04-10 NOTE — Telephone Encounter (Signed)
Weight has increased from 110 lbs last week to 113 lbs over the weekend and had metolazone on Saturday and Sunday. Legs have marked increased edema from last week with swelling beyond knee right leg. Lungs are clear, no increased shortness of breath Drainage(weeping) on lets looks good  I called patient she reports her weight and medication as follows:  Base weight 110 Weight Thursday 111 Weight Friday       112 Weight Saturday   112  Took metolazone  Weight Monday     11 2 Weight Tuesday    113  Took metolazone  Call [facility] [phone] and told her that Darrick Grinder gave a verbal order that she can have a dose of metolazone today but the patient told me she already took this morning.  [facility] will monitor fluid in legs this week.  Is due for DC from their services on Monday for the leg weeping but may need to evaluate the need for further care with her CHF issues

## 2015-04-13 NOTE — Telephone Encounter (Signed)
[facility] RN left VM that she has extended their length of service with patient. NO symptoms of chf at this time. Pt is scheduled for an office visit Tuesday.  Pt weight 114 for 2 days.

## 2015-04-17 ENCOUNTER — Telehealth (HOSPITAL_COMMUNITY): Payer: Self-pay

## 2015-04-17 ENCOUNTER — Ambulatory Visit (HOSPITAL_COMMUNITY)
Admission: RE | Admit: 2015-04-17 | Discharge: 2015-04-17 | Disposition: A | Discharge status: Home / Self Care | Payer: Medicare Other | Source: Ambulatory Visit | Attending: Internal Medicine | Admitting: Internal Medicine

## 2015-04-17 VITALS — BP 98/56 | HR 58 | Wt 116.2 lb

## 2015-04-17 DIAGNOSIS — E1122 Type 2 diabetes mellitus with diabetic chronic kidney disease: Secondary | ICD-10-CM | POA: Diagnosis not present

## 2015-04-17 DIAGNOSIS — L03115 Cellulitis of right lower limb: Secondary | ICD-10-CM | POA: Diagnosis not present

## 2015-04-17 DIAGNOSIS — I252 Old myocardial infarction: Secondary | ICD-10-CM | POA: Insufficient documentation

## 2015-04-17 DIAGNOSIS — Z7902 Long term (current) use of antithrombotics/antiplatelets: Secondary | ICD-10-CM | POA: Insufficient documentation

## 2015-04-17 DIAGNOSIS — I255 Ischemic cardiomyopathy: Secondary | ICD-10-CM | POA: Diagnosis not present

## 2015-04-17 DIAGNOSIS — I5022 Chronic systolic (congestive) heart failure: Secondary | ICD-10-CM | POA: Diagnosis present

## 2015-04-17 DIAGNOSIS — I2510 Atherosclerotic heart disease of native coronary artery without angina pectoris: Secondary | ICD-10-CM | POA: Insufficient documentation

## 2015-04-17 DIAGNOSIS — E785 Hyperlipidemia, unspecified: Secondary | ICD-10-CM | POA: Diagnosis not present

## 2015-04-17 DIAGNOSIS — N184 Chronic kidney disease, stage 4 (severe): Secondary | ICD-10-CM

## 2015-04-17 DIAGNOSIS — I480 Paroxysmal atrial fibrillation: Secondary | ICD-10-CM

## 2015-04-17 DIAGNOSIS — N189 Chronic kidney disease, unspecified: Secondary | ICD-10-CM | POA: Insufficient documentation

## 2015-04-17 DIAGNOSIS — I130 Hypertensive heart and chronic kidney disease with heart failure and stage 1 through stage 4 chronic kidney disease, or unspecified chronic kidney disease: Secondary | ICD-10-CM | POA: Insufficient documentation

## 2015-04-17 DIAGNOSIS — Z79899 Other long term (current) drug therapy: Secondary | ICD-10-CM | POA: Insufficient documentation

## 2015-04-17 MED ORDER — TORSEMIDE 20 MG PO TABS: 60.0000 mg | ORAL_TABLET | Freq: Every day | ORAL | Status: DC

## 2015-04-17 MED ORDER — POTASSIUM CHLORIDE CRYS ER 20 MEQ PO TBCR: 20.0000 meq | EXTENDED_RELEASE_TABLET | Freq: Every day | ORAL | Status: DC

## 2015-04-17 MED ORDER — APIXABAN 2.5 MG PO TABS: 2.5000 mg | ORAL_TABLET | Freq: Two times a day (BID) | ORAL | Status: DC

## 2015-04-17 MED ORDER — METOLAZONE 2.5 MG PO TABS: 2.5000 mg | ORAL_TABLET | ORAL | Status: DC | PRN

## 2015-04-17 MED ORDER — POTASSIUM CHLORIDE CRYS ER 20 MEQ PO TBCR: 20.0000 meq | EXTENDED_RELEASE_TABLET | Freq: Every day | ORAL | Status: AC

## 2015-04-17 MED ORDER — DOXYCYCLINE HYCLATE 100 MG PO TBEC: 100.0000 mg | DELAYED_RELEASE_TABLET | Freq: Two times a day (BID) | ORAL | Status: DC

## 2015-04-17 MED ORDER — HYDRALAZINE HCL 25 MG PO TABS: 12.5000 mg | ORAL_TABLET | Freq: Three times a day (TID) | ORAL | Status: DC

## 2015-04-17 MED ORDER — ISOSORBIDE MONONITRATE ER 30 MG PO TB24: 30.0000 mg | ORAL_TABLET | Freq: Every day | ORAL | Status: DC

## 2015-04-17 MED ORDER — APIXABAN 2.5 MG PO TABS: 2.5000 mg | ORAL_TABLET | Freq: Two times a day (BID) | ORAL | Status: AC

## 2015-04-17 MED ORDER — CARVEDILOL 3.125 MG PO TABS: 3.1250 mg | ORAL_TABLET | Freq: Two times a day (BID) | ORAL | Status: DC

## 2015-04-17 NOTE — Telephone Encounter (Signed)
Returned call to [facility]. Clarification with [name] on Doxycycline.  Generic regular release

## 2015-04-17 NOTE — Patient Instructions (Signed)
TAKE Doxycycline 100mg  twice a day for 7 days.  INCREASE Torsemide to 60mg  daily. (3 tablets)  TAKE Potassium 77meq daily... Take an additional 24meq when you take metolazone.  FOLLOW UP : 1 week with [name] CLEGG  Do the following things EVERYDAY: 1) Weigh yourself in the morning before breakfast. Write it down and keep it in a log. 2) Take your medicines as prescribed 3) Eat low salt foods-Limit salt (sodium) to 2000 mg per day.  4) Stay as active as you can everyday 5) Limit all fluids for the day to less than 2 liters

## 2015-04-17 NOTE — Progress Notes (Signed)
Patient ID: Leah Kramer, female   DOB: [birth year], 81 y.o.   MRN: [id number] PCP: Dr Herma Ard   HPI: 81 year old female with history of heart failure, chronic kidney disease, hypertension, diabetes, CAD.  Per Care Everywhere she presented to [facility] in October with late STEMI. Treated medically. EF 20% and severe MR. Has been struggling with volume management in setting on chronic renal failure.   TTE 03/2014 [facility]): Severe LV dysfunction (EF 20%) with basal inferior, basal anterospetal, and basal anterior hypokinesis, dilated LV, diastolic dysfunction, elevated LV filling pressures, degen MV with severe MR, dilated LA, moderate AR, severe pulmonary hypertension, normal RV function, mild TR, elevated CVP  Admitted 12/4 with respiratory failure due to recurrent HF.  Required short tem milrionone and diuresed with IV lasix.She transitioned to torsemide.  She had short episode of A fib and converted spontaneously. Discharge weight was 117 pounds.   She returns for follow up. Overall feeling poorly. RLE red with increased exudate. Weight at home home trending up to 115 pounds. Taking metolazone a couple times a week.  Does have increased dyspnea with exertion. Increased leg edema and serous exudate.  Denies CP. Taking all medications. [facility] following.   Labs 5/16 K 3.5, Cr 2.77 Labs 12/19/2014: K 3.8 Creatinine 2.30  Labs 03/15/2015: K 3.7 Creatinine 2.42  ECHO 09/07/2014 EF 20% Grade II DD Mod Severe MR.   ROS: All systems negative except as listed in HPI, PMH and Problem List.  Past Medical History  Diagnosis Date  . CKD (chronic kidney disease)   . CHF (congestive heart failure) ([place])   . STEMI (ST elevation myocardial infarction) ([place])   . Hypertension   . Diabetes mellitus without complication ([place])   . Difficulty walking   . Overactive bladder   . Hyperlipidemia     Current Outpatient Prescriptions  Medication Sig Dispense Refill  . acetaminophen (TYLENOL) 325 MG  tablet Take 650 mg by mouth every 6 (six) hours as needed for mild pain.    Marland Kitchen apixaban (ELIQUIS) 2.5 MG TABS tablet Take 1 tablet (2.5 mg total) by mouth 2 (two) times daily. 60 tablet 2  . carvedilol (COREG) 3.125 MG tablet TAKE ONE TABLET BY MOUTH TWICE DAILY 180 tablet 3  . cholecalciferol (VITAMIN D) 400 UNITS TABS tablet Take 400 Units by mouth daily.    . cyanocobalamin (,VITAMIN B-12,) 1000 MCG/ML injection Inject 1,000 mcg into the muscle every 30 (thirty) days. Takes on the 20th of every month    . diclofenac sodium (VOLTAREN) 1 % GEL Apply 4 g topically 4 (four) times daily. 100 g 4  . hydrALAZINE (APRESOLINE) 25 MG tablet Take 0.5 tablets (12.5 mg total) by mouth every 8 (eight) hours. 180 tablet 0  . isosorbide mononitrate (IMDUR) 30 MG 24 hr tablet Take 1 tablet (30 mg total) by mouth daily. 30 tablet 3  . metolazone (ZAROXOLYN) 2.5 MG tablet Take 1 tablet (2.5 mg total) by mouth as needed (for weight gain). 8 tablet 3  . mupirocin ointment (BACTROBAN) 2 % Apply to wound twice a day. 30 g 2  . nitroGLYCERIN (NITROSTAT) 0.4 MG SL tablet Place 1 tablet (0.4 mg total) under the tongue every 5 (five) minutes as needed for chest pain. 25 tablet 2  . omeprazole (PRILOSEC) 20 MG capsule Take 20 mg by mouth daily.     Marland Kitchen oxybutynin (DITROPAN-XL) 10 MG 24 hr tablet Take 1 tablet (10 mg total) by mouth every morning. 30 tablet 0  .  polyethylene glycol (MIRALAX / GLYCOLAX) packet Take 17 g by mouth daily as needed for mild constipation.     . potassium chloride SA (K-DUR,KLOR-CON) 20 MEQ tablet Take 1 tablet (20 mEq total) by mouth as needed. Take when you take Metolazone 30 tablet 3  . simvastatin (ZOCOR) 20 MG tablet Take 1 tablet (20 mg total) by mouth daily. 30 tablet 2  . torsemide (DEMADEX) 20 MG tablet Take 2 tablets (40 mg total) by mouth daily. 60 tablet 3  . vitamin E 400 UNIT capsule Take 400 Units by mouth daily.    . calcium carbonate (TUMS - DOSED IN MG ELEMENTAL CALCIUM) 500 MG  chewable tablet Chew 1 tablet by mouth 3 (three) times daily as needed for indigestion or heartburn.     No current facility-administered medications for this encounter.     PHYSICAL EXAM: Filed Vitals:   04/17/15 1019  BP: 98/56  Pulse: 58  Weight: 116 lb 3.2 oz (52.708 kg)  SpO2: 97%    General:  Chronically ill appearing. No resp difficulty. Arrived in a wheel chair.  HEENT: normal Neck: supple. JVP to jaw.. Carotids 2+ bilaterally; no bruits. No lymphadenopathy or thryomegaly appreciated. Cor: PMI normal. Regular rate & rhythm. No rubs, gallops.  2/6 early SEM RUSB.  PT pulses bilaterally.  Lungs: Clear decreased in the bases. .  Abdomen: soft, nontender, distended. No hepatosplenomegaly. No bruits or masses. Good bowel sounds. Extremities: no cyanosis, clubbing, rash, RLE erythema with mod serous exudate. R and LLE 2+ edema.   Neuro: alert & orientedx3, cranial nerves grossly intact. Moves all 4 extremities w/o difficulty. Affect pleasant.  ASSESSMENT & PLAN:  1. Chronic Systolic Heart Failure: Ischemic cardiomyopathy, EF 20% ECHO 4/16.  She is not a candidate for advanced therapies due to her age.  She has NYHA class III symptoms Has narrow euvolemic window. Home weight goal ~105 pounds. Weight at home up to 115 pounds.  Volume status elevated. Increase torsemide to  60 mg daily and she will take 2.5 mg metolazone today.  Increase potassium to 20 meq daily + extra 20 meq K on metolazone days. Continue metolazone as needed.  Continue hydralazine/Imdur and Coreg.  - No ACEI/ARB/spironolactone with CKD.  Check BMET next week.  2. CAD: S/P STEMI at [facility] in 10/15.  Late-presenting, no intervention.  One episode of fairly atypical chest pain in May, no exertional chest pain.   - On apixaban 2.5 mg twice a day so no aspirin.  Continue statin.  3. Paroxysmal atrial fibrillation: Regular rhythm. Continue apixaban at reduced dose with age, weight, and creatinine. No bleeding problems.  Continue low dose carvedilol.  4. RLE cellulitis- Start doxycyline 100 mg twice daily. Continue conservative wound care RLE with mild compression.      Follow up next week to reassess volume and legs.    [name] Clegg NP-C  04/17/2015

## 2015-04-17 NOTE — Progress Notes (Signed)
Advanced Heart Failure Medication Review by a Pharmacist  Does the patient  feel that his/her medications are working for him/her?  yes  Has the patient been experiencing any side effects to the medications prescribed?  no  Does the patient measure his/her own blood pressure or blood glucose at home?  yes   Does the patient have any problems obtaining medications due to transportation or finances?   yes  Understanding of regimen: good Understanding of indications: good Potential of compliance: good Patient understands to avoid NSAIDs. Patient understands to avoid decongestants.  Issues to address at subsequent visits: Eliquis Patient Assistance application status   Pharmacist comments:  Leah Kramer is a pleasant 81 yo F presenting with her husband and daughter. She seems to have a good understanding of her regimen including dosages and reports excellent compliance. She did state that she is now in the donut hole and is concerned about how she will afford her Eliquis. I have provided her with a patient assistance application. She did not have any other medication-related questions or concerns for me at this time.  Leah Kramer. Leah Kramer, PharmD, BCPS, CPP Clinical Pharmacist Pager: [phone] Phone: [phone] 04/17/2015 10:23 AM      Time with patient: 8 minutes Preparation and documentation time: 10 minutes Total time: 18 minutes

## 2015-04-24 ENCOUNTER — Ambulatory Visit (HOSPITAL_COMMUNITY)
Admission: RE | Admit: 2015-04-24 | Discharge: 2015-04-24 | Disposition: A | Discharge status: Home / Self Care | Payer: Medicare Other | Source: Ambulatory Visit | Attending: Internal Medicine | Admitting: Internal Medicine

## 2015-04-24 VITALS — BP 84/60 | HR 58 | Wt 114.0 lb

## 2015-04-24 DIAGNOSIS — I272 Other secondary pulmonary hypertension: Secondary | ICD-10-CM | POA: Diagnosis not present

## 2015-04-24 DIAGNOSIS — I129 Hypertensive chronic kidney disease with stage 1 through stage 4 chronic kidney disease, or unspecified chronic kidney disease: Secondary | ICD-10-CM | POA: Insufficient documentation

## 2015-04-24 DIAGNOSIS — I5022 Chronic systolic (congestive) heart failure: Secondary | ICD-10-CM

## 2015-04-24 DIAGNOSIS — N184 Chronic kidney disease, stage 4 (severe): Secondary | ICD-10-CM

## 2015-04-24 DIAGNOSIS — R0789 Other chest pain: Secondary | ICD-10-CM | POA: Insufficient documentation

## 2015-04-24 DIAGNOSIS — Z7901 Long term (current) use of anticoagulants: Secondary | ICD-10-CM | POA: Diagnosis not present

## 2015-04-24 DIAGNOSIS — E1122 Type 2 diabetes mellitus with diabetic chronic kidney disease: Secondary | ICD-10-CM | POA: Insufficient documentation

## 2015-04-24 DIAGNOSIS — I480 Paroxysmal atrial fibrillation: Secondary | ICD-10-CM

## 2015-04-24 DIAGNOSIS — I2510 Atherosclerotic heart disease of native coronary artery without angina pectoris: Secondary | ICD-10-CM | POA: Diagnosis not present

## 2015-04-24 DIAGNOSIS — I509 Heart failure, unspecified: Secondary | ICD-10-CM | POA: Diagnosis not present

## 2015-04-24 DIAGNOSIS — L03115 Cellulitis of right lower limb: Secondary | ICD-10-CM | POA: Diagnosis not present

## 2015-04-24 DIAGNOSIS — Z79899 Other long term (current) drug therapy: Secondary | ICD-10-CM | POA: Insufficient documentation

## 2015-04-24 DIAGNOSIS — R262 Difficulty in walking, not elsewhere classified: Secondary | ICD-10-CM | POA: Insufficient documentation

## 2015-04-24 DIAGNOSIS — E785 Hyperlipidemia, unspecified: Secondary | ICD-10-CM | POA: Insufficient documentation

## 2015-04-24 DIAGNOSIS — I2583 Coronary atherosclerosis due to lipid rich plaque: Secondary | ICD-10-CM

## 2015-04-24 DIAGNOSIS — I252 Old myocardial infarction: Secondary | ICD-10-CM | POA: Diagnosis not present

## 2015-04-24 LAB — BASIC METABOLIC PANEL
ANION GAP: 12 (ref 5–15)
BUN: 116 mg/dL — ABNORMAL HIGH (ref 6–20)
CO2: 30 mmol/L (ref 22–32)
Calcium: 8.6 mg/dL — ABNORMAL LOW (ref 8.9–10.3)
Chloride: 97 mmol/L — ABNORMAL LOW (ref 101–111)
Creatinine, Ser: 2.98 mg/dL — ABNORMAL HIGH (ref 0.44–1.00)
GFR calc non Af Amer: 13 mL/min — ABNORMAL LOW (ref >60)
GFR, EST AFRICAN AMERICAN: 15 mL/min — AB (ref >60)
GLUCOSE: 118 mg/dL — AB (ref 65–99)
POTASSIUM: 3.6 mmol/L (ref 3.5–5.1)
Sodium: 139 mmol/L (ref 135–145)

## 2015-04-24 MED ORDER — METOLAZONE 2.5 MG PO TABS: ORAL_TABLET | ORAL | Status: DC

## 2015-04-24 MED ORDER — TORSEMIDE 20 MG PO TABS: 40.0000 mg | ORAL_TABLET | Freq: Two times a day (BID) | ORAL | Status: DC

## 2015-04-24 NOTE — Progress Notes (Signed)
Patient ID: Jamariya Gastineau, female   DOB: [birth year], 81 y.o.   MRN: [id number]  PCP: Dr Herma Ard   HPI: 81 year old female with history of heart failure, chronic kidney disease, hypertension, diabetes, CAD.  Per Care Everywhere she presented to [facility] in October with late STEMI. Treated medically. EF 20% and severe MR. Has been struggling with volume management in setting on chronic renal failure.   TTE 03/2014 [facility]): Severe LV dysfunction (EF 20%) with basal inferior, basal anterospetal, and basal anterior hypokinesis, dilated LV, diastolic dysfunction, elevated LV filling pressures, degen MV with severe MR, dilated LA, moderate AR, severe pulmonary hypertension, normal RV function, mild TR, elevated CVP  Admitted 12/4 with respiratory failure due to recurrent HF.  Required short tem milrionone and diuresed with IV lasix.She transitioned to torsemide.  She had short episode of A fib and converted spontaneously. Discharge weight was 117 pounds.   She returns for follow up. Last visit torsemide was increased to 60 mg daily and metolazone was added weekly. Also started on doxycycline for cellulitis.  Does have increased dyspnea with exertion. Increased leg edema and serous exudate.  Denies CP. Taking all medications. [place] following.   Labs 5/16 K 3.5, Cr 2.77 Labs 12/19/2014: K 3.8 Creatinine 2.30  Labs 03/15/2015: K 3.7 Creatinine 2.42  ECHO 09/07/2014 EF 20% Grade II DD Mod Severe MR.   ROS: All systems negative except as listed in HPI, PMH and Problem List.  Past Medical History  Diagnosis Date  . CKD (chronic kidney disease)   . CHF (congestive heart failure) ([place])   . STEMI (ST elevation myocardial infarction) ([place])   . Hypertension   . Diabetes mellitus without complication ([place])   . Difficulty walking   . Overactive bladder   . Hyperlipidemia     Current Outpatient Prescriptions  Medication Sig Dispense Refill  . acetaminophen (TYLENOL) 325 MG tablet Take 650  mg by mouth every 6 (six) hours as needed for mild pain.    Marland Kitchen apixaban (ELIQUIS) 2.5 MG TABS tablet Take 1 tablet (2.5 mg total) by mouth 2 (two) times daily. 180 tablet 3  . calcium carbonate (TUMS - DOSED IN MG ELEMENTAL CALCIUM) 500 MG chewable tablet Chew 1 tablet by mouth 3 (three) times daily as needed for indigestion or heartburn.    . carvedilol (COREG) 3.125 MG tablet Take 1 tablet (3.125 mg total) by mouth 2 (two) times daily. 180 tablet 3  . cholecalciferol (VITAMIN D) 400 UNITS TABS tablet Take 400 Units by mouth daily.    . cyanocobalamin (,VITAMIN B-12,) 1000 MCG/ML injection Inject 1,000 mcg into the muscle every 30 (thirty) days. Takes on the 20th of every month    . diclofenac sodium (VOLTAREN) 1 % GEL Apply 4 g topically 4 (four) times daily. 100 g 4  . doxycycline (DORYX) 100 MG EC tablet Take 1 tablet (100 mg total) by mouth 2 (two) times daily. For 7 days 14 tablet 0  . hydrALAZINE (APRESOLINE) 25 MG tablet Take 0.5 tablets (12.5 mg total) by mouth every 8 (eight) hours. 180 tablet 0  . isosorbide mononitrate (IMDUR) 30 MG 24 hr tablet Take 1 tablet (30 mg total) by mouth daily. 30 tablet 3  . metolazone (ZAROXOLYN) 2.5 MG tablet Take 1 tablet (2.5 mg total) by mouth as needed (for weight gain). 8 tablet 3  . mupirocin ointment (BACTROBAN) 2 % Apply to wound twice a day. 30 g 2  . nitroGLYCERIN (NITROSTAT) 0.4 MG SL tablet  Place 1 tablet (0.4 mg total) under the tongue every 5 (five) minutes as needed for chest pain. 25 tablet 2  . omeprazole (PRILOSEC) 20 MG capsule Take 20 mg by mouth daily.     Marland Kitchen oxybutynin (DITROPAN-XL) 10 MG 24 hr tablet Take 1 tablet (10 mg total) by mouth every morning. 30 tablet 0  . polyethylene glycol (MIRALAX / GLYCOLAX) packet Take 17 g by mouth daily as needed for mild constipation.     . potassium chloride SA (K-DUR,KLOR-CON) 20 MEQ tablet Take 1 tablet (20 mEq total) by mouth daily. Take additional 61meq when you take Metolazone 45 tablet 3  .  simvastatin (ZOCOR) 20 MG tablet Take 1 tablet (20 mg total) by mouth daily. 30 tablet 2  . torsemide (DEMADEX) 20 MG tablet Take 3 tablets (60 mg total) by mouth daily. 90 tablet 3  . vitamin E 400 UNIT capsule Take 400 Units by mouth daily.     No current facility-administered medications for this encounter.     PHYSICAL EXAM: Filed Vitals:   04/24/15 1206  BP: 84/60  Pulse: 58  Weight: 114 lb (51.71 kg)  SpO2: 98%    General:  Chronically ill appearing. No resp difficulty. Arrived in a wheel chair.  HEENT: normal Neck: supple. JVP to jaw.. Carotids 2+ bilaterally; no bruits. No lymphadenopathy or thryomegaly appreciated. Cor: PMI normal. Regular rate & rhythm. No rubs, gallops.  2/6 early SEM RUSB.  PT pulses bilaterally.  Lungs: Clear decreased in the bases. .  Abdomen: soft, nontender, distended. No hepatosplenomegaly. No bruits or masses. Good bowel sounds. Extremities: no cyanosis, clubbing, rash, RLE and LLE compression wraps. R and L thigh 3+ edema.    Neuro: alert & orientedx3, cranial nerves grossly intact. Moves all 4 extremities w/o difficulty. Affect pleasant.  ASSESSMENT & PLAN:  1. Chronic Systolic Heart Failure: Ischemic cardiomyopathy, EF 20% ECHO 4/16.  She is not a candidate for advanced therapies due to her age.  She has NYHA class III symptoms Has narrow euvolemic window. Home weight goal ~105 pounds.  Volume status remains elevated despite increasing torsemide. Increase torsemide to 40 mg twice daily. Continue 2.5 mg metolazone weekly.   -Continue  potassium to 20 meq daily + extra 20 meq K on metolazone days.  -Stop carvedilol, hydralazine, and imdur with systolic BP in [id number]. Suspect low output. - No ACEI/ARB/spironolactone with CKD.  Check BMET   2. CAD: S/P STEMI at [facility] in 10/15.  Late-presenting, no intervention.  One episode of fairly atypical chest pain in May, no exertional chest pain.   - On apixaban 2.5 mg twice a day so no aspirin.  Continue statin.    3. Paroxysmal atrial fibrillation: Regular rhythm. Continue apixaban at reduced dose with age, weight, and creatinine. No bleeding problems. Stop carvedilol due to hypotension. .  4. RLE cellulitis- Completed antibiotic course. Continue conservative wound care RLE with mild compression.    5. CKD.Stage IV    Follow up next week to reassess to volume status. Offered hospital admit to diurese however she declines. Will need to discuss goals of care.   Tarrell Debes NP-C  04/24/2015

## 2015-04-24 NOTE — Patient Instructions (Signed)
FOLLOW UP: next week with [name] CLEGG  Routine lab work today. (bmet) Will notify you of abnormal results  STOP Carvedilol, Hydralazine, and IMDUR  TAKE Metolazone EVERY Friday.

## 2015-04-24 NOTE — Progress Notes (Signed)
Advanced Heart Failure Medication Review by a Pharmacist  Does the patient  feel that his/her medications are working for him/her?  yes  Has the patient been experiencing any side effects to the medications prescribed?  no  Does the patient measure his/her own blood pressure or blood glucose at home?  yes   Does the patient have any problems obtaining medications due to transportation or finances?   no  Understanding of regimen: good Understanding of indications: good Potential of compliance: good Patient understands to avoid NSAIDs. Patient understands to avoid decongestants.  Issues to address at subsequent visits: Eliquis patient assistance status   Pharmacist comments:  Leah Kramer is a pleasant 81 yo F presenting with her daughter and without a medication list. She seems to have a good understanding of her regimen and reports excellent compliance. Her daughter states that they will mail in her portion of the Eliquis patient assistance application as soon as possible. They did not have any other medication-related questions or concerns for me at this time.  [name]. [name], PharmD, BCPS, CPP Clinical Pharmacist Pager: [phone] Phone: [phone] 04/24/2015 12:12 PM      Time with patient: 8 minutes Preparation and documentation time: 2 minutes Total time: 10 minutes

## 2015-04-30 ENCOUNTER — Telehealth (HOSPITAL_COMMUNITY): Payer: Self-pay | Admitting: *Deleted

## 2015-04-30 NOTE — Telephone Encounter (Signed)
[place] RN called to report that Ms Quella leg is worse.  She took the dressing off the leg the drainage had stopped so the dressing was stuck to the leg.  She said the entire leg was saturated, very red, and painful.  HH RN called the PCP and he said to put Vaseline gauze on the entire leg and wrap it.  She is to be seen tomorrow by [name]. We can address it then.

## 2015-05-01 ENCOUNTER — Inpatient Hospital Stay (HOSPITAL_COMMUNITY): Payer: Medicare Other

## 2015-05-01 ENCOUNTER — Ambulatory Visit (HOSPITAL_BASED_OUTPATIENT_CLINIC_OR_DEPARTMENT_OTHER)
Admission: RE | Admit: 2015-05-01 | Discharge: 2015-05-01 | Disposition: A | Discharge status: Home / Self Care | Payer: Medicare Other | Source: Ambulatory Visit | Attending: Internal Medicine | Admitting: Internal Medicine

## 2015-05-01 ENCOUNTER — Encounter (HOSPITAL_COMMUNITY): Payer: Medicare Other

## 2015-05-01 ENCOUNTER — Encounter (HOSPITAL_COMMUNITY): Payer: Self-pay | Admitting: *Deleted

## 2015-05-01 ENCOUNTER — Inpatient Hospital Stay (HOSPITAL_COMMUNITY)
Admission: AD | Admit: 2015-05-01 | Discharge: 2015-05-11 | DRG: 291 | Disposition: A | Discharge status: Home Health | Payer: Medicare Other | Source: Ambulatory Visit | Attending: Internal Medicine | Admitting: Internal Medicine

## 2015-05-01 VITALS — BP 100/50 | HR 62 | Ht 63.0 in | Wt 108.8 lb

## 2015-05-01 DIAGNOSIS — I701 Atherosclerosis of renal artery: Secondary | ICD-10-CM | POA: Diagnosis present

## 2015-05-01 DIAGNOSIS — L03115 Cellulitis of right lower limb: Secondary | ICD-10-CM

## 2015-05-01 DIAGNOSIS — N19 Unspecified kidney failure: Secondary | ICD-10-CM | POA: Diagnosis not present

## 2015-05-01 DIAGNOSIS — L03119 Cellulitis of unspecified part of limb: Secondary | ICD-10-CM | POA: Diagnosis not present

## 2015-05-01 DIAGNOSIS — I2510 Atherosclerotic heart disease of native coronary artery without angina pectoris: Secondary | ICD-10-CM | POA: Diagnosis present

## 2015-05-01 DIAGNOSIS — Z79899 Other long term (current) drug therapy: Secondary | ICD-10-CM

## 2015-05-01 DIAGNOSIS — Z7901 Long term (current) use of anticoagulants: Secondary | ICD-10-CM

## 2015-05-01 DIAGNOSIS — N179 Acute kidney failure, unspecified: Secondary | ICD-10-CM | POA: Diagnosis present

## 2015-05-01 DIAGNOSIS — I252 Old myocardial infarction: Secondary | ICD-10-CM

## 2015-05-01 DIAGNOSIS — N184 Chronic kidney disease, stage 4 (severe): Secondary | ICD-10-CM | POA: Diagnosis present

## 2015-05-01 DIAGNOSIS — I471 Supraventricular tachycardia: Secondary | ICD-10-CM | POA: Diagnosis not present

## 2015-05-01 DIAGNOSIS — E119 Type 2 diabetes mellitus without complications: Secondary | ICD-10-CM | POA: Diagnosis present

## 2015-05-01 DIAGNOSIS — N178 Other acute kidney failure: Secondary | ICD-10-CM | POA: Diagnosis not present

## 2015-05-01 DIAGNOSIS — I272 Other secondary pulmonary hypertension: Secondary | ICD-10-CM | POA: Diagnosis present

## 2015-05-01 DIAGNOSIS — N189 Chronic kidney disease, unspecified: Secondary | ICD-10-CM

## 2015-05-01 DIAGNOSIS — I480 Paroxysmal atrial fibrillation: Secondary | ICD-10-CM

## 2015-05-01 DIAGNOSIS — I5022 Chronic systolic (congestive) heart failure: Secondary | ICD-10-CM | POA: Diagnosis not present

## 2015-05-01 DIAGNOSIS — I130 Hypertensive heart and chronic kidney disease with heart failure and stage 1 through stage 4 chronic kidney disease, or unspecified chronic kidney disease: Secondary | ICD-10-CM | POA: Diagnosis present

## 2015-05-01 DIAGNOSIS — M1000 Idiopathic gout, unspecified site: Secondary | ICD-10-CM | POA: Diagnosis not present

## 2015-05-01 DIAGNOSIS — I5023 Acute on chronic systolic (congestive) heart failure: Secondary | ICD-10-CM | POA: Diagnosis present

## 2015-05-01 DIAGNOSIS — E785 Hyperlipidemia, unspecified: Secondary | ICD-10-CM | POA: Diagnosis present

## 2015-05-01 DIAGNOSIS — R0600 Dyspnea, unspecified: Secondary | ICD-10-CM

## 2015-05-01 DIAGNOSIS — I959 Hypotension, unspecified: Secondary | ICD-10-CM | POA: Diagnosis present

## 2015-05-01 DIAGNOSIS — I255 Ischemic cardiomyopathy: Secondary | ICD-10-CM | POA: Diagnosis present

## 2015-05-01 DIAGNOSIS — I509 Heart failure, unspecified: Secondary | ICD-10-CM | POA: Diagnosis not present

## 2015-05-01 DIAGNOSIS — L8990 Pressure ulcer of unspecified site, unspecified stage: Secondary | ICD-10-CM | POA: Insufficient documentation

## 2015-05-01 DIAGNOSIS — M109 Gout, unspecified: Secondary | ICD-10-CM | POA: Diagnosis present

## 2015-05-01 DIAGNOSIS — L02419 Cutaneous abscess of limb, unspecified: Secondary | ICD-10-CM | POA: Diagnosis not present

## 2015-05-01 LAB — CBC
HEMATOCRIT: 34.8 % — AB (ref 36.0–46.0)
HEMOGLOBIN: 11.3 g/dL — AB (ref 12.0–15.0)
MCH: 31.1 pg (ref 26.0–34.0)
MCHC: 32.5 g/dL (ref 30.0–36.0)
MCV: 95.9 fL (ref 78.0–100.0)
Platelets: 219 10*3/uL (ref 150–400)
RBC: 3.63 MIL/uL — AB (ref 3.87–5.11)
RDW: 15.1 % (ref 11.5–15.5)
WBC: 7.3 10*3/uL (ref 4.0–10.5)

## 2015-05-01 LAB — BASIC METABOLIC PANEL
ANION GAP: 10 (ref 5–15)
BUN: 104 mg/dL — ABNORMAL HIGH (ref 6–20)
CALCIUM: 8.5 mg/dL — AB (ref 8.9–10.3)
CHLORIDE: 97 mmol/L — AB (ref 101–111)
CO2: 33 mmol/L — AB (ref 22–32)
Creatinine, Ser: 2.25 mg/dL — ABNORMAL HIGH (ref 0.44–1.00)
GFR calc non Af Amer: 18 mL/min — ABNORMAL LOW (ref >60)
GFR, EST AFRICAN AMERICAN: 21 mL/min — AB (ref >60)
GLUCOSE: 120 mg/dL — AB (ref 65–99)
POTASSIUM: 3.2 mmol/L — AB (ref 3.5–5.1)
Sodium: 140 mmol/L (ref 135–145)

## 2015-05-01 LAB — MRSA PCR SCREENING: MRSA BY PCR: NEGATIVE

## 2015-05-01 MED ORDER — APIXABAN 2.5 MG PO TABS
2.5000 mg | ORAL_TABLET | Freq: Two times a day (BID) | ORAL | Status: DC
  Administered 2015-05-01 – 2015-05-11 (×20): 2.5 mg via ORAL
  Filled 2015-05-01 (×20): qty 1

## 2015-05-01 MED ORDER — SODIUM CHLORIDE 0.9 % IJ SOLN
10.0000 mL | Freq: Two times a day (BID) | INTRAMUSCULAR | Status: DC
  Administered 2015-05-01 – 2015-05-06 (×9): 10 mL
  Administered 2015-05-07: 20 mL
  Administered 2015-05-07 – 2015-05-10 (×3): 10 mL

## 2015-05-01 MED ORDER — POTASSIUM CHLORIDE CRYS ER 20 MEQ PO TBCR
20.0000 meq | EXTENDED_RELEASE_TABLET | Freq: Two times a day (BID) | ORAL | Status: DC
  Administered 2015-05-01 – 2015-05-07 (×12): 20 meq via ORAL
  Filled 2015-05-01 (×11): qty 1

## 2015-05-01 MED ORDER — CYANOCOBALAMIN 1000 MCG/ML IJ SOLN: 1000.0000 ug | INTRAMUSCULAR | Status: DC

## 2015-05-01 MED ORDER — SIMVASTATIN 20 MG PO TABS
20.0000 mg | ORAL_TABLET | Freq: Every day | ORAL | Status: DC
  Administered 2015-05-01 – 2015-05-10 (×10): 20 mg via ORAL
  Filled 2015-05-01 (×10): qty 1

## 2015-05-01 MED ORDER — MUPIROCIN CALCIUM 2 % EX CREA
TOPICAL_CREAM | Freq: Every day | CUTANEOUS | Status: DC
  Administered 2015-05-02: 1 via TOPICAL
  Administered 2015-05-03 – 2015-05-11 (×8): via TOPICAL
  Filled 2015-05-01: qty 15

## 2015-05-01 MED ORDER — CALCIUM CARBONATE ANTACID 500 MG PO CHEW: 1.0000 | CHEWABLE_TABLET | Freq: Three times a day (TID) | ORAL | Status: DC | PRN

## 2015-05-01 MED ORDER — ACETAMINOPHEN 325 MG PO TABS
650.0000 mg | ORAL_TABLET | ORAL | Status: DC | PRN
  Administered 2015-05-01: 650 mg via ORAL
  Filled 2015-05-01: qty 2

## 2015-05-01 MED ORDER — PANTOPRAZOLE SODIUM 40 MG PO TBEC
40.0000 mg | DELAYED_RELEASE_TABLET | Freq: Every day | ORAL | Status: DC
  Administered 2015-05-02 – 2015-05-11 (×10): 40 mg via ORAL
  Filled 2015-05-01 (×10): qty 1

## 2015-05-01 MED ORDER — SODIUM CHLORIDE 0.9 % IV SOLN
250.0000 mL | INTRAVENOUS | Status: DC | PRN
  Administered 2015-05-06: 250 mL via INTRAVENOUS
  Administered 2015-05-10: 500 mL via INTRAVENOUS

## 2015-05-01 MED ORDER — ACETAMINOPHEN 325 MG PO TABS: 650.0000 mg | ORAL_TABLET | Freq: Four times a day (QID) | ORAL | Status: DC | PRN

## 2015-05-01 MED ORDER — MILRINONE IN DEXTROSE 20 MG/100ML IV SOLN
0.1250 ug/kg/min | INTRAVENOUS | Status: DC
  Administered 2015-05-01 – 2015-05-03 (×2): 0.25 ug/kg/min via INTRAVENOUS
  Administered 2015-05-04: 0.125 ug/kg/min via INTRAVENOUS
  Filled 2015-05-01 (×5): qty 100

## 2015-05-01 MED ORDER — OXYBUTYNIN CHLORIDE ER 10 MG PO TB24
10.0000 mg | ORAL_TABLET | Freq: Every morning | ORAL | Status: DC
  Administered 2015-05-02 – 2015-05-11 (×10): 10 mg via ORAL
  Filled 2015-05-01 (×10): qty 1

## 2015-05-01 MED ORDER — OXYCODONE HCL 5 MG PO TABS
2.5000 mg | ORAL_TABLET | ORAL | Status: DC | PRN
  Administered 2015-05-01 – 2015-05-09 (×7): 5 mg via ORAL
  Filled 2015-05-01 (×7): qty 1

## 2015-05-01 MED ORDER — VANCOMYCIN HCL 500 MG IV SOLR
500.0000 mg | INTRAVENOUS | Status: DC
  Administered 2015-05-01 – 2015-05-05 (×3): 500 mg via INTRAVENOUS
  Filled 2015-05-01 (×3): qty 500

## 2015-05-01 MED ORDER — CHOLECALCIFEROL 10 MCG (400 UNIT) PO TABS
400.0000 [IU] | ORAL_TABLET | Freq: Every day | ORAL | Status: DC
  Administered 2015-05-02 – 2015-05-11 (×10): 400 [IU] via ORAL
  Filled 2015-05-01 (×10): qty 1

## 2015-05-01 MED ORDER — FUROSEMIDE 10 MG/ML IJ SOLN
80.0000 mg | Freq: Two times a day (BID) | INTRAMUSCULAR | Status: DC
  Administered 2015-05-01 – 2015-05-07 (×12): 80 mg via INTRAVENOUS
  Filled 2015-05-01 (×13): qty 8

## 2015-05-01 MED ORDER — ONDANSETRON HCL 4 MG/2ML IJ SOLN: 4.0000 mg | Freq: Four times a day (QID) | INTRAMUSCULAR | Status: DC | PRN

## 2015-05-01 MED ORDER — SODIUM CHLORIDE 0.9 % IJ SOLN: 10.0000 mL | INTRAMUSCULAR | Status: DC | PRN

## 2015-05-01 MED ORDER — SODIUM CHLORIDE 0.9 % IJ SOLN
3.0000 mL | Freq: Two times a day (BID) | INTRAMUSCULAR | Status: DC
  Administered 2015-05-01 – 2015-05-11 (×11): 3 mL via INTRAVENOUS

## 2015-05-01 MED ORDER — SODIUM CHLORIDE 0.9 % IJ SOLN: 3.0000 mL | INTRAMUSCULAR | Status: DC | PRN

## 2015-05-01 NOTE — H&P (Signed)
Advanced Heart Failure Team History and Physical Note   Primary Physician: Dr Heber [place] Primary Cardiologist: Dr Haroldine Laws   Reason for Admission: Marked Volume Overload and Cellulitis.    HPI:   81 year old female with history of heart failure, chronic kidney disease, hypertension, diabetes, CAD.  She presented to [facility] in October 2015 with late STEMI. Treated medically. EF 20% and severe MR.   TTE 03/2014 [facility]): Severe LV dysfunction (EF 20%) with basal inferior, basal anterospetal, and basal anterior hypokinesis, dilated LV, diastolic dysfunction, elevated LV filling pressures, degen MV with severe MR, dilated LA, moderate AR, severe pulmonary hypertension, normal RV function, mild TR, elevated CVP  Admitted 05/05/14 with respiratory failure due to recurrent HF. Required short tem milrionone and diuresed with IV lasix. She transitioned to torsemide. She had short episode of A fib and converted spontaneously. Discharge weight was 117 pounds.   Last week she was seen in HF clinic. Was volume overloaded and hypotensive. Hydralazine/imdur/carvedilol stopped. Torsemide increased.. Renal function has been getting worse with increased diuretic regimen.   Returned to clinic today for follow-up. Weight down from 115 to 106 pounds. Feels ok. Ongoing dyspnea with minimal exertion. Denies PND/Orthopnea.   Increase exudate from RLE. Completed 7 day course of doxycycline but leg with increased redness. Has been followed by [facility].   ECHO 09/06/2014: EF 20% Grade II DD RV normal.   SH: Lives at home with her husband. Does smoke or drink alcohol.  FH: Mother had HF died in her 31s   Review of Systems: [y] = yes, [ ]  = no   General: Weight gain [ ] ; Weight loss [ ] ; Anorexia [ ] ; Fatigue [ ] ; Fever [ ] ; Chills [ ] ; Weakness [Y]  Cardiac: Chest pain/pressure [ ] ; Resting SOB [ ] ; Exertional SOB [Y ]; Orthopnea [Y ]; Pedal Edema [Y ]; Palpitations [ ] ; Syncope [ ] ; Presyncope [ ] ; Paroxysmal  nocturnal dyspnea[ ]   Pulmonary: Cough [ ] ; Wheezing[ ] ; Hemoptysis[ ] ; Sputum [ ] ; Snoring [ ]   GI: Vomiting[ ] ; Dysphagia[ ] ; Melena[ ] ; Hematochezia [ ] ; Heartburn[ ] ; Abdominal pain [ ] ; Constipation [ ] ; Diarrhea [ ] ; BRBPR [ ]   GU: Hematuria[ ] ; Dysuria [ ] ; Nocturia[ ]   Vascular: Pain in legs with walking [ ] ; Pain in feet with lying flat [ ] ; Non-healing sores [ ] ; Stroke [ ] ; TIA [ ] ; Slurred speech [ ] ;  Neuro: Headaches[ ] ; Vertigo[ ] ; Seizures[ ] ; Paresthesias[ ] ;Blurred vision [ ] ; Diplopia [ ] ; Vision changes [ ]   Ortho/Skin: Arthritis [ ] ; Joint pain [ ] ; Muscle pain [ ] ; Joint swelling [ ] ; Back Pain [ ] ; Rash [Y RLE ]  Psych: Depression[ ] ; Anxiety[ ]   Heme: Bleeding problems [ ] ; Clotting disorders [ ] ; Anemia [ ]   Endocrine: Diabetes [ Y]; Thyroid dysfunction[ ]   Home Medications Prior to Admission medications   Medication Sig Start Date End Date Taking? Authorizing Provider  acetaminophen (TYLENOL) 325 MG tablet Take 650 mg by mouth every 6 (six) hours as needed for mild pain.    Historical Provider, MD  apixaban (ELIQUIS) 2.5 MG TABS tablet Take 1 tablet (2.5 mg total) by mouth 2 (two) times daily. 04/17/15   Jolaine Artist, MD  calcium carbonate (TUMS - DOSED IN MG ELEMENTAL CALCIUM) 500 MG chewable tablet Chew 1 tablet by mouth 3 (three) times daily as needed for indigestion or heartburn.    Historical Provider, MD  cholecalciferol (VITAMIN D) 400 UNITS TABS tablet Take  400 Units by mouth daily.    Historical Provider, MD  cyanocobalamin (,VITAMIN B-12,) 1000 MCG/ML injection Inject 1,000 mcg into the muscle every 30 (thirty) days. Takes on the 20th of every month    Historical Provider, MD  diclofenac sodium (VOLTAREN) 1 % GEL Apply 4 g topically 4 (four) times daily. 11/29/14   Max T Hyatt, DPM  metolazone (ZAROXOLYN) 2.5 MG tablet Take 1 tablet every Friday. 04/24/15   [name] D Ninfa Meeker, NP  mupirocin ointment (BACTROBAN) 2 % Apply to wound twice a day. 02/21/15   Max T  Hyatt, DPM  nitroGLYCERIN (NITROSTAT) 0.4 MG SL tablet Place 1 tablet (0.4 mg total) under the tongue every 5 (five) minutes as needed for chest pain. Patient not taking: Reported on 04/24/2015 03/05/15   Larey Dresser, MD  omeprazole (PRILOSEC) 20 MG capsule Take 20 mg by mouth daily.     Historical Provider, MD  oxybutynin (DITROPAN-XL) 10 MG 24 hr tablet Take 1 tablet (10 mg total) by mouth every morning. 07/03/14   Jolaine Artist, MD  polyethylene glycol (MIRALAX / GLYCOLAX) packet Take 17 g by mouth daily as needed for mild constipation.     Historical Provider, MD  potassium chloride SA (K-DUR,KLOR-CON) 20 MEQ tablet Take 1 tablet (20 mEq total) by mouth daily. Take additional 1meq when you take Metolazone 04/17/15   [name] D Clegg, NP  simvastatin (ZOCOR) 20 MG tablet Take 1 tablet (20 mg total) by mouth daily. 07/03/14   Jolaine Artist, MD  torsemide (DEMADEX) 20 MG tablet Take 2 tablets (40 mg total) by mouth 2 (two) times daily. 04/24/15   [name] D Ninfa Meeker, NP  vitamin E 400 UNIT capsule Take 400 Units by mouth daily.    Historical Provider, MD    Past Medical History: Past Medical History  Diagnosis Date  . CKD (chronic kidney disease)   . CHF (congestive heart failure) ([place])   . STEMI (ST elevation myocardial infarction) ([place])   . Hypertension   . Diabetes mellitus without complication ([place])   . Difficulty walking   . Overactive bladder   . Hyperlipidemia     Past Surgical History: Past Surgical History  Procedure Laterality Date  . Tonsillectomy      Family History: No family history on file.  Social History: Social History   Social History  . Marital Status: Married    Spouse Name: N/A  . Number of Children: N/A  . Years of Education: N/A   Social History Main Topics  . Smoking status: Never Smoker   . Smokeless tobacco: Never Used  . Alcohol Use: No  . Drug Use: No  . Sexual Activity: Not on file   Other Topics Concern  . Not on file   Social History  Narrative    Allergies:  Allergies  Allergen Reactions  . Lipitor [Atorvastatin] Other (See Comments)    Leg cramping  . Sulfa Antibiotics Other (See Comments)    Eye swelling    Objective:    Vital Signs:       There were no vitals filed for this visit.  Physical Exam: General:  Elderly Chronically ill appearing. No resp difficulty HEENT: normal Neck: supple. JVP to jaw . Carotids 2+ bilat; no bruits. No lymphadenopathy or thryomegaly appreciated. Cor: PMI laterally displaced. Regular rate & rhythm. +s3 2/6 MR Lungs: clear Abdomen: soft, nontender, nondistended. No hepatosplenomegaly. No bruits or masses. Good bowel sounds. Extremities: no cyanosis, clubbing, rash, RLE erythmea with moderate serous  exudate. R and LLE with 2-3+ edema.  Neuro: alert & orientedx3, cranial nerves grossly intact. moves all 4 extremities w/o difficulty. Affect pleasant  Telemetry: Sinus Rhythm 60s  Labs: Basic Metabolic Panel:  Recent Labs Lab 05/01/15 1030  NA 140  K 3.2*  CL 97*  CO2 33*  GLUCOSE 120*  BUN 104*  CREATININE 2.25*  CALCIUM 8.5*    Liver Function Tests: No results for input(s): AST, ALT, ALKPHOS, BILITOT, PROT, ALBUMIN in the last 168 hours. No results for input(s): LIPASE, AMYLASE in the last 168 hours. No results for input(s): AMMONIA in the last 168 hours.  CBC: No results for input(s): WBC, NEUTROABS, HGB, HCT, MCV, PLT in the last 168 hours.  Cardiac Enzymes: No results for input(s): CKTOTAL, CKMB, CKMBINDEX, TROPONINI in the last 168 hours.  BNP: BNP (last 3 results)  Recent Labs  06/15/14 1421  BNP 3172.6*    ProBNP (last 3 results)  Recent Labs  05/06/14 0840  PROBNP 41228.0*     CBG: No results for input(s): GLUCAP in the last 168 hours.  Coagulation Studies: No results for input(s): LABPROT, INR in the last 72 hours.  Other results: EKG: NSR 89 bpm  Imaging:  No results found.      Assessment/Plan   1. Acute/Chronic  Systolic Heart Failure: Ischemic cardiomyopathy, EF 20% ECHO 4/16. She is not a candidate for advanced therapies due to her age.  She has NYHA class III symptoms with ongoing volume overload. She has progressive worsening of renal function. Will need to start milrinone. Volume status elevated despite increasing diuretics at home. Start lasix 80 mg IV twice a day. Place PICC for CVP.  Keep off carvedilol hydralazine and imdur for now. Stopped at last visit due to hypotension. No ACEI/ARB/spironolactone with CKD.  2. CAD: S/P STEMI at [facility] in 10/15. Late-presenting, no intervention.No chest pain.   - On apixaban 2.5 mg twice a day so no aspirin. Continue statin.  3. Paroxysmal atrial fibrillation: Regular rhythm. Continue apixaban at reduced dose with age, weight, and creatinine. No bleeding problems. Continue low dose carvedilol.  4. RLE cellulitis- Completed 7 day course of doxycyline but todayin HF clinic  RLE looks worse. Start vancomycin. Consult WOC.  5. Acute on chronic renal failure -stage IV - Suspect cardiorenal syndrome. Add milrinone. Not a candidate for hemodialysis.   Admit to stepdown for volume overload and cellulitis. Place PICC and CVP   Length of Stay:    [name] Clegg NP-C  05/01/2015, 12:59 PM  Advanced Heart Failure Team Pager [phone] (M-F; 7a - 4p)  Please contact [facility] for night-coverage after hours (4p -7a ) and weekends on [email or web address]  Patient seen and examined with Darrick Grinder, NP. We discussed all aspects of the encounter. I agree with the assessment and plan as stated above.   She has persistent volume overload with worsening renal function. Likely low output/cardiorenal. Also with RLE cellulitis. Admit for IV diuresis with milrinone support and IV abx. May need home inotropes. Check renal u/s. Continue apixaban for AF for now.   Betheny Suchecki,MD 4:38 PM

## 2015-05-01 NOTE — Progress Notes (Signed)
ANTIBIOTIC CONSULT NOTE - INITIAL  Pharmacy Consult for Vancomycin  Indication: cellulitis  Allergies  Allergen Reactions  . Lipitor [Atorvastatin] Other (See Comments)    Leg cramping  . Sulfa Antibiotics Other (See Comments)    Eye swelling     Vital Signs: BP: 100/50 mmHg (11/29 1030) Pulse Rate: 62 (11/29 1030) Intake/Output from previous day:   Intake/Output from this shift:    Labs:  Recent Labs  05/01/15 1030  CREATININE 2.25*   Estimated Creatinine Clearance: 13.2 mL/min (by C-G formula based on Cr of 2.25). No results for input(s): VANCOTROUGH, VANCOPEAK, VANCORANDOM, GENTTROUGH, GENTPEAK, GENTRANDOM, TOBRATROUGH, TOBRAPEAK, TOBRARND, AMIKACINPEAK, AMIKACINTROU, AMIKACIN in the last 72 hours.   Microbiology: No results found for this or any previous visit (from the past 720 hour(s)).  Medical History: Past Medical History  Diagnosis Date  . CKD (chronic kidney disease)   . CHF (congestive heart failure) ([place])   . STEMI (ST elevation myocardial infarction) ([place])   . Hypertension   . Diabetes mellitus without complication ([place])   . Difficulty walking   . Overactive bladder   . Hyperlipidemia     Assessment: 36 YOF admitted 05/01/2015 with volume overload and lower extremity cellulitis. S/p 7 day course of doxy outpt with no improvement. SCr 2.25. No CBC, no cultures. Pharmacy consulted to dose Vancomycin.   Goal of Therapy:  Vancomycin trough level 10-15 mcg/ml  Plan:  Vancomycin 500mg  IV Q48 hours  F/U renal fxn, clinical course, VT as necessary  Trend WBC, Tmax   Onelia Cadmus C. Lennox Grumbles, PharmD Pharmacy Resident  Pager: [phone] 05/01/2015 2:34 PM

## 2015-05-01 NOTE — Care Management Note (Signed)
Case Management Note  Patient Details  Name: Leah Kramer MRN: [id number] Date of Birth: 1924-03-01  Subjective/Objective:       Adm w heart failure             Action/Plan: lives w husband, pcp dr Raelene Bott   Expected Discharge Date:                  Expected Discharge Plan:     In-House Referral:     Discharge planning Services     Post Acute Care Choice:    Choice offered to:     DME Arranged:    DME Agency:     HH Arranged:    [place] Agency:     Status of Service:     Medicare Important Message Given:    Date Medicare IM Given:    Medicare IM give by:    Date Additional Medicare IM Given:    Additional Medicare Important Message give by:     If discussed at [place] of Stay Meetings, dates discussed:    Additional Comments: ur review done  Lacretia Leigh, RN 05/01/2015, 2:36 PM

## 2015-05-01 NOTE — Consult Note (Addendum)
[name] Reason for Consult: Consult requested for right foot wound and right leg cellulitis.  Pt was admitted with an ace wrap to LLE but upon removal, there is no open wound or edema and this treatment is not indicated at this time. Left anterior foot and toes with generalized erythremia but no edema or weeping. Wound type: Right outer foot with chronic full thickness wound which is healing.  Pt has been followed by podiatry as an outpatient for this site and they have offloaded affected area with foam doughnut and have been applying antibiotic ointment. Wound is almost healed; .2X.2X.1cm, dry yellow wound bed, no odor or drainage. Right calf with generalized edema and erythremia; affected anterior and posterior calf.  No open wounds; multiple red petechia or previous blisters which have ruptured are scattered to BLE with patchy areas of partial thickness skin loss.  Mod amt yellow drainage is weeping from right calf, no odor, painful to touch. Dressing procedure/placement/frequency: Continue present plan of care to right foot as ordered by podiatry with antibiotic ointment and foam doughnut.  Xeroform to promote drying and healing to right calf, ace wrap for light compression to decrease edema.  Family at bedside to assess wounds and discuss plan of care. Please re-consult if further assistance is needed.  Thank-you,  Julien Girt MSN, [place], [place], [place], [place]

## 2015-05-01 NOTE — Progress Notes (Signed)
Patient ID: Leah Kramer, female   DOB: 11/09/1923, 81 y.o.   MRN: [id number]   PCP: Dr Herma Ard   HPI: 81 year old female with history of heart failure, chronic kidney disease, hypertension, diabetes, CAD.  Per Care Everywhere she presented to [facility] in October with late STEMI. Treated medically. EF 20% and severe MR. Has been struggling with volume management in setting on chronic renal failure.   TTE 03/2014 [facility]): Severe LV dysfunction (EF 20%) with basal inferior, basal anterospetal, and basal anterior hypokinesis, dilated LV, diastolic dysfunction, elevated LV filling pressures, degen MV with severe MR, dilated LA, moderate AR, severe pulmonary hypertension, normal RV function, mild TR, elevated CVP  Admitted 05/05/14  with respiratory failure due to recurrent HF.  Required short tem milrionone and diuresed with IV lasix. She transitioned to torsemide.  She had short episode of A fib and converted spontaneously. Discharge weight was 117 pounds.   She returns for follow up. Last visit hydralazine/imdur/carvedilol stopped due to hypotension. She was also volume overloaded and she was instructed to increase torsemide. Weight at home down from 115 to 106 pounds. Increase exudate from RLE. Has been followed by [facility]. Ongoing dyspnea with exertion. Denies PND/Orthopnea.   Labs 5/16 K 3.5, Cr 2.77 Labs 12/19/2014: K 3.8 Creatinine 2.30  Labs 03/15/2015: K 3.7 Creatinine 2.42 Labs 04/24/2015: K 3.6 Creatinine 2.99   ECHO 09/07/2014 EF 20% Grade II DD Mod Severe MR.   ROS: All systems negative except as listed in HPI, PMH and Problem List.  Past Medical History  Diagnosis Date  . CKD (chronic kidney disease)   . CHF (congestive heart failure) ([place])   . STEMI (ST elevation myocardial infarction) ([place])   . Hypertension   . Diabetes mellitus without complication ([place])   . Difficulty walking   . Overactive bladder   . Hyperlipidemia     Current Outpatient Prescriptions    Medication Sig Dispense Refill  . acetaminophen (TYLENOL) 325 MG tablet Take 650 mg by mouth every 6 (six) hours as needed for mild pain.    Marland Kitchen apixaban (ELIQUIS) 2.5 MG TABS tablet Take 1 tablet (2.5 mg total) by mouth 2 (two) times daily. 180 tablet 3  . calcium carbonate (TUMS - DOSED IN MG ELEMENTAL CALCIUM) 500 MG chewable tablet Chew 1 tablet by mouth 3 (three) times daily as needed for indigestion or heartburn.    . cholecalciferol (VITAMIN D) 400 UNITS TABS tablet Take 400 Units by mouth daily.    . cyanocobalamin (,VITAMIN B-12,) 1000 MCG/ML injection Inject 1,000 mcg into the muscle every 30 (thirty) days. Takes on the 20th of every month    . diclofenac sodium (VOLTAREN) 1 % GEL Apply 4 g topically 4 (four) times daily. 100 g 4  . metolazone (ZAROXOLYN) 2.5 MG tablet Take 1 tablet every Friday. 8 tablet 3  . mupirocin ointment (BACTROBAN) 2 % Apply to wound twice a day. 30 g 2  . omeprazole (PRILOSEC) 20 MG capsule Take 20 mg by mouth daily.     Marland Kitchen oxybutynin (DITROPAN-XL) 10 MG 24 hr tablet Take 1 tablet (10 mg total) by mouth every morning. 30 tablet 0  . polyethylene glycol (MIRALAX / GLYCOLAX) packet Take 17 g by mouth daily as needed for mild constipation.     . potassium chloride SA (K-DUR,KLOR-CON) 20 MEQ tablet Take 1 tablet (20 mEq total) by mouth daily. Take additional 68meq when you take Metolazone 45 tablet 3  . simvastatin (ZOCOR) 20 MG tablet  Take 1 tablet (20 mg total) by mouth daily. 30 tablet 2  . torsemide (DEMADEX) 20 MG tablet Take 2 tablets (40 mg total) by mouth 2 (two) times daily. 120 tablet 3  . vitamin E 400 UNIT capsule Take 400 Units by mouth daily.    . nitroGLYCERIN (NITROSTAT) 0.4 MG SL tablet Place 1 tablet (0.4 mg total) under the tongue every 5 (five) minutes as needed for chest pain. (Patient not taking: Reported on 04/24/2015) 25 tablet 2   No current facility-administered medications for this encounter.     PHYSICAL EXAM: Filed Vitals:    05/01/15 1030  BP: 100/50  Pulse: 62  Height: 5\' 3"  (1.6 m)  Weight: 108 lb 12.8 oz (49.351 kg)  SpO2: 98%    General:  Chronically ill appearing. No resp difficulty. Arrived in a wheel chair. Daughter and husband present.  HEENT: normal Neck: supple. JVP to jaw.. Carotids 2+ bilaterally; no bruits. No lymphadenopathy or thryomegaly appreciated. Cor: PMI normal. Regular rate & rhythm. No rubs, gallops.  2/6 early SEM RUSB.  PT pulses bilaterally.  Lungs: Decreased in the bases. .  Abdomen: soft, nontender, distended. No hepatosplenomegaly. No bruits or masses. Good bowel sounds. Extremities: no cyanosis, clubbing, rash, RLE erythema to with mod serous exudate. R and LLE 2+ edema extends to thigh. .   Neuro: alert & orientedx3, cranial nerves grossly intact. Moves all 4 extremities w/o difficulty. Affect pleasant.  ASSESSMENT & PLAN:  1. Acute/Chronic Systolic Heart Failure: Ischemic cardiomyopathy, EF 20% ECHO 4/16.  She is not a candidate for advanced therapies due to her age.   She has NYHA class III symptoms.  She has progressive worsening of renal function. Will need to start milrinone.  Volume status elevated despite increasing diuretics at home. Start lasix 80 mg IV twice a day.  Place PICC for CVP.   Keep off carvedilol hydralazine and imdur for now. Stopped at last visit due to hypotension.  No ACEI/ARB/spironolactone with CKD.  2. CAD: S/P STEMI at [facility] in 10/15.  Late-presenting, no intervention.   No chest pain.    - On apixaban 2.5 mg twice a day so no aspirin.  Continue statin.  3. Paroxysmal atrial fibrillation: Regular rhythm. Continue apixaban at reduced dose with age, weight, and creatinine. No bleeding problems. Continue low dose carvedilol.  4. RLE cellulitis- Completed 7 day course of doxycyline but today  RLE looks worse.  Start vancomycin. Consult WOC.     5. CKD- Stage IV - Add milrinone.   Admit to stepdown for volume overload and cellulitis.   [name] Clegg NP-C   05/01/2015

## 2015-05-01 NOTE — Progress Notes (Signed)
Peripherally Inserted Central Catheter/Midline Placement  The IV Nurse has discussed with the patient and/or persons authorized to consent for the patient, the purpose of this procedure and the potential benefits and risks involved with this procedure.  The benefits include less needle sticks, lab draws from the catheter and patient may be discharged home with the catheter.  Risks include, but not limited to, infection, bleeding, blood clot (thrombus formation), and puncture of an artery; nerve damage and irregular heat beat.  Alternatives to this procedure were also discussed.  PICC/Midline Placement Documentation  PICC Double Lumen 0000000 PICC Right Basilic 33 cm 0 cm (Active)  Indication for Insertion or Continuance of Line Vasoactive infusions 05/01/2015  6:10 PM  Exposed Catheter (cm) 0 cm 05/01/2015  6:10 PM  Dressing Change Due 05/08/15 05/01/2015  6:10 PM       Jule Economy Horton 05/01/2015, 6:11 PM

## 2015-05-01 NOTE — Progress Notes (Signed)
Advanced Heart Failure Medication Review by a Pharmacist  Does the patient  feel that his/her medications are working for him/her?  yes  Has the patient been experiencing any side effects to the medications prescribed?  no  Does the patient measure his/her own blood pressure or blood glucose at home?  yes   Does the patient have any problems obtaining medications due to transportation or finances?   no  Understanding of regimen: good Understanding of indications: good Potential of compliance: good Patient understands to avoid NSAIDs. Patient understands to avoid decongestants.  Issues to address at subsequent visits: Eliquis patient assistance status   Pharmacist comments:  Leah Kramer is a pleasant 81 yo F presenting with her husband and daughter. She has a good understanding of her regimen and reports excellent compliance. Her daughter brought in the remainder of her Eliquis application which I will fax in today. She did not have any other medication-related questions or concerns at this time.   Leah Kramer, PharmD, BCPS, CPP Clinical Pharmacist Pager: [phone] Phone: [phone] 05/01/2015 10:55 AM      Time with patient: 10 minutes Preparation and documentation time: 2 minutes Total time: 12 minutes

## 2015-05-02 ENCOUNTER — Inpatient Hospital Stay (HOSPITAL_COMMUNITY): Payer: Medicare Other

## 2015-05-02 DIAGNOSIS — L03119 Cellulitis of unspecified part of limb: Secondary | ICD-10-CM

## 2015-05-02 DIAGNOSIS — I509 Heart failure, unspecified: Secondary | ICD-10-CM

## 2015-05-02 DIAGNOSIS — L02419 Cutaneous abscess of limb, unspecified: Secondary | ICD-10-CM

## 2015-05-02 LAB — BASIC METABOLIC PANEL
ANION GAP: 7 (ref 5–15)
BUN: 95 mg/dL — ABNORMAL HIGH (ref 6–20)
CALCIUM: 8.1 mg/dL — AB (ref 8.9–10.3)
CO2: 33 mmol/L — ABNORMAL HIGH (ref 22–32)
CREATININE: 2.06 mg/dL — AB (ref 0.44–1.00)
Chloride: 99 mmol/L — ABNORMAL LOW (ref 101–111)
GFR calc non Af Amer: 20 mL/min — ABNORMAL LOW (ref >60)
GFR, EST AFRICAN AMERICAN: 23 mL/min — AB (ref >60)
Glucose, Bld: 118 mg/dL — ABNORMAL HIGH (ref 65–99)
Potassium: 3.7 mmol/L (ref 3.5–5.1)
SODIUM: 139 mmol/L (ref 135–145)

## 2015-05-02 LAB — CBC
HCT: 32.1 % — ABNORMAL LOW (ref 36.0–46.0)
Hemoglobin: 10.5 g/dL — ABNORMAL LOW (ref 12.0–15.0)
MCH: 31.3 pg (ref 26.0–34.0)
MCHC: 32.7 g/dL (ref 30.0–36.0)
MCV: 95.5 fL (ref 78.0–100.0)
PLATELETS: 202 10*3/uL (ref 150–400)
RBC: 3.36 MIL/uL — ABNORMAL LOW (ref 3.87–5.11)
RDW: 15.1 % (ref 11.5–15.5)
WBC: 7.7 10*3/uL (ref 4.0–10.5)

## 2015-05-02 LAB — CARBOXYHEMOGLOBIN
Carboxyhemoglobin: 1.4 % (ref 0.5–1.5)
Methemoglobin: 0.9 % (ref 0.0–1.5)
O2 SAT: 54.8 %
TOTAL HEMOGLOBIN: 10.5 g/dL — AB (ref 12.0–16.0)

## 2015-05-02 MED ORDER — TRAMADOL HCL 50 MG PO TABS
50.0000 mg | ORAL_TABLET | Freq: Four times a day (QID) | ORAL | Status: DC | PRN
  Filled 2015-05-02: qty 1

## 2015-05-02 NOTE — Progress Notes (Signed)
Echocardiogram 2D Echocardiogram has been performed.  Joelene Millin 05/02/2015, 10:47 AM

## 2015-05-02 NOTE — Progress Notes (Signed)
Advanced Heart Failure Rounding Note   Subjective:    Admitted from HF clinic with cellulitis and volume overload. Started on milrinone and vancomycin. Diuresed with IV lasix. CVP 9-10  Denies SOB/Orthopnea.   Todays CO-OX 55%.   Objective:   Weight Range:  Vital Signs:   Temp:  [97.3 F (36.3 C)-98.6 F (37 C)] 97.7 F (36.5 C) (11/30 0753) Pulse Rate:  [32-131] 73 (11/30 0753) Resp:  [10-23] 15 (11/30 0753) BP: (93-134)/(43-68) 115/62 mmHg (11/30 0753) SpO2:  [92 %-100 %] 95 % (11/30 0753) Weight:  [107 lb 14.4 oz (48.943 kg)-111 lb 9.6 oz (50.621 kg)] 107 lb 14.4 oz (48.943 kg) (11/30 0500) Last BM Date: 04/29/15  Weight change: Filed Weights   05/01/15 1430 05/02/15 0500  Weight: 111 lb 9.6 oz (50.621 kg) 107 lb 14.4 oz (48.943 kg)    Intake/Output:   Intake/Output Summary (Last 24 hours) at 05/02/15 0852 Last data filed at 05/02/15 0753  Gross per 24 hour  Intake 390.75 ml  Output   1125 ml  Net -734.25 ml     Physical Exam: CVP 9-10  General:  Elderly woman. No resp difficulty. In Bed.  HEENT: normal Neck: supple. JVP ~10  . Carotids 2+ bilat; no bruits. No lymphadenopathy or thryomegaly appreciated. Cor: PMI laterally displaced. Regular rate & rhythm. 2/6 AS and 2/6 MR Lungs: clear Abdomen: soft, nontender, nondistended. No hepatosplenomegaly. No bruits or masses. Good bowel sounds. Extremities: no cyanosis, clubbing, rash,  R and LLE 1+ edema. RLE erythema. RUE PICC Neuro: alert & orientedx3, cranial nerves grossly intact. moves all 4 extremities w/o difficulty. Affect pleasant  Telemetry: NSR 70s  Labs: Basic Metabolic Panel:  Recent Labs Lab 05/01/15 1030 05/02/15 0330  NA 140 139  K 3.2* 3.7  CL 97* 99*  CO2 33* 33*  GLUCOSE 120* 118*  BUN 104* 95*  CREATININE 2.25* 2.06*  CALCIUM 8.5* 8.1*    Liver Function Tests: No results for input(s): AST, ALT, ALKPHOS, BILITOT, PROT, ALBUMIN in the last 168 hours. No results for  input(s): LIPASE, AMYLASE in the last 168 hours. No results for input(s): AMMONIA in the last 168 hours.  CBC:  Recent Labs Lab 05/01/15 1825 05/02/15 0330  WBC 7.3 7.7  HGB 11.3* 10.5*  HCT 34.8* 32.1*  MCV 95.9 95.5  PLT 219 202    Cardiac Enzymes: No results for input(s): CKTOTAL, CKMB, CKMBINDEX, TROPONINI in the last 168 hours.  BNP: BNP (last 3 results)  Recent Labs  06/15/14 1421  BNP 3172.6*    ProBNP (last 3 results)  Recent Labs  05/06/14 0840  PROBNP 41228.0*      Other results:  Imaging: US Renal  05/02/2015  CLINICAL DATA:  81 year old female with renal failure. EXAM: RENAL / URINARY TRACT ULTRASOUND COMPLETE COMPARISON:  None. FINDINGS: Right Kidney: Length: 7.7 cm. The right kidney appears echogenic. There is mild to moderate right renal atrophy. There is no hydronephrosis or echogenic stone. Left Kidney: Length: 7.2 cm. There is mild increase echogenicity of the left kidney. There is a 1.0 x 0.8 x 1.2 cm hypoechoic lesion in the inferior pole of the left kidney medially, possibly a cyst. There is no hydronephrosis or echogenic stone. Bladder: The gallbladder is predominantly collapsed. There are bilateral pleural effusions. IMPRESSION: No hydronephrosis or echogenic stone on either side. Increased renal echogenicity likely related to underlying medical renal disease. Clinical correlation is recommended. Bilateral pleural effusions. Electronically Signed   By: Laren Everts.D.  On: 05/02/2015 00:22   Dg Chest Port 1 View  05/02/2015  CLINICAL DATA:  Acute on chronic CHF, acute respiratory failure, chronic renal insufficiency, lower extremity cellulitis. EXAM: PORTABLE CHEST 1 VIEW COMPARISON:  PA and lateral chest x-ray of June 29, 2014 FINDINGS: The lungs are adequately inflated. Bilateral pleural effusions persist but are slightly less conspicuous. The pulmonary interstitial markings remain increased. The cardiac silhouette remains enlarged and  the pulmonary vascularity remains engorged. The right subclavian venous catheter tip projects over the distal third of the SVC. IMPRESSION: Slight interval increase in pulmonary vascular congestion and interstitial edema consistent with CHF. Persistent cardiomegaly and small bilateral pleural effusions. Electronically Signed   By: David  Martinique M.D.   On: 05/02/2015 07:12      Medications:     Scheduled Medications: . apixaban  2.5 mg Oral BID  . cholecalciferol  400 Units Oral Daily  . [START ON 05/22/2015] cyanocobalamin  1,000 mcg Intramuscular Q30 days  . furosemide  80 mg Intravenous BID  . mupirocin cream   Topical Daily  . oxybutynin  10 mg Oral q morning - 10a  . pantoprazole  40 mg Oral Daily  . potassium chloride SA  20 mEq Oral BID  . simvastatin  20 mg Oral q1800  . sodium chloride  10-40 mL Intracatheter Q12H  . sodium chloride  3 mL Intravenous Q12H  . vancomycin  500 mg Intravenous Q48H     Infusions: . milrinone 0.25 mcg/kg/min (05/01/15 1900)     PRN Medications:  sodium chloride, acetaminophen, calcium carbonate, ondansetron (ZOFRAN) IV, oxyCODONE, sodium chloride, sodium chloride   Assessment:   1. A/C Systolic Heart Failure due to ischemic CM  EF 20% 2. A/C Renal Failure, CKD 4 with cardiorenal disease    -Renal Ultrasound with medicorenal disease and mild to moderate R renal atrophy 3. Cellulitis 4. PAF 5. CAD H/O late-presenting anterior STEMI 03/2014    Plan/Discussion:    Volume status improving. Continue IV lasix one more day + milrinone. Renal function coming down. No BB or hydrala/imdur with soft .   WOC recommendations appreciated. Continue topical wound care. Continue IV Vanc.   Renal Ultrasound with medicorenal disease and mild to moderate R renal atrophy. No hydronephrosis.    Remains in NSR. Continue eliquis, reduced dose -age, weight, and renal function.   Consult PT. OOB.    Length of Stay: 1  [name] Clegg  05/02/2015, 8:52  AM  Advanced Heart Failure Team Pager [phone] (M-F; 7a - 4p)  Please contact [facility] for night-coverage after hours (4p -7a ) and weekends on [email or web address]  Patient seen and examined with Darrick Grinder, NP. We discussed all aspects of the encounter. I agree with the assessment and plan as stated above.   She has low output HF with severe cardiorenal syndrome. Improved with milrinone. Would continue IV lasix today. Will need slow milrinone wean. Renal u/s reviewed personally has significant medicorenal disease. Given asymmetric kidney size will do renal artery u/s for RAS. Given advanced heart and renal failure volume management will be difficult. Continue abx for cellulitis. Wound care seeing.   Mildred Bollard,MD 10:17 AM

## 2015-05-03 ENCOUNTER — Inpatient Hospital Stay (HOSPITAL_COMMUNITY): Payer: Medicare Other

## 2015-05-03 LAB — BASIC METABOLIC PANEL
ANION GAP: 7 (ref 5–15)
BUN: 82 mg/dL — ABNORMAL HIGH (ref 6–20)
CHLORIDE: 98 mmol/L — AB (ref 101–111)
CO2: 34 mmol/L — ABNORMAL HIGH (ref 22–32)
Calcium: 8.2 mg/dL — ABNORMAL LOW (ref 8.9–10.3)
Creatinine, Ser: 2.02 mg/dL — ABNORMAL HIGH (ref 0.44–1.00)
GFR calc Af Amer: 24 mL/min — ABNORMAL LOW (ref >60)
GFR, EST NON AFRICAN AMERICAN: 21 mL/min — AB (ref >60)
Glucose, Bld: 120 mg/dL — ABNORMAL HIGH (ref 65–99)
POTASSIUM: 4.2 mmol/L (ref 3.5–5.1)
SODIUM: 139 mmol/L (ref 135–145)

## 2015-05-03 LAB — URIC ACID: Uric Acid, Serum: 12 mg/dL — ABNORMAL HIGH (ref 2.3–6.6)

## 2015-05-03 LAB — CARBOXYHEMOGLOBIN
Carboxyhemoglobin: 1.5 % (ref 0.5–1.5)
Methemoglobin: 1 % (ref 0.0–1.5)
O2 Saturation: 70.4 %
Total hemoglobin: 10.6 g/dL — ABNORMAL LOW (ref 12.0–16.0)

## 2015-05-03 LAB — MAGNESIUM: Magnesium: 2.6 mg/dL — ABNORMAL HIGH (ref 1.7–2.4)

## 2015-05-03 NOTE — Plan of Care (Addendum)
Called regarding tachycardia in 130s sustained.  Review of tele shows multiple bouts of what appears to be AT in the 130s.  Pt broke out of it back into SR while reviewing telemetry.  No changes done.  Dolph Tavano 05/03/2015 11:00 PM

## 2015-05-03 NOTE — Progress Notes (Signed)
Advanced Heart Failure Rounding Note   Subjective:    Admitted from HF clinic with cellulitis and volume overload. Started on milrinone and vancomycin.  She continues to diurese with IV lasix. Weight down 3 pounds. Difficulty standing. Renal function improving.   Denies SOB/Orthopnea.   Todays CO-OX 70%   Objective:   Weight Range:  Vital Signs:   Temp:  [97.7 F (36.5 C)-98.9 F (37.2 C)] 98.2 F (36.8 C) (12/01 0404) Pulse Rate:  [71-97] 82 (12/01 0600) Resp:  [12-24] 12 (12/01 0600) BP: (96-122)/(42-68) 110/59 mmHg (12/01 0600) SpO2:  [90 %-100 %] 100 % (12/01 0600) Weight:  [111 lb 15.9 oz (50.8 kg)] 111 lb 15.9 oz (50.8 kg) (12/01 0404) Last BM Date: 04/29/15  Weight change: Filed Weights   05/01/15 1430 05/02/15 0500 05/03/15 0404  Weight: 111 lb 9.6 oz (50.621 kg) 107 lb 14.4 oz (48.943 kg) 111 lb 15.9 oz (50.8 kg)    Intake/Output:   Intake/Output Summary (Last 24 hours) at 05/03/15 0750 Last data filed at 05/02/15 1900  Gross per 24 hour  Intake  305.4 ml  Output    700 ml  Net -394.6 ml     Physical Exam: CVP ~10  General:  Elderly woman. No resp difficulty. In the chair.   HEENT: normal Neck: supple. JVP ~10  . Carotids 2+ bilat; no bruits. No lymphadenopathy or thryomegaly appreciated. Cor: PMI laterally displaced. Regular rate & rhythm. 2/6 AS and 2/6 MR Lungs: clear Abdomen: soft, nontender, nondistended. No hepatosplenomegaly. No bruits or masses. Good bowel sounds. Extremities: no cyanosis, clubbing, rash,  R and LLE trace to 1+ edema. RLE dressing in place. RUE PICC Neuro: alert & orientedx3, cranial nerves grossly intact. moves all 4 extremities w/o difficulty. Affect pleasant  Telemetry: NSR 70s  Labs: Basic Metabolic Panel:  Recent Labs Lab 05/01/15 1030 05/02/15 0330 05/03/15 0516  NA 140 139 139  K 3.2* 3.7 4.2  CL 97* 99* 98*  CO2 33* 33* 34*  GLUCOSE 120* 118* 120*  BUN 104* 95* 82*  CREATININE 2.25* 2.06* 2.02*    CALCIUM 8.5* 8.1* 8.2*  MG  --   --  2.6*    Liver Function Tests: No results for input(s): AST, ALT, ALKPHOS, BILITOT, PROT, ALBUMIN in the last 168 hours. No results for input(s): LIPASE, AMYLASE in the last 168 hours. No results for input(s): AMMONIA in the last 168 hours.  CBC:  Recent Labs Lab 05/01/15 1825 05/02/15 0330  WBC 7.3 7.7  HGB 11.3* 10.5*  HCT 34.8* 32.1*  MCV 95.9 95.5  PLT 219 202    Cardiac Enzymes: No results for input(s): CKTOTAL, CKMB, CKMBINDEX, TROPONINI in the last 168 hours.  BNP: BNP (last 3 results)  Recent Labs  06/15/14 1421  BNP 3172.6*    ProBNP (last 3 results)  Recent Labs  05/06/14 0840  PROBNP 41228.0*      Other results:  Imaging: US Renal  05/02/2015  CLINICAL DATA:  81 year old female with renal failure. EXAM: RENAL / URINARY TRACT ULTRASOUND COMPLETE COMPARISON:  None. FINDINGS: Right Kidney: Length: 7.7 cm. The right kidney appears echogenic. There is mild to moderate right renal atrophy. There is no hydronephrosis or echogenic stone. Left Kidney: Length: 7.2 cm. There is mild increase echogenicity of the left kidney. There is a 1.0 x 0.8 x 1.2 cm hypoechoic lesion in the inferior pole of the left kidney medially, possibly a cyst. There is no hydronephrosis or echogenic stone. Bladder: The gallbladder  is predominantly collapsed. There are bilateral pleural effusions. IMPRESSION: No hydronephrosis or echogenic stone on either side. Increased renal echogenicity likely related to underlying medical renal disease. Clinical correlation is recommended. Bilateral pleural effusions. Electronically Signed   By: Anner Crete M.D.   On: 05/02/2015 00:22   Dg Chest Port 1 View  05/02/2015  CLINICAL DATA:  Acute on chronic CHF, acute respiratory failure, chronic renal insufficiency, lower extremity cellulitis. EXAM: PORTABLE CHEST 1 VIEW COMPARISON:  PA and lateral chest x-ray of June 29, 2014 FINDINGS: The lungs are  adequately inflated. Bilateral pleural effusions persist but are slightly less conspicuous. The pulmonary interstitial markings remain increased. The cardiac silhouette remains enlarged and the pulmonary vascularity remains engorged. The right subclavian venous catheter tip projects over the distal third of the SVC. IMPRESSION: Slight interval increase in pulmonary vascular congestion and interstitial edema consistent with CHF. Persistent cardiomegaly and small bilateral pleural effusions. Electronically Signed   By: David  Martinique M.D.   On: 05/02/2015 07:12     Medications:     Scheduled Medications: . apixaban  2.5 mg Oral BID  . cholecalciferol  400 Units Oral Daily  . [START ON 05/22/2015] cyanocobalamin  1,000 mcg Intramuscular Q30 days  . furosemide  80 mg Intravenous BID  . mupirocin cream   Topical Daily  . oxybutynin  10 mg Oral q morning - 10a  . pantoprazole  40 mg Oral Daily  . potassium chloride SA  20 mEq Oral BID  . simvastatin  20 mg Oral q1800  . sodium chloride  10-40 mL Intracatheter Q12H  . sodium chloride  3 mL Intravenous Q12H  . vancomycin  500 mg Intravenous Q48H    Infusions: . milrinone 0.25 mcg/kg/min (05/03/15 0700)    PRN Medications: sodium chloride, acetaminophen, calcium carbonate, ondansetron (ZOFRAN) IV, oxyCODONE, sodium chloride, sodium chloride, traMADol   Assessment:   1. A/C Systolic Heart Failure due to ischemic CM  EF 20% 2. A/C Renal Failure, CKD 4 with cardiorenal disease    -Renal Ultrasound with medicorenal disease and mild to moderate R renal atrophy 3. Cellulitis 4. PAF 5. CAD H/O late-presenting anterior STEMI 03/2014    Plan/Discussion:    Volume status improving. Will need to continue IV lasix one more day + milrinone. Renal function coming down. No BB or hydrala/imdur with soft .   WOC recommendations appreciated. Continue topical wound care. Continue IV Vanc.   Renal Ultrasound with medicorenal disease and mild to  moderate R renal atrophy. No hydronephrosis.    Remains in NSR. Continue eliquis, reduced dose -age, weight, and renal function.   Consult PT. OOB. Concerned about mobility as she is having difficulty standing. Check uric acid. May need rehab.     Length of Stay: 2  [name] Clegg NP-C  05/03/2015, 7:50 AM  Advanced Heart Failure Team Pager [phone] (M-F; 7a - 4p)  Please contact [facility] for night-coverage after hours (4p -7a ) and weekends on [email or web address]  Patient seen and examined with Darrick Grinder, NP. We discussed all aspects of the encounter. I agree with the assessment and plan as stated above.   Volume status and renal function much improved on IV lasix and milrinone. Co-ox looks good. Will continue current regimen for 1 more day then consider trying to wean milrinone. Cellulitis also improving. Continue vanc for now. Continue PT. Agree she may need SNF.  Goerge Mohr,MD 1:39 PM

## 2015-05-04 ENCOUNTER — Inpatient Hospital Stay (HOSPITAL_COMMUNITY): Payer: Medicare Other

## 2015-05-04 DIAGNOSIS — I471 Supraventricular tachycardia: Secondary | ICD-10-CM

## 2015-05-04 DIAGNOSIS — N184 Chronic kidney disease, stage 4 (severe): Secondary | ICD-10-CM

## 2015-05-04 DIAGNOSIS — N19 Unspecified kidney failure: Secondary | ICD-10-CM

## 2015-05-04 LAB — BASIC METABOLIC PANEL
ANION GAP: 6 (ref 5–15)
BUN: 74 mg/dL — ABNORMAL HIGH (ref 6–20)
CHLORIDE: 101 mmol/L (ref 101–111)
CO2: 34 mmol/L — ABNORMAL HIGH (ref 22–32)
Calcium: 8 mg/dL — ABNORMAL LOW (ref 8.9–10.3)
Creatinine, Ser: 1.9 mg/dL — ABNORMAL HIGH (ref 0.44–1.00)
GFR, EST AFRICAN AMERICAN: 26 mL/min — AB (ref >60)
GFR, EST NON AFRICAN AMERICAN: 22 mL/min — AB (ref >60)
Glucose, Bld: 129 mg/dL — ABNORMAL HIGH (ref 65–99)
POTASSIUM: 4.3 mmol/L (ref 3.5–5.1)
SODIUM: 141 mmol/L (ref 135–145)

## 2015-05-04 LAB — CARBOXYHEMOGLOBIN
CARBOXYHEMOGLOBIN: 1.3 % (ref 0.5–1.5)
METHEMOGLOBIN: 0.7 % (ref 0.0–1.5)
O2 SAT: 69.3 %
TOTAL HEMOGLOBIN: 10.8 g/dL — AB (ref 12.0–16.0)

## 2015-05-04 LAB — MAGNESIUM: Magnesium: 2.5 mg/dL — ABNORMAL HIGH (ref 1.7–2.4)

## 2015-05-04 MED ORDER — AMIODARONE IV BOLUS ONLY 150 MG/100ML
150.0000 mg | Freq: Once | INTRAVENOUS | Status: AC
  Administered 2015-05-04 (×2): 150 mg via INTRAVENOUS

## 2015-05-04 MED ORDER — METOLAZONE 2.5 MG PO TABS
2.5000 mg | ORAL_TABLET | Freq: Once | ORAL | Status: AC
  Administered 2015-05-04: 2.5 mg via ORAL
  Filled 2015-05-04: qty 1

## 2015-05-04 MED ORDER — AMIODARONE HCL IN DEXTROSE 360-4.14 MG/200ML-% IV SOLN
INTRAVENOUS | Status: AC
  Filled 2015-05-04: qty 200

## 2015-05-04 NOTE — Progress Notes (Signed)
*  PRELIMINARY RESULTS* Vascular Ultrasound Renal Artery Duplex has been completed.  Findings suggest 1-59% renal artery stenosis bilaterally. There is evidence of elevated resistive indices of bilateral intrarenal arteries. There is evidence of elevated superior mesenteric artery velocities, suggestive of stenosis >70%.  Incidental findings: There is an avascular heterogenous area visualized within the gallbladder; etiology unknown. There is a focal area of moderate to severe heterogenous plaque in the mid aorta.  05/04/2015 9:45 AM Maudry Mayhew, RVT, RDCS, RDMS

## 2015-05-04 NOTE — Care Management Important Message (Signed)
Important Message  Patient Details  Name: Leah Kramer MRN: [id number] Date of Birth: 03/11/1924   Medicare Important Message Given:  Yes    Ilaisaane Marts P Yittel Emrich 05/04/2015, 1:12 PM

## 2015-05-04 NOTE — Progress Notes (Signed)
ANTIBIOTIC CONSULT NOTE   Pharmacy Consult for Vancomycin  Indication: cellulitis  Allergies  Allergen Reactions  . Lipitor [Atorvastatin] Other (See Comments)    Leg cramping  . Sulfa Antibiotics Other (See Comments)    Eye swelling   Height: 5\' 3"  (160 cm) Weight: 117 lb 1.6 oz (53.116 kg) IBW/kg (Calculated) : 52.4 Vital Signs: Temp: 98.5 F (36.9 C) (12/02 1200) Temp Source: Oral (12/02 1200) BP: 101/62 mmHg (12/02 1200) Pulse Rate: 130 (12/02 1200) Intake/Output from previous day: 12/01 0701 - 12/02 0700 In: 1062.1 [P.O.:800; I.V.:162.1; IV Piggyback:100] Out: 1400 [Urine:1400] Intake/Output from this shift: Total I/O In: 30 [I.V.:30] Out: 500 [Urine:500]  Labs:  Recent Labs  05/01/15 1825 05/02/15 0330 05/03/15 0516 05/04/15 0430  WBC 7.3 7.7  --   --   HGB 11.3* 10.5*  --   --   PLT 219 202  --   --   CREATININE  --  2.06* 2.02* 1.90*   Estimated Creatinine Clearance: 16.6 mL/min (by C-G formula based on Cr of 1.9). No results for input(s): VANCOTROUGH, VANCOPEAK, VANCORANDOM, GENTTROUGH, GENTPEAK, GENTRANDOM, TOBRATROUGH, TOBRAPEAK, TOBRARND, AMIKACINPEAK, AMIKACINTROU, AMIKACIN in the last 72 hours.   Microbiology: Recent Results (from the past 720 hour(s))  MRSA PCR Screening     Status: None   Collection Time: 05/01/15  2:47 PM  Result Value Ref Range Status   MRSA by PCR NEGATIVE NEGATIVE Final    Comment:        The GeneXpert MRSA Assay (FDA approved for NASAL specimens only), is one component of a comprehensive MRSA colonization surveillance program. It is not intended to diagnose MRSA infection nor to guide or monitor treatment for MRSA infections.     Medical History: Past Medical History  Diagnosis Date  . CKD (chronic kidney disease)   . CHF (congestive heart failure) ([place])   . STEMI (ST elevation myocardial infarction) ([place])   . Hypertension   . Diabetes mellitus without complication ([place])   . Difficulty walking   .  Overactive bladder   . Hyperlipidemia     Assessment: 69 YOF admitted 05/01/2015 with volume overload and lower extremity cellulitis. S/p 7 day course of doxy outpt with no improvement.   Afeb, wbc within normal limits. SCr down 2.25>>1.9. Plan to change to po abx soon, will continue IV vancomycin for now. May need to check trough level before next dose tomorrow or Monday.  Goal of Therapy:  Vancomycin trough level 10-15 mcg/ml  Plan:  Vancomycin 500mg  IV Q48 hours  - next dose due 12/3 F/U renal fxn, clinical course, VT as necessary  Trend WBC, Tmax   Erin Hearing PharmD., BCPS Clinical Pharmacist Pager [phone] 05/04/2015 1:12 PM

## 2015-05-04 NOTE — Progress Notes (Signed)
Advanced Heart Failure Rounding Note   Subjective:    Admitted from HF clinic with cellulitis and volume overload. Started on milrinone and vancomycin.  Developed several bouts of SVT at 130-140 last night. Broke spontaneously.   Remains on milrinone. Weak. Denies SOB/Orthopnea. Creatinine continues to improve 2.0->1.9. I/Os -340cc but weight innaccurate  Todays CO-OX 69% CVP 12  Objective:   Weight Range:  Vital Signs:   Temp:  [97.9 F (36.6 C)-98.4 F (36.9 C)] 98.2 F (36.8 C) (12/02 0356) Pulse Rate:  [77-106] 84 (12/02 0600) Resp:  [11-19] 11 (12/02 0600) BP: (97-124)/(40-102) 117/56 mmHg (12/02 0600) SpO2:  [94 %-99 %] 98 % (12/02 0600) Weight:  [48.988 kg (108 lb)-53.116 kg (117 lb 1.6 oz)] 53.116 kg (117 lb 1.6 oz) (12/02 0356) Last BM Date: 04/29/15  Weight change: Filed Weights   05/03/15 0404 05/03/15 0749 05/04/15 0356  Weight: 50.8 kg (111 lb 15.9 oz) 48.988 kg (108 lb) 53.116 kg (117 lb 1.6 oz)    Intake/Output:   Intake/Output Summary (Last 24 hours) at 05/04/15 0731 Last data filed at 05/04/15 0600  Gross per 24 hour  Intake 1062.1 ml  Output   1400 ml  Net -337.9 ml     Physical Exam: CVP ~12 General:  Elderly woman. No resp difficulty. In the chair.   HEENT: normal Neck: supple. JVP ~12  . Carotids 2+ bilat; no bruits. No lymphadenopathy or thryomegaly appreciated. Cor: PMI laterally displaced. Regular rate & rhythm. 2/6 AS and 2/6 MR Lungs: clear Abdomen: soft, nontender, nondistended. No hepatosplenomegaly. No bruits or masses. Good bowel sounds. Extremities: no cyanosis, clubbing, rash,  R and LLE trace to tr edema. RLE wrap. RUE PICC Neuro: alert & orientedx3, cranial nerves grossly intact. moves all 4 extremities w/o difficulty. Affect pleasant  Telemetry: NSR with multiple bouts SVT overnight 130-140  Labs: Basic Metabolic Panel:  Recent Labs Lab 05/01/15 1030 05/02/15 0330 05/03/15 0516 05/04/15 0430  NA 140 139 139 141   K 3.2* 3.7 4.2 4.3  CL 97* 99* 98* 101  CO2 33* 33* 34* 34*  GLUCOSE 120* 118* 120* 129*  BUN 104* 95* 82* 74*  CREATININE 2.25* 2.06* 2.02* 1.90*  CALCIUM 8.5* 8.1* 8.2* 8.0*  MG  --   --  2.6*  --     Liver Function Tests: No results for input(s): AST, ALT, ALKPHOS, BILITOT, PROT, ALBUMIN in the last 168 hours. No results for input(s): LIPASE, AMYLASE in the last 168 hours. No results for input(s): AMMONIA in the last 168 hours.  CBC:  Recent Labs Lab 05/01/15 1825 05/02/15 0330  WBC 7.3 7.7  HGB 11.3* 10.5*  HCT 34.8* 32.1*  MCV 95.9 95.5  PLT 219 202    Cardiac Enzymes: No results for input(s): CKTOTAL, CKMB, CKMBINDEX, TROPONINI in the last 168 hours.  BNP: BNP (last 3 results)  Recent Labs  06/15/14 1421  BNP 3172.6*    ProBNP (last 3 results)  Recent Labs  05/06/14 0840  PROBNP 41228.0*      Other results:  Imaging: No results found.   Medications:     Scheduled Medications: . apixaban  2.5 mg Oral BID  . cholecalciferol  400 Units Oral Daily  . [START ON 05/22/2015] cyanocobalamin  1,000 mcg Intramuscular Q30 days  . furosemide  80 mg Intravenous BID  . mupirocin cream   Topical Daily  . oxybutynin  10 mg Oral q morning - 10a  . pantoprazole  40 mg Oral Daily  .  potassium chloride SA  20 mEq Oral BID  . simvastatin  20 mg Oral q1800  . sodium chloride  10-40 mL Intracatheter Q12H  . sodium chloride  3 mL Intravenous Q12H  . vancomycin  500 mg Intravenous Q48H    Infusions: . milrinone 0.25 mcg/kg/min (05/03/15 1900)    PRN Medications: sodium chloride, acetaminophen, calcium carbonate, ondansetron (ZOFRAN) IV, oxyCODONE, sodium chloride, sodium chloride, traMADol   Assessment:   1. A/C Systolic Heart Failure due to ischemic CM  EF 20% 2. A/C Renal Failure, CKD 4 with cardiorenal disease    -Renal Ultrasound with medicorenal disease and mild to moderate R renal atrophy 3. Cellulitis 4. PAF 5. CAD H/O late-presenting  anterior STEMI 03/2014  6. PSVT   Plan/Discussion:    Volume status and renal function improving on IV lasix and milrinone. Co-ox looks good. With SVT will drop milrinone to 0.125. Continue IV lasix today. Give one dose metolazone. Check mag.  Cellulitis also improving. Continue vanc for now - can switch to Keflex soon. Continue PT. She will likelly need SNF. WOC recommendations appreciated. Continue topical wound care.   Renal Ultrasound with medicorenal disease and mild to moderate R renal atrophy. No hydronephrosis.    Has PAF. Continue eliquis, reduced dose -age, weight, and renal function.   Need to readdress code status.   Length of Stay: 3  Zaylen Susman MD  05/04/2015, 7:31 AM  Advanced Heart Failure Team Pager [phone] (M-F; [place])  Please contact [facility] for night-coverage after hours (4p -7a ) and weekends on [email or web address]

## 2015-05-04 NOTE — Progress Notes (Signed)
Pt Hr regular and 130-140 BPM at this time. Pt states she feels short of breath.Pt advised to cough two separate occasions. Hr sustained 130-140 BPM. MD notified new order obtained. Will continue to monitor.

## 2015-05-05 DIAGNOSIS — M1000 Idiopathic gout, unspecified site: Secondary | ICD-10-CM

## 2015-05-05 LAB — BASIC METABOLIC PANEL
Anion gap: 7 (ref 5–15)
BUN: 68 mg/dL — ABNORMAL HIGH (ref 6–20)
CHLORIDE: 101 mmol/L (ref 101–111)
CO2: 33 mmol/L — AB (ref 22–32)
CREATININE: 1.96 mg/dL — AB (ref 0.44–1.00)
Calcium: 8.2 mg/dL — ABNORMAL LOW (ref 8.9–10.3)
GFR calc non Af Amer: 21 mL/min — ABNORMAL LOW (ref >60)
GFR, EST AFRICAN AMERICAN: 25 mL/min — AB (ref >60)
Glucose, Bld: 132 mg/dL — ABNORMAL HIGH (ref 65–99)
POTASSIUM: 4.5 mmol/L (ref 3.5–5.1)
Sodium: 141 mmol/L (ref 135–145)

## 2015-05-05 LAB — VANCOMYCIN, TROUGH: VANCOMYCIN TR: 6 ug/mL — AB (ref 10.0–20.0)

## 2015-05-05 LAB — CARBOXYHEMOGLOBIN
Carboxyhemoglobin: 1.4 % (ref 0.5–1.5)
METHEMOGLOBIN: 0.7 % (ref 0.0–1.5)
O2 SAT: 61.5 %
Total hemoglobin: 10.1 g/dL — ABNORMAL LOW (ref 12.0–16.0)

## 2015-05-05 MED ORDER — VANCOMYCIN HCL 500 MG IV SOLR
500.0000 mg | INTRAVENOUS | Status: DC
  Administered 2015-05-06: 500 mg via INTRAVENOUS
  Filled 2015-05-05 (×2): qty 500

## 2015-05-05 MED ORDER — PREDNISONE 20 MG PO TABS
40.0000 mg | ORAL_TABLET | Freq: Every day | ORAL | Status: AC
  Administered 2015-05-06 – 2015-05-08 (×3): 40 mg via ORAL
  Filled 2015-05-05 (×3): qty 2

## 2015-05-05 NOTE — Progress Notes (Addendum)
Advanced Heart Failure Rounding Note   Subjective:    Admitted from HF clinic with cellulitis and volume overload. Started on milrinone and vancomycin.  Milrinone remains at 0.125 (was turned down on 12/2) Had recurrent SVT at 130-140 yesterday. Broke with IV amio.  Weak. Denies SOB/Orthopnea. R ankle hurts. Creatinine  Stable at 1.9   I/Os -2.3L   Todays CO-OX 62% CVP 7  Objective:   Weight Range:  Vital Signs:   Temp:  [97.4 F (36.3 C)-98 F (36.7 C)] 97.4 F (36.3 C) (12/03 1230) Pulse Rate:  [74-140] 79 (12/03 1230) Resp:  [15-24] 18 (12/03 1230) BP: (90-112)/(49-85) 90/49 mmHg (12/03 1230) SpO2:  [92 %-100 %] 100 % (12/03 1230) FiO2 (%):  [2 %] 2 % (12/02 1745) Weight:  [49.397 kg (108 lb 14.4 oz)] 49.397 kg (108 lb 14.4 oz) (12/03 0100) Last BM Date: 05/01/15  Weight change: Filed Weights   05/03/15 0749 05/04/15 0356 05/05/15 0100  Weight: 48.988 kg (108 lb) 53.116 kg (117 lb 1.6 oz) 49.397 kg (108 lb 14.4 oz)    Intake/Output:   Intake/Output Summary (Last 24 hours) at 05/05/15 1240 Last data filed at 05/05/15 1003  Gross per 24 hour  Intake 1235.8 ml  Output   1825 ml  Net -589.2 ml     Physical Exam: CVP ~7 General:  Elderly woman. No resp difficulty. In the chair.   HEENT: normal Neck: supple. JVP 7. Carotids 2+ bilat; no bruits. No lymphadenopathy or thryomegaly appreciated. Cor: PMI laterally displaced. Regular rate & rhythm. 2/6 AS and 2/6 MR Lungs: clear Abdomen: soft, nontender, nondistended. No hepatosplenomegaly. No bruits or masses. Good bowel sounds. Extremities: no cyanosis, clubbing, rash,  R and LLE trace to tr edema. RLE wrap. RUE PICC  R ankle very sore to touch Neuro: alert & orientedx3, cranial nerves grossly intact. moves all 4 extremities w/o difficulty. Affect pleasant  Telemetry: NSR with occasional bouts SVT overnight 130-140. Only one brief run SVT this am  Labs: Basic Metabolic Panel:  Recent Labs Lab  05/01/15 1030 05/02/15 0330 05/03/15 0516 05/04/15 0430 05/04/15 1120 05/05/15 0547  NA 140 139 139 141  --  141  K 3.2* 3.7 4.2 4.3  --  4.5  CL 97* 99* 98* 101  --  101  CO2 33* 33* 34* 34*  --  33*  GLUCOSE 120* 118* 120* 129*  --  132*  BUN 104* 95* 82* 74*  --  68*  CREATININE 2.25* 2.06* 2.02* 1.90*  --  1.96*  CALCIUM 8.5* 8.1* 8.2* 8.0*  --  8.2*  MG  --   --  2.6*  --  2.5*  --     Liver Function Tests: No results for input(s): AST, ALT, ALKPHOS, BILITOT, PROT, ALBUMIN in the last 168 hours. No results for input(s): LIPASE, AMYLASE in the last 168 hours. No results for input(s): AMMONIA in the last 168 hours.  CBC:  Recent Labs Lab 05/01/15 1825 05/02/15 0330  WBC 7.3 7.7  HGB 11.3* 10.5*  HCT 34.8* 32.1*  MCV 95.9 95.5  PLT 219 202    Cardiac Enzymes: No results for input(s): CKTOTAL, CKMB, CKMBINDEX, TROPONINI in the last 168 hours.  BNP: BNP (last 3 results)  Recent Labs  06/15/14 1421  BNP 3172.6*    ProBNP (last 3 results)  Recent Labs  05/06/14 0840  PROBNP 41228.0*      Other results:  Imaging: No results found.   Medications:  Scheduled Medications: . apixaban  2.5 mg Oral BID  . cholecalciferol  400 Units Oral Daily  . [START ON 05/22/2015] cyanocobalamin  1,000 mcg Intramuscular Q30 days  . furosemide  80 mg Intravenous BID  . mupirocin cream   Topical Daily  . oxybutynin  10 mg Oral q morning - 10a  . pantoprazole  40 mg Oral Daily  . potassium chloride SA  20 mEq Oral BID  . simvastatin  20 mg Oral q1800  . sodium chloride  10-40 mL Intracatheter Q12H  . sodium chloride  3 mL Intravenous Q12H  . vancomycin  500 mg Intravenous Q48H    Infusions: . milrinone 0.125 mcg/kg/min (05/04/15 1900)    PRN Medications: sodium chloride, acetaminophen, calcium carbonate, ondansetron (ZOFRAN) IV, oxyCODONE, sodium chloride, sodium chloride, traMADol   Assessment:   1. A/C Systolic Heart Failure due to ischemic CM   EF 20% 2. A/C Renal Failure, CKD 4 with cardiorenal disease    -Renal Ultrasound with medicorenal disease and mild to moderate R renal atrophy 3. Cellulitis 4. PAF 5. CAD H/O late-presenting anterior STEMI 03/2014  6. PSVT 7. Acute gout in R ankle  Plan/Discussion:    Volume status and renal function improving on IV lasix and milrinone. Co-ox and CVP look good. Will stop milrinone and switch back to po diuretics. Watch co-ox and CVP closely. Discussed potential need for home milrinone but PSVT complicates matters. If needs milrinone will likely also need amio. Hopefully we can avoid home inotropes.   Cellulitis  improving. Continue vanc for now - can switch to Keflex soon. Continue PT. She will likelly need SNF. WOC recommendations appreciated. Continue topical wound care.   Renal Ultrasound with medicorenal disease and mild to moderate R renal atrophy. No hydronephrosis.  Renal artery u/s done yesterday. Read not finalized in computer yet. Will f/u.   Acute gout. Start prednisone.  Has PAF. Continue eliquis, reduced dose -age, weight, and renal function.   Need to readdress code status prior to d/c.   Length of Stay: 4  Bensimhon, Daniel MD  05/05/2015, 12:40 PM  Advanced Heart Failure Team Pager [phone] (M-F; 7a - 4p)  Please contact [facility] for night-coverage after hours (4p -7a ) and weekends on [email or web address]

## 2015-05-05 NOTE — Discharge Instructions (Signed)
Information on my medicine - ELIQUIS (apixaban)  This medication education was reviewed with me or my healthcare representative as part of my discharge preparation.  The pharmacist that spoke with me during my hospital stay was:  Adora Fridge, [facility]  Why was Eliquis prescribed for you? Eliquis was prescribed for you to reduce the risk of forming blood clots that can cause a stroke if you have a medical condition called atrial fibrillation (a type of irregular heartbeat) OR to reduce the risk of a blood clots forming after orthopedic surgery.  What do You need to know about Eliquis ? Take your Eliquis TWICE DAILY - one tablet in the morning and one tablet in the evening with or without food.  It would be best to take the doses about the same time each day.  If you have difficulty swallowing the tablet whole please discuss with your pharmacist how to take the medication safely.  Take Eliquis exactly as prescribed by your doctor and DO NOT stop taking Eliquis without talking to the doctor who prescribed the medication.  Stopping may increase your risk of developing a new clot or stroke.  Refill your prescription before you run out.  After discharge, you should have regular check-up appointments with your healthcare provider that is prescribing your Eliquis.  In the future your dose may need to be changed if your kidney function or weight changes by a significant amount or as you get older.  What do you do if you miss a dose? If you miss a dose, take it as soon as you remember on the same day and resume taking twice daily.  Do not take more than one dose of ELIQUIS at the same time.  Important Safety Information A possible side effect of Eliquis is bleeding. You should call your healthcare provider right away if you experience any of the following: ? Bleeding from an injury or your nose that does not stop. ? Unusual colored urine (red or dark brown) or unusual colored stools (red or  black). ? Unusual bruising for unknown reasons. ? A serious fall or if you hit your head (even if there is no bleeding).  Some medicines may interact with Eliquis and might increase your risk of bleeding or clotting while on Eliquis. To help avoid this, consult your healthcare provider or pharmacist prior to using any new prescription or non-prescription medications, including herbals, vitamins, non-steroidal anti-inflammatory drugs (NSAIDs) and supplements.  This website has more information on Eliquis (apixaban): [email or web address].

## 2015-05-05 NOTE — Progress Notes (Signed)
ANTIBIOTIC CONSULT NOTE   Pharmacy Consult for Vancomycin  Indication: cellulitis  Allergies  Allergen Reactions  . Lipitor [Atorvastatin] Other (See Comments)    Leg cramping  . Sulfa Antibiotics Other (See Comments)    Eye swelling   Height: 5\' 3"  (160 cm) Weight: 108 lb 14.4 oz (49.397 kg) IBW/kg (Calculated) : 52.4 Vital Signs: Temp: 97.4 F (36.3 C) (12/03 1230) Temp Source: Oral (12/03 1230) BP: 90/49 mmHg (12/03 1230) Pulse Rate: 79 (12/03 1230) Intake/Output from previous day: 12/02 0701 - 12/03 0700 In: 1105.8 [P.O.:880; I.V.:225.8] Out: 2300 [Urine:2300] Intake/Output from this shift: Total I/O In: 506.7 [P.O.:480; I.V.:26.7] Out: 875 [Urine:875]  Labs:  Recent Labs  05/03/15 0516 05/04/15 0430 05/05/15 0547  CREATININE 2.02* 1.90* 1.96*   Estimated Creatinine Clearance: 15.2 mL/min (by C-G formula based on Cr of 1.96).  Recent Labs  05/05/15 1432  [place] 6*     Microbiology: Recent Results (from the past 720 hour(s))  MRSA PCR Screening     Status: None   Collection Time: 05/01/15  2:47 PM  Result Value Ref Range Status   MRSA by PCR NEGATIVE NEGATIVE Final    Comment:        The GeneXpert MRSA Assay (FDA approved for NASAL specimens only), is one component of a comprehensive MRSA colonization surveillance program. It is not intended to diagnose MRSA infection nor to guide or monitor treatment for MRSA infections.     Medical History: Past Medical History  Diagnosis Date  . CKD (chronic kidney disease)   . CHF (congestive heart failure) ([place])   . STEMI (ST elevation myocardial infarction) ([place])   . Hypertension   . Diabetes mellitus without complication ([place])   . Difficulty walking   . Overactive bladder   . Hyperlipidemia     Assessment: Leah Kramer admitted 05/01/2015 with volume overload and lower extremity cellulitis. S/p 7 day course of doxy outpt with no improvement.   Afeb, wbc within normal limits. SCr down  2.25>>1.96. Plan to change to po abx soon, will continue IV vancomycin for now. Trough low at 6  Goal of Therapy:  Vancomycin trough level 10-15 mcg/ml  Plan:  Vancomycin 500mg  IV Q24 hours   F/U renal fxn, clinical course, VT as necessary  Trend WBC, Tmax  Plan for PO abx  Levester Fresh, PharmD, BCPS, [facility] Clinical Pharmacist Pager [phone] 05/05/2015 4:35 PM

## 2015-05-06 DIAGNOSIS — N178 Other acute kidney failure: Secondary | ICD-10-CM

## 2015-05-06 LAB — CARBOXYHEMOGLOBIN
Carboxyhemoglobin: 1.2 % (ref 0.5–1.5)
Methemoglobin: 0.9 % (ref 0.0–1.5)
O2 SAT: 62.5 %
TOTAL HEMOGLOBIN: 11.2 g/dL — AB (ref 12.0–16.0)

## 2015-05-06 LAB — BASIC METABOLIC PANEL
Anion gap: 8 (ref 5–15)
BUN: 61 mg/dL — AB (ref 6–20)
CALCIUM: 8.1 mg/dL — AB (ref 8.9–10.3)
CO2: 33 mmol/L — ABNORMAL HIGH (ref 22–32)
CREATININE: 1.89 mg/dL — AB (ref 0.44–1.00)
Chloride: 97 mmol/L — ABNORMAL LOW (ref 101–111)
GFR, EST AFRICAN AMERICAN: 26 mL/min — AB (ref >60)
GFR, EST NON AFRICAN AMERICAN: 22 mL/min — AB (ref >60)
Glucose, Bld: 116 mg/dL — ABNORMAL HIGH (ref 65–99)
Potassium: 4.2 mmol/L (ref 3.5–5.1)
SODIUM: 138 mmol/L (ref 135–145)

## 2015-05-06 MED ORDER — METOLAZONE 2.5 MG PO TABS
2.5000 mg | ORAL_TABLET | Freq: Once | ORAL | Status: AC
  Administered 2015-05-06: 2.5 mg via ORAL
  Filled 2015-05-06: qty 1

## 2015-05-06 NOTE — Progress Notes (Signed)
Advanced Heart Failure Rounding Note   Subjective:    Admitted from HF clinic with cellulitis and volume overload. Started on milrinone and vancomycin.  Milrinone stopped yesterday. Feels good. No dyspnea or orthopnea. Ankle pain improved with prednisone.   Creatinine stable at 1.9. Weight stable 108.   Todays CO-OX 63% CVP 12-13  Renal artery u/s: Findings suggest 1-59% renal artery stenosis bilaterally. There is evidence of elevated resistive indices of bilateral intrarenal arteries. There is evidence of elevated superior mesenteric artery velocities, suggestive of stenosis >70%.  Objective:   Weight Range:  Vital Signs:   Temp:  [97.2 F (36.2 C)-97.7 F (36.5 C)] 97.5 F (36.4 C) (12/04 1128) Pulse Rate:  [76-91] 91 (12/04 1128) Resp:  [15-23] 16 (12/04 1128) BP: (97-111)/(58-69) 109/69 mmHg (12/04 1128) SpO2:  [97 %-100 %] 98 % (12/04 1128) Weight:  [49.2 kg (108 lb 7.5 oz)] 49.2 kg (108 lb 7.5 oz) (12/04 0256) Last BM Date: 05/01/15  Weight change: Filed Weights   05/04/15 0356 05/05/15 0100 05/06/15 0256  Weight: 53.116 kg (117 lb 1.6 oz) 49.397 kg (108 lb 14.4 oz) 49.2 kg (108 lb 7.5 oz)    Intake/Output:   Intake/Output Summary (Last 24 hours) at 05/06/15 1250 Last data filed at 05/06/15 0900  Gross per 24 hour  Intake  788.1 ml  Output   1475 ml  Net -686.9 ml     Physical Exam: CVP ~12 General:  Elderly woman. No resp difficulty. In the chair.   HEENT: normal Neck: supple. JVP jaw. Carotids 2+ bilat; no bruits. No lymphadenopathy or thryomegaly appreciated. Cor: PMI laterally displaced. Regular rate & rhythm. 2/6 AS and 2/6 MR Lungs: clear Abdomen: soft, nontender, nondistended. No hepatosplenomegaly. No bruits or masses. Good bowel sounds. Extremities: no cyanosis, clubbing, rash,  R and LLE trace to tr edema. RLE wrap. RUE PICC   Neuro: alert & orientedx3, cranial nerves grossly intact. moves all 4 extremities w/o difficulty. Affect  pleasant  Telemetry: NSR   Labs: Basic Metabolic Panel:  Recent Labs Lab 05/02/15 0330 05/03/15 0516 05/04/15 0430 05/04/15 1120 05/05/15 0547 05/06/15 0516  NA 139 139 141  --  141 138  K 3.7 4.2 4.3  --  4.5 4.2  CL 99* 98* 101  --  101 97*  CO2 33* 34* 34*  --  33* 33*  GLUCOSE 118* 120* 129*  --  132* 116*  BUN 95* 82* 74*  --  68* 61*  CREATININE 2.06* 2.02* 1.90*  --  1.96* 1.89*  CALCIUM 8.1* 8.2* 8.0*  --  8.2* 8.1*  MG  --  2.6*  --  2.5*  --   --     Liver Function Tests: No results for input(s): AST, ALT, ALKPHOS, BILITOT, PROT, ALBUMIN in the last 168 hours. No results for input(s): LIPASE, AMYLASE in the last 168 hours. No results for input(s): AMMONIA in the last 168 hours.  CBC:  Recent Labs Lab 05/01/15 1825 05/02/15 0330  WBC 7.3 7.7  HGB 11.3* 10.5*  HCT 34.8* 32.1*  MCV 95.9 95.5  PLT 219 202    Cardiac Enzymes: No results for input(s): CKTOTAL, CKMB, CKMBINDEX, TROPONINI in the last 168 hours.  BNP: BNP (last 3 results)  Recent Labs  06/15/14 1421  BNP 3172.6*    ProBNP (last 3 results) No results for input(s): PROBNP in the last 8760 hours.    Other results:  Imaging: No results found.   Medications:  Scheduled Medications: . apixaban  2.5 mg Oral BID  . cholecalciferol  400 Units Oral Daily  . [START ON 05/22/2015] cyanocobalamin  1,000 mcg Intramuscular Q30 days  . furosemide  80 mg Intravenous BID  . metolazone  2.5 mg Oral Once  . mupirocin cream   Topical Daily  . oxybutynin  10 mg Oral q morning - 10a  . pantoprazole  40 mg Oral Daily  . potassium chloride SA  20 mEq Oral BID  . predniSONE  40 mg Oral Q breakfast  . simvastatin  20 mg Oral q1800  . sodium chloride  10-40 mL Intracatheter Q12H  . sodium chloride  3 mL Intravenous Q12H  . vancomycin  500 mg Intravenous Q24H    Infusions:    PRN Medications: sodium chloride, acetaminophen, calcium carbonate, ondansetron (ZOFRAN) IV, oxyCODONE, sodium  chloride, sodium chloride, traMADol   Assessment:   1. A/C Systolic Heart Failure due to ischemic CM  EF 20% 2. A/C Renal Failure, CKD 4 with cardiorenal disease    -Renal Ultrasound with medicorenal disease and mild to moderate R renal atrophy 3. Cellulitis 4. PAF 5. CAD H/O late-presenting anterior STEMI 03/2014  6. PSVT 7. Acute gout in R ankle  Plan/Discussion:    Overall improved. Now off milrinone. Renal function stable but volume status creeping back up. Will continue IV lasix and give one dose metolazone. Will try to switch to po diuretics tomorrow. Watch co-ox and CVP closely. Discussed potential need for home milrinone but PSVT complicates matters. If needs milrinone will likely also need amio. Hopefully we can avoid home inotropes.   Cellulitis  improving. Continue vanc for now - can switch to Keflex soon. Continue PT. She will likelly need SNF. WOC recommendations appreciated. Continue topical wound care. Gout improved with prednisone. Will give 3 day course.   Renal Ultrasound with medicorenal disease and mild to moderate R renal atrophy. No hydronephrosis.  Renal artery u/s without high-grade RAS.  Has PAF. Continue eliquis, reduced dose -age, weight, and renal function.   Need to readdress code status prior to d/c.   Length of Stay: 5  Tomio Kirk MD  05/06/2015, 12:50 PM  Advanced Heart Failure Team Pager [phone] (M-F; [place])  Please contact [facility] for night-coverage after hours (4p -7a ) and weekends on [email or web address]

## 2015-05-07 ENCOUNTER — Telehealth (HOSPITAL_COMMUNITY): Payer: Self-pay | Admitting: Pharmacist

## 2015-05-07 DIAGNOSIS — L03115 Cellulitis of right lower limb: Secondary | ICD-10-CM

## 2015-05-07 LAB — CBC
HCT: 35.2 % — ABNORMAL LOW (ref 36.0–46.0)
Hemoglobin: 11.2 g/dL — ABNORMAL LOW (ref 12.0–15.0)
MCH: 30.9 pg (ref 26.0–34.0)
MCHC: 31.8 g/dL (ref 30.0–36.0)
MCV: 97.2 fL (ref 78.0–100.0)
PLATELETS: 249 10*3/uL (ref 150–400)
RBC: 3.62 MIL/uL — AB (ref 3.87–5.11)
RDW: 15 % (ref 11.5–15.5)
WBC: 7.2 10*3/uL (ref 4.0–10.5)

## 2015-05-07 LAB — CARBOXYHEMOGLOBIN
CARBOXYHEMOGLOBIN: 1 % (ref 0.5–1.5)
Methemoglobin: 0.9 % (ref 0.0–1.5)
O2 SAT: 52.2 %
Total hemoglobin: 10.9 g/dL — ABNORMAL LOW (ref 12.0–16.0)

## 2015-05-07 LAB — BASIC METABOLIC PANEL
ANION GAP: 11 (ref 5–15)
BUN: 71 mg/dL — AB (ref 6–20)
CALCIUM: 8.3 mg/dL — AB (ref 8.9–10.3)
CO2: 30 mmol/L (ref 22–32)
Chloride: 96 mmol/L — ABNORMAL LOW (ref 101–111)
Creatinine, Ser: 2.72 mg/dL — ABNORMAL HIGH (ref 0.44–1.00)
GFR calc Af Amer: 17 mL/min — ABNORMAL LOW (ref >60)
GFR, EST NON AFRICAN AMERICAN: 14 mL/min — AB (ref >60)
Glucose, Bld: 191 mg/dL — ABNORMAL HIGH (ref 65–99)
POTASSIUM: 5.1 mmol/L (ref 3.5–5.1)
SODIUM: 137 mmol/L (ref 135–145)

## 2015-05-07 MED ORDER — AMIODARONE HCL 200 MG PO TABS
200.0000 mg | ORAL_TABLET | Freq: Two times a day (BID) | ORAL | Status: DC
  Administered 2015-05-07 – 2015-05-11 (×9): 200 mg via ORAL
  Filled 2015-05-07 (×9): qty 1

## 2015-05-07 MED ORDER — FUROSEMIDE 10 MG/ML IJ SOLN
120.0000 mg | Freq: Two times a day (BID) | INTRAVENOUS | Status: DC
  Administered 2015-05-07 – 2015-05-08 (×3): 120 mg via INTRAVENOUS
  Filled 2015-05-07 (×5): qty 12

## 2015-05-07 MED ORDER — POTASSIUM CHLORIDE CRYS ER 20 MEQ PO TBCR: 20.0000 meq | EXTENDED_RELEASE_TABLET | Freq: Every day | ORAL | Status: DC

## 2015-05-07 MED ORDER — CEPHALEXIN 250 MG PO CAPS
250.0000 mg | ORAL_CAPSULE | Freq: Three times a day (TID) | ORAL | Status: DC
  Administered 2015-05-07 – 2015-05-11 (×13): 250 mg via ORAL
  Filled 2015-05-07 (×15): qty 1

## 2015-05-07 MED ORDER — FUROSEMIDE 10 MG/ML IJ SOLN
40.0000 mg | Freq: Once | INTRAMUSCULAR | Status: AC
  Administered 2015-05-07: 40 mg via INTRAVENOUS
  Filled 2015-05-07: qty 4

## 2015-05-07 MED ORDER — CEPHALEXIN 250 MG PO CAPS
250.0000 mg | ORAL_CAPSULE | Freq: Three times a day (TID) | ORAL | Status: DC
  Filled 2015-05-07 (×4): qty 1

## 2015-05-07 MED ORDER — METOLAZONE 2.5 MG PO TABS
2.5000 mg | ORAL_TABLET | Freq: Once | ORAL | Status: AC
Start: 2015-05-07 — End: 2015-05-07
  Administered 2015-05-07: 2.5 mg via ORAL
  Filled 2015-05-07: qty 1

## 2015-05-07 MED ORDER — MILRINONE IN DEXTROSE 20 MG/100ML IV SOLN
0.1250 ug/kg/min | INTRAVENOUS | Status: DC
  Administered 2015-05-07 – 2015-05-09 (×2): 0.125 ug/kg/min via INTRAVENOUS
  Filled 2015-05-07: qty 100

## 2015-05-07 NOTE — Clinical Social Work Note (Signed)
Covering CSW received referral for SNF placement pending PT and OT recommendations.  CSW was informed by [facility] that patient will need a SNF that can give milrinone drip if patient does discharge to SNF.  CSW to continue to follow patient's progress pending PT and OT recommendations.  Jones Broom. Pickrell, MSW, [place] 05/07/2015 7:13 PM

## 2015-05-07 NOTE — Telephone Encounter (Signed)
Eliquis patient assistance approved through [employer] until 06/02/2015. [employer] will send 3 month supply to patient's home. May need to reapply next year when in donut hole again.   [name]. [name], PharmD, BCPS, CPP Clinical Pharmacist Pager: [phone] Phone: [phone] 05/07/2015 1:46 PM

## 2015-05-07 NOTE — Care Management Important Message (Signed)
Important Message  Patient Details  Name: Leah Kramer MRN: [id number] Date of Birth: 12/23/1923   Medicare Important Message Given:  Yes    Kaitrin Seybold P Mesiah Manzo 05/07/2015, 11:38 AM

## 2015-05-07 NOTE — Progress Notes (Signed)
Advanced Heart Failure Rounding Note   Subjective:    Admitted from HF clinic with cellulitis and volume overload. Started on milrinone and vancomycin.  Yesterday she continued to diurese with IV lasix. Weight up 1 pound. Episode of dyspnea last night. Complaining of difficulty walking.   Todays CO-OX 52% CVP 14  Renal artery u/s: Findings suggest 1-59% renal artery stenosis bilaterally. There is evidence of elevated resistive indices of bilateral intrarenal arteries. There is evidence of elevated superior mesenteric artery velocities, suggestive of stenosis >70%.  Objective:   Weight Range:  Vital Signs:   Temp:  [97.4 F (36.3 C)-97.9 F (36.6 C)] 97.4 F (36.3 C) (12/05 0710) Pulse Rate:  [84-91] 87 (12/05 0710) Resp:  [15-18] 18 (12/05 0710) BP: (102-109)/(51-76) 104/70 mmHg (12/05 0710) SpO2:  [97 %-100 %] 100 % (12/05 0710) Weight:  [109 lb 9.1 oz (49.7 kg)] 109 lb 9.1 oz (49.7 kg) (12/05 0401) Last BM Date: 05/01/15  Weight change: Filed Weights   05/05/15 0100 05/06/15 0256 05/07/15 0401  Weight: 108 lb 14.4 oz (49.397 kg) 108 lb 7.5 oz (49.2 kg) 109 lb 9.1 oz (49.7 kg)    Intake/Output:   Intake/Output Summary (Last 24 hours) at 05/07/15 0831 Last data filed at 05/07/15 0000  Gross per 24 hour  Intake    400 ml  Output    725 ml  Net   -325 ml     Physical Exam: CVP ~14 General:  Elderly woman. No resp difficulty. In the bed.   HEENT: normal Neck: supple. JVP jaw. Carotids 2+ bilat; no bruits. No lymphadenopathy or thryomegaly appreciated. Cor: PMI laterally displaced. Regular rate & rhythm. 2/6 AS and 2/6 MR Lungs: clear Abdomen: soft, nontender, nondistended. No hepatosplenomegaly. No bruits or masses. Good bowel sounds. Extremities: no cyanosis, clubbing, rash,  R and LLE trace to tr edema. RLE pale pink with dry scales.RUE PICC   Neuro: alert & orientedx3, cranial nerves grossly intact. moves all 4 extremities w/o difficulty. Affect  pleasant  Telemetry: NSR   Labs: Basic Metabolic Panel:  Recent Labs Lab 05/02/15 0330 05/03/15 0516 05/04/15 0430 05/04/15 1120 05/05/15 0547 05/06/15 0516  NA 139 139 141  --  141 138  K 3.7 4.2 4.3  --  4.5 4.2  CL 99* 98* 101  --  101 97*  CO2 33* 34* 34*  --  33* 33*  GLUCOSE 118* 120* 129*  --  132* 116*  BUN 95* 82* 74*  --  68* 61*  CREATININE 2.06* 2.02* 1.90*  --  1.96* 1.89*  CALCIUM 8.1* 8.2* 8.0*  --  8.2* 8.1*  MG  --  2.6*  --  2.5*  --   --     Liver Function Tests: No results for input(s): AST, ALT, ALKPHOS, BILITOT, PROT, ALBUMIN in the last 168 hours. No results for input(s): LIPASE, AMYLASE in the last 168 hours. No results for input(s): AMMONIA in the last 168 hours.  CBC:  Recent Labs Lab 05/01/15 1825 05/02/15 0330 05/07/15 0410  WBC 7.3 7.7 7.2  HGB 11.3* 10.5* 11.2*  HCT 34.8* 32.1* 35.2*  MCV 95.9 95.5 97.2  PLT 219 202 249    Cardiac Enzymes: No results for input(s): CKTOTAL, CKMB, CKMBINDEX, TROPONINI in the last 168 hours.  BNP: BNP (last 3 results)  Recent Labs  06/15/14 1421  BNP 3172.6*    ProBNP (last 3 results) No results for input(s): PROBNP in the last 8760 hours.    Other  results:  Imaging: No results found.   Medications:     Scheduled Medications: . apixaban  2.5 mg Oral BID  . cholecalciferol  400 Units Oral Daily  . [START ON 05/22/2015] cyanocobalamin  1,000 mcg Intramuscular Q30 days  . furosemide  80 mg Intravenous BID  . mupirocin cream   Topical Daily  . oxybutynin  10 mg Oral q morning - 10a  . pantoprazole  40 mg Oral Daily  . potassium chloride SA  20 mEq Oral BID  . predniSONE  40 mg Oral Q breakfast  . simvastatin  20 mg Oral q1800  . sodium chloride  10-40 mL Intracatheter Q12H  . sodium chloride  3 mL Intravenous Q12H  . vancomycin  500 mg Intravenous Q24H    Infusions:    PRN Medications: sodium chloride, acetaminophen, calcium carbonate, ondansetron (ZOFRAN) IV, oxyCODONE,  sodium chloride, sodium chloride, traMADol   Assessment:   1. A/C Systolic Heart Failure due to ischemic CM  EF 20% 2. A/C Renal Failure, CKD 4 with cardiorenal disease    -Renal Ultrasound with medicorenal disease and mild to moderate R renal atrophy 3. Cellulitis 4. PAF 5. CAD H/O late-presenting anterior STEMI 03/2014  6. PSVT 7. Acute gout in R ankle  Plan/Discussion:   Volume status trending up. Continue IV lasix. CO-OX 52%. May need to restart milrinone but will follow up and discuss with family. No bb with low output. Hold off on hydralazine/imdur for now.   Cellulitis much improved.  Stop vanc and start keflex. Continue xeroform and mild compression.   Urine Acid 12.0  Gout improved with prednisone.   Renal Ultrasound with medicorenal disease and mild to moderate R renal atrophy. No hydronephrosis.  Renal artery u/s without high-grade RAS.  Has PAF. Continue eliquis, reduced dose -age, weight, and renal function.   PT following for deconditioning. Will need SNF versus rehab.   Need to readdress code status prior to d/c.   Length of Stay: [place] NP-C  05/07/2015, 8:31 AM  Advanced Heart Failure Team Pager [phone] (M-F; 7a - 4p)  Please contact [facility] for night-coverage after hours (4p -7a ) and weekends on [email or web address]   Patient seen and examined with Darrick Grinder, NP. We discussed all aspects of the encounter. I agree with the assessment and plan as stated above.   Co-ox down and volume status worse off milrinone. Cellulitis much improved. Long talk with patient and her family about the following issues 1) Consideration of home inotropes 2) Need for possible rehab stay 3) Code status  They would like to see if we can manage her off inotropes for now. Thus will increase IV lasix and metolazone and see how she does. If needs SNF and milrinone they would be interest in [facility] facility. I spoke with Dr. Garlon Hatchet who is medical director and  they would take her with milrinone. We will have clearer picture of this in a few days. Have not decided on code status yet.   Jamari Moten,MD 12:18 PM

## 2015-05-08 LAB — BASIC METABOLIC PANEL
ANION GAP: 7 (ref 5–15)
BUN: 77 mg/dL — ABNORMAL HIGH (ref 6–20)
CHLORIDE: 95 mmol/L — AB (ref 101–111)
CO2: 31 mmol/L (ref 22–32)
Calcium: 8.1 mg/dL — ABNORMAL LOW (ref 8.9–10.3)
Creatinine, Ser: 2.9 mg/dL — ABNORMAL HIGH (ref 0.44–1.00)
GFR calc Af Amer: 16 mL/min — ABNORMAL LOW (ref >60)
GFR calc non Af Amer: 13 mL/min — ABNORMAL LOW (ref >60)
GLUCOSE: 135 mg/dL — AB (ref 65–99)
POTASSIUM: 5.1 mmol/L (ref 3.5–5.1)
Sodium: 133 mmol/L — ABNORMAL LOW (ref 135–145)

## 2015-05-08 LAB — CARBOXYHEMOGLOBIN
Carboxyhemoglobin: 1 % (ref 0.5–1.5)
Methemoglobin: 0.7 % (ref 0.0–1.5)
O2 Saturation: 69.4 %
Total hemoglobin: 10.4 g/dL — ABNORMAL LOW (ref 12.0–16.0)

## 2015-05-08 MED ORDER — ALLOPURINOL 100 MG PO TABS
100.0000 mg | ORAL_TABLET | Freq: Every day | ORAL | Status: DC
  Administered 2015-05-08 – 2015-05-11 (×4): 100 mg via ORAL
  Filled 2015-05-08 (×4): qty 1

## 2015-05-08 NOTE — Evaluation (Signed)
Physical Therapy Evaluation Patient Details Name: Leah Kramer MRN: [id number] DOB: December 30, 1923 Today's Date: 05/08/2015   History of Present Illness  81 year old female with history of heart failure, chronic kidney disease, hypertension, diabetes, CAD presenting with volume overload and cellulitis to R LE.  Clinical Impression  Pt admitted with above presenting with generalized deconditioning, decreased activity tolerance, R LE pain limiting ambulation tolerance, and desaturation during mobility. (see vital sections). Pt with good home set up and support. Pt was functioning indep PTA but now requires minA and RW for safe mobility. Pt to benefit from HHPT to improved mentioned deficits.    Follow Up Recommendations Home health PT;Supervision/Assistance - 24 hour    Equipment Recommendations  None recommended by PT    Recommendations for Other Services       Precautions / Restrictions Precautions Precautions: Fall Restrictions Weight Bearing Restrictions: No (pt self R LE WBAT)   SATURATION QUALIFICATIONS: (This note is used to comply with regulatory documentation for home oxygen)  Patient Saturations on Room Air at Rest =92%  Patient Saturations on Room Air while Ambulating =83%  Patient Saturations on 3 Liters of oxygen while Ambulating = 88-92%  Please briefly explain why patient needs home oxygen: 83% on RA     Mobility  Bed Mobility               General bed mobility comments: pt up in chair upon PT arrival  Transfers Overall transfer level: Needs assistance Equipment used: Rolling walker (2 wheeled) Transfers: Sit to/from Stand Sit to Stand: Min assist;+2 safety/equipment         General transfer comment: due to low surface height pt required assist to initially clear bottom and achieve full upright position  Ambulation/Gait Ambulation/Gait assistance: Min assist Ambulation Distance (Feet): 50 Feet Assistive device: Rolling walker (2 wheeled) Gait  Pattern/deviations: Step-through pattern;Decreased stride length;Antalgic Gait velocity: slow   General Gait Details: pt with R LE limp, 3 standing rest breaks. SpO2 at 83% on RA, 88% on 3LO2 via   Stairs            Wheelchair Mobility    Modified Rankin (Stroke Patients Only)       Balance Overall balance assessment: Needs assistance Sitting-balance support: Feet supported;No upper extremity supported Sitting balance-Leahy Scale: Fair     Standing balance support: Single extremity supported Standing balance-Leahy Scale: Fair                               Pertinent Vitals/Pain Pain Assessment: 0-10 Pain Score: 3  Pain Location: R foot Pain Descriptors / Indicators: Constant    Home Living Family/patient expects to be discharged to:: Private residence Living Arrangements: Spouse/significant other Available Help at Discharge: Family;Available 24 hours/day Type of Home: House Home Access: Stairs to enter Entrance Stairs-Rails: Right Entrance Stairs-Number of Steps: 3-7 Home Layout: Two level;Able to live on main level with bedroom/bathroom Home Equipment: Gilford Rile - 2 wheels;Walker - 4 wheels;Shower seat;Bedside commode      Prior Function Level of Independence: Needs assistance   Gait / Transfers Assistance Needed: pt was ambulating without AD, occasional use of RW for longer distances  ADL's / Homemaking Assistance Needed: pt and spouse would help each other in/out of the shower        Hand Dominance   Dominant Hand: Right    Extremity/Trunk Assessment   Upper Extremity Assessment: Generalized weakness  Lower Extremity Assessment: Generalized weakness;RLE deficits/detail RLE Deficits / Details: unable to complete LAQ, ankle ROM limited by pain    Cervical / Trunk Assessment: Kyphotic  Communication   Communication: HOH  Cognition Arousal/Alertness: Awake/alert Behavior During Therapy: WFL for tasks  assessed/performed Overall Cognitive Status: Within Functional Limits for tasks assessed                      General Comments      Exercises        Assessment/Plan    PT Assessment Patient needs continued PT services  PT Diagnosis Difficulty walking;Generalized weakness;Acute pain   PT Problem List Decreased strength;Decreased activity tolerance;Decreased balance;Decreased mobility;Decreased range of motion  PT Treatment Interventions DME instruction;Gait training;Stair training;Functional mobility training;Therapeutic activities;Therapeutic exercise;Balance training   PT Goals (Current goals can be found in the Care Plan section) Acute Rehab PT Goals Patient Stated Goal: home PT Goal Formulation: With patient Time For Goal Achievement: 05/22/15 Potential to Achieve Goals: Good    Frequency Min 3X/week   Barriers to discharge        Co-evaluation               End of Session Equipment Utilized During Treatment: Gait belt;Oxygen (3LO2 via [place]) Activity Tolerance: Patient limited by fatigue Patient left:  (in bathroom with OT) Nurse Communication: Mobility status (SPO2 stats)         Time: [id number] PT Time Calculation (min) (ACUTE ONLY): 22 min   Charges:   PT Evaluation $Initial PT Evaluation Tier I: 1 Procedure     PT G CodesKingsley Callander 05/08/2015, 9:59 AM   Kittie Plater, PT, DPT Pager #: [phone] Office #: [phone]

## 2015-05-08 NOTE — Progress Notes (Signed)
Advanced Heart Failure Rounding Note   Subjective:    Admitted from HF clinic with cellulitis and volume overload. Started on milrinone and vancomycin.  Yesterday failed milrinone wean. CO-OX down to 52%. Milrinone restarted 0.125 mcg. Also IV lasix increased.  Weight up but was weighed in bed. CVP down a little 11-12.   Able to sleep all night. Denies SOB. Ongoing difficulty walking.    Todays CO-OX 69%CVP 11-12   Renal artery u/s: Findings suggest 1-59% renal artery stenosis bilaterally. There is evidence of elevated resistive indices of bilateral intrarenal arteries. There is evidence of elevated superior mesenteric artery velocities, suggestive of stenosis >70%.  Objective:   Weight Range:  Vital Signs:   Temp:  [97.3 F (36.3 C)-97.5 F (36.4 C)] 97.5 F (36.4 C) (12/06 0506) Pulse Rate:  [71-91] 78 (12/06 0600) Resp:  [16-18] 16 (12/05 1600) BP: (94-105)/(52-77) 105/61 mmHg (12/06 0600) SpO2:  [90 %-100 %] 98 % (12/06 0600) Weight:  [116 lb 6.4 oz (52.799 kg)] 116 lb 6.4 oz (52.799 kg) (12/06 0506) Last BM Date: 05/07/15  Weight change: Filed Weights   05/06/15 0256 05/07/15 0401 05/08/15 0506  Weight: 108 lb 7.5 oz (49.2 kg) 109 lb 9.1 oz (49.7 kg) 116 lb 6.4 oz (52.799 kg)    Intake/Output:   Intake/Output Summary (Last 24 hours) at 05/08/15 0708 Last data filed at 05/08/15 [id number]  Gross per 24 hour  Intake 673.19 ml  Output   1000 ml  Net -326.81 ml     Physical Exam: CVP ~11-12  General:  Elderly woman. No resp difficulty. In the bed.   HEENT: normal Neck: supple. JVP jaw. Carotids 2+ bilat; no bruits. No lymphadenopathy or thryomegaly appreciated. Cor: PMI laterally displaced. Regular rate & rhythm. 2/6 AS and 2/6 MR Lungs: clear Abdomen: soft, nontender, nondistended. No hepatosplenomegaly. No bruits or masses. Good bowel sounds. Extremities: no cyanosis, clubbing, rash,  R and LLE trace to tr edema. .RUE PICC   RLE cellulitis improving still  with mild skin breakdown.  Neuro: alert & orientedx3, cranial nerves grossly intact. moves all 4 extremities w/o difficulty. Affect pleasant  Telemetry: NSR   Labs: Basic Metabolic Panel:  Recent Labs Lab 05/03/15 0516 05/04/15 0430 05/04/15 1120 05/05/15 0547 05/06/15 0516 05/07/15 1045  NA 139 141  --  141 138 137  K 4.2 4.3  --  4.5 4.2 5.1  CL 98* 101  --  101 97* 96*  CO2 34* 34*  --  33* 33* 30  GLUCOSE 120* 129*  --  132* 116* 191*  BUN 82* 74*  --  68* 61* 71*  CREATININE 2.02* 1.90*  --  1.96* 1.89* 2.72*  CALCIUM 8.2* 8.0*  --  8.2* 8.1* 8.3*  MG 2.6*  --  2.5*  --   --   --     Liver Function Tests: No results for input(s): AST, ALT, ALKPHOS, BILITOT, PROT, ALBUMIN in the last 168 hours. No results for input(s): LIPASE, AMYLASE in the last 168 hours. No results for input(s): AMMONIA in the last 168 hours.  CBC:  Recent Labs Lab 05/01/15 1825 05/02/15 0330 05/07/15 0410  WBC 7.3 7.7 7.2  HGB 11.3* 10.5* 11.2*  HCT 34.8* 32.1* 35.2*  MCV 95.9 95.5 97.2  PLT 219 202 249    Cardiac Enzymes: No results for input(s): CKTOTAL, CKMB, CKMBINDEX, TROPONINI in the last 168 hours.  BNP: BNP (last 3 results)  Recent Labs  06/15/14 1421  BNP 3172.6*  ProBNP (last 3 results) No results for input(s): PROBNP in the last 8760 hours.    Other results:  Imaging: No results found.   Medications:     Scheduled Medications: . amiodarone  200 mg Oral BID  . apixaban  2.5 mg Oral BID  . cephALEXin  250 mg Oral 3 times per day  . cholecalciferol  400 Units Oral Daily  . [START ON 05/22/2015] cyanocobalamin  1,000 mcg Intramuscular Q30 days  . furosemide  120 mg Intravenous BID  . mupirocin cream   Topical Daily  . oxybutynin  10 mg Oral q morning - 10a  . pantoprazole  40 mg Oral Daily  . predniSONE  40 mg Oral Q breakfast  . simvastatin  20 mg Oral q1800  . sodium chloride  10-40 mL Intracatheter Q12H  . sodium chloride  3 mL Intravenous Q12H      Infusions: . milrinone 0.125 mcg/kg/min (05/07/15 1335)    PRN Medications: sodium chloride, acetaminophen, calcium carbonate, ondansetron (ZOFRAN) IV, oxyCODONE, sodium chloride, sodium chloride, traMADol   Assessment:   1. A/C Systolic Heart Failure due to ischemic CM  EF 20% 2. A/C Renal Failure, CKD 4 with cardiorenal disease    -Renal Ultrasound with medicorenal disease and mild to moderate R renal atrophy 3. Cellulitis 4. PAF 5. CAD H/O late-presenting anterior STEMI 03/2014  6. PSVT 7. Acute gout in R ankle  Plan/Discussion:   Failed milrinone wean. CO-OX dropped to 52% + worsening renal function. Milrinone restarted at 0.125 mcg. Todays CO-OX is 69%.  CVP ~12  Continue lasix at higher dose. Can adjust if based on renal funciton.  No bb with low output. Hold off on hydralazine/imdur for now.   Cellulitis much improved.  Stop vanc and start keflex. Continue xeroform and mild compression.   Urine Acid 12.0  Gout improved with prednisone. Completing 3 days.   Renal Ultrasound with medicorenal disease and mild to moderate R renal atrophy. No hydronephrosis.  Renal artery u/s without high-grade RAS.  Has PAF. Continue eliquis, reduced dose -age, weight, and renal function.   PT/OT consult pending. Will need SNF versus rehab.   Need to readdress code status prior to d/c.   Length of Stay: [place] NP-C  05/08/2015, 7:08 AM  Advanced Heart Failure Team Pager [phone] (M-F; 7a - 4p)  Please contact [facility] for night-coverage after hours (4p -7a ) and weekends on [email or web address]  Patient seen and examined with Darrick Grinder, NP. We discussed all aspects of the encounter. I agree with the assessment and plan as stated above.   Milrinone restarted yesterday. Co-ox improved. Beginning to diurese. Will continue higher dose IV lasix. Await BMET.   Will need home inotropes. Once stable will likely go to [facility]. (I spoke with Dr. Garlon Hatchet who said they  would accept milrinone. Leg wound improved. Discussing code status with patient and family. Continue prednisone with gout. Add allopurinol 100 daily.   Lilliane Sposito,MD 8:13 AM

## 2015-05-08 NOTE — Evaluation (Signed)
Occupational Therapy Evaluation Patient Details Name: Leah Kramer MRN: [id number] DOB: 1923/09/28 Today's Date: 05/08/2015    History of Present Illness 81 year old female with history of heart failure, chronic kidney disease, hypertension, diabetes, CAD presenting with volume overload and cellulitis to R LE.   Clinical Impression   PT admitted with volume overload and cellulitis R LE. Pt currently with functional limitiations due to the deficits listed below (see OT problem list). PTA living at home with spouse independent. Pt will benefit from skilled OT to increase their independence and safety with adls and balance to allow discharge HHOT. ** Note pt needed o2 this session due to desaturation.      Follow Up Recommendations  Home health OT;Supervision - Intermittent    Equipment Recommendations  3 in 1 bedside comode (has 3n1 at home already but recommending it be used)    Recommendations for Other Services       Precautions / Restrictions Precautions Precautions: Fall      Mobility Bed Mobility               General bed mobility comments: up in chair on arrival  Transfers Overall transfer level: Needs assistance Equipment used: Rolling walker (2 wheeled) Transfers: Sit to/from Stand Sit to Stand: Min assist         General transfer comment: cues for hand placement for safety    Balance Overall balance assessment: Needs assistance         Standing balance support: Single extremity supported Standing balance-Leahy Scale: Fair                              ADL Overall ADL's : Needs assistance/impaired     Grooming: Oral care;Set up;Sitting                   Toilet Transfer: Minimal assistance;Ambulation;Grab bars;RW;Regular Glass blower/designer Details (indicate cue type and reason): cues for hand placement Toileting- Clothing Manipulation and Hygiene: Supervision/safety;Sitting/lateral lean       Functional mobility  during ADLs: Moderate assistance General ADL Comments: Pt with noticeable limb due to pain in foot and use of RW required. pt has extended family that can (A) at d/c home. Pt with DOE currently with need for oxgyen. pt with need further education on tube management if to d/c with O2.     Vision Vision Assessment?: No apparent visual deficits   Perception     Praxis      Pertinent Vitals/Pain       Hand Dominance Right   Extremity/Trunk Assessment Upper Extremity Assessment Upper Extremity Assessment: Generalized weakness   Lower Extremity Assessment Lower Extremity Assessment: Defer to PT evaluation RLE Deficits / Details: noticeable limb with RW use   Cervical / Trunk Assessment Cervical / Trunk Assessment: Kyphotic   Communication Communication Communication: HOH   Cognition Arousal/Alertness: Awake/alert Behavior During Therapy: WFL for tasks assessed/performed Overall Cognitive Status: Within Functional Limits for tasks assessed                     General Comments       Exercises       Shoulder Instructions      Home Living Family/patient expects to be discharged to:: Private residence Living Arrangements: Spouse/significant other Available Help at Discharge: Family;Available 24 hours/day Type of Home: House Home Access: Stairs to enter [employer] of Steps: 3-7 Entrance Stairs-Rails: Right Home Layout: Two level;Able to  live on main level with bedroom/bathroom     Bathroom Shower/Tub: Teacher, early years/pre: Standard     Home Equipment: Environmental consultant - 2 wheels;Walker - 4 wheels;Shower seat;Bedside commode          Prior Functioning/Environment Level of Independence: Needs assistance  Gait / Transfers Assistance Needed: pt was ambulating without AD, occasional use of RW for longer distances ADL's / Homemaking Assistance Needed: pt and spouse would help each other in/out of the shower        OT Diagnosis: Generalized  weakness;Acute pain   OT Problem List: Decreased strength;Decreased activity tolerance;Impaired balance (sitting and/or standing);Decreased safety awareness;Decreased knowledge of use of DME or AE;Decreased knowledge of precautions;Cardiopulmonary status limiting activity;Pain   OT Treatment/Interventions: Self-care/ADL training;Therapeutic exercise;DME and/or AE instruction;Energy conservation;Therapeutic activities;Patient/family education;Balance training    OT Goals(Current goals can be found in the care plan section) Acute Rehab OT Goals Patient Stated Goal: home OT Goal Formulation: With patient/family Time For Goal Achievement: 05/22/15 Potential to Achieve Goals: Good  OT Frequency: Min 2X/week   Barriers to D/C:            Co-evaluation              End of Session Equipment Utilized During Treatment: Gait belt;Rolling walker;Oxygen Nurse Communication: Mobility status;Precautions  Activity Tolerance: Other (comment) (O2 desaturation) Patient left: in chair;with call bell/phone within reach;with family/visitor present   Time: [id number] OT Time Calculation (min): 42 min Charges:  OT General Charges $OT Visit: 1 Procedure OT Evaluation $Initial OT Evaluation Tier I: 1 Procedure OT Treatments $Self Care/Home Management : 8-22 mins G-Codes:    Peri Maris 05/29/2015, 2:15 PM    Jeri Modena   OTR/L Pager: [phone] Office: [phone] .

## 2015-05-09 ENCOUNTER — Ambulatory Visit: Payer: Medicare Other | Admitting: Podiatry

## 2015-05-09 LAB — BASIC METABOLIC PANEL
Anion gap: 11 (ref 5–15)
BUN: 80 mg/dL — AB (ref 6–20)
CALCIUM: 7.7 mg/dL — AB (ref 8.9–10.3)
CHLORIDE: 91 mmol/L — AB (ref 101–111)
CO2: 30 mmol/L (ref 22–32)
CREATININE: 2.89 mg/dL — AB (ref 0.44–1.00)
GFR, EST AFRICAN AMERICAN: 16 mL/min — AB (ref >60)
GFR, EST NON AFRICAN AMERICAN: 13 mL/min — AB (ref >60)
Glucose, Bld: 133 mg/dL — ABNORMAL HIGH (ref 65–99)
Potassium: 4.5 mmol/L (ref 3.5–5.1)
SODIUM: 132 mmol/L — AB (ref 135–145)

## 2015-05-09 LAB — CARBOXYHEMOGLOBIN
Carboxyhemoglobin: 1.2 % (ref 0.5–1.5)
Methemoglobin: 0.6 % (ref 0.0–1.5)
O2 Saturation: 57.4 %
Total hemoglobin: 9.9 g/dL — ABNORMAL LOW (ref 12.0–16.0)

## 2015-05-09 MED ORDER — TORSEMIDE 20 MG PO TABS: 60.0000 mg | ORAL_TABLET | Freq: Two times a day (BID) | ORAL | Status: DC

## 2015-05-09 MED ORDER — TORSEMIDE 20 MG PO TABS
60.0000 mg | ORAL_TABLET | Freq: Two times a day (BID) | ORAL | Status: DC
  Administered 2015-05-09 – 2015-05-11 (×5): 60 mg via ORAL
  Filled 2015-05-09 (×5): qty 3

## 2015-05-09 MED ORDER — CETYLPYRIDINIUM CHLORIDE 0.05 % MT LIQD
7.0000 mL | Freq: Two times a day (BID) | OROMUCOSAL | Status: DC
  Administered 2015-05-09 – 2015-05-11 (×4): 7 mL via OROMUCOSAL

## 2015-05-09 NOTE — Progress Notes (Signed)
Physical Therapy Treatment Patient Details Name: Leah Kramer MRN: [id number] DOB: 12-Apr-1924 Today's Date: 05/09/2015    History of Present Illness 81 year old female with history of heart failure, chronic kidney disease, hypertension, diabetes, CAD presenting with volume overload and cellulitis to R LE.    PT Comments    Pt with improved ambulation tolerance however con't to require min/modA for safe transfers and ADLs. Spouse is elderly and unable to provide more than supervision to patient. Pt to benefit from ST-SNF upon d/c to achieve safe supervision level of function for safe transition home with spouse.  Follow Up Recommendations  SNF     Equipment Recommendations  None recommended by PT    Recommendations for Other Services       Precautions / Restrictions Precautions Precautions: Fall Restrictions Weight Bearing Restrictions: No    Mobility  Bed Mobility               General bed mobility comments: up in chair  Transfers Overall transfer level: Needs assistance Equipment used: Rolling walker (2 wheeled) Transfers: Sit to/from Stand Sit to Stand: Mod assist         General transfer comment: cues for safe hand placement and pt with posterior lean. Ambulated 100 feet wtih min A with RW and on RA  Ambulation/Gait Ambulation/Gait assistance: Min assist Ambulation Distance (Feet): 125 Feet Assistive device: Rolling walker (2 wheeled) Gait Pattern/deviations: Step-through pattern;Decreased stride length;Antalgic Gait velocity: decreased   General Gait Details: pt with R LE limp, improved amb tolerance compared to yesterday   Stairs            Wheelchair Mobility    Modified Rankin (Stroke Patients Only)       Balance Overall balance assessment: Needs assistance         Standing balance support: Bilateral upper extremity supported Standing balance-Leahy Scale: Fair Standing balance comment: reliant on RW                     Cognition Arousal/Alertness: Awake/alert Behavior During Therapy: WFL for tasks assessed/performed Overall Cognitive Status: Within Functional Limits for tasks assessed                      Exercises      General Comments        Pertinent Vitals/Pain Pain Assessment: Faces Faces Pain Scale: Hurts a little bit Pain Location: R LE with WBing Pain Descriptors / Indicators: Aching    Home Living                      Prior Function            PT Goals (current goals can now be found in the care plan section) Acute Rehab PT Goals Patient Stated Goal: home Progress towards PT goals: Progressing toward goals    Frequency  Min 2X/week    PT Plan Discharge plan needs to be updated    Co-evaluation             End of Session Equipment Utilized During Treatment: Gait belt;Oxygen Activity Tolerance: Patient tolerated treatment well Patient left: in chair;with call bell/phone within reach     Time: 1451-1507 PT Time Calculation (min) (ACUTE ONLY): 16 min  Charges:  $Gait Training: 8-22 mins                    G Codes:      Kingsley Callander 05/09/2015, 3:38 PM  Kittie Plater, PT, DPT Pager #: [phone] Office #: [phone]

## 2015-05-09 NOTE — Progress Notes (Signed)
Occupational Therapy Treatment Patient Details Name: Pamel Woodward MRN: [id number] DOB: 1924-05-12 Today's Date: 05/09/2015    History of present illness 82 year old female with history of heart failure, chronic kidney disease, hypertension, diabetes, CAD presenting with volume overload and cellulitis to R LE.   OT comments  This 81 yo female not progressing today as far as sit<>stand and A needed for ambulation; however at the same time did ambulate farther today and not on O2. She will continue to benefit from acute OT with now follow up at SNF due to pt needs to be stronger and more independent in order to help herself more at home and not have to rely on her 37 yo husband.  Follow Up Recommendations  SNF    Equipment Recommendations   (TBD next venue)       Precautions / Restrictions Precautions Precautions: Fall Restrictions Weight Bearing Restrictions: No       Mobility Bed Mobility               General bed mobility comments: up in chair on arrival  Transfers Overall transfer level: Needs assistance Equipment used: Rolling walker (2 wheeled) Transfers: Sit to/from Stand Sit to Stand: Mod assist         General transfer comment: cues for safe hand placement and pt with posterior lean. Ambulated 100 feet wtih min A with RW and on RA    Balance Overall balance assessment: Needs assistance Sitting-balance support: Feet supported;No upper extremity supported Sitting balance-Leahy Scale: Fair     Standing balance support: Bilateral upper extremity supported Standing balance-Leahy Scale: Fair Standing balance comment: reliant on RW                   ADL Overall ADL's : Needs assistance/impaired                         Toilet Transfer:  (min A to<>from, Mod  A sit<>stand with posterior lean)   Toileting- Clothing Manipulation and Hygiene: Supervision/safety (with Mod A sit<>stand)                          Cognition   Behavior  During Therapy: WFL for tasks assessed/performed Overall Cognitive Status: Within Functional Limits for tasks assessed                                    Pertinent Vitals/ Pain       Pain Assessment: Faces Faces Pain Scale: Hurts a little bit Pain Location: RLE with ambulation Pain Descriptors / Indicators: Aching;Sore Pain Intervention(s): Repositioned;Monitored during session         Frequency Min 2X/week     Progress Toward Goals  OT Goals(current goals can now be found in the care plan section)  Progress towards OT goals: Not progressing toward goals - comment (more issues with sit<>stand today and remains at min A for ambulation)     Plan Discharge plan needs to be updated       End of Session Equipment Utilized During Treatment: Gait belt;Rolling walker (O2 tank with [place], but did not use)   Activity Tolerance Patient tolerated treatment well   Patient Left in chair;with call bell/phone within reach;with family/visitor present   Nurse Communication  (Pt 93-94 on RA, RN ok'd to leave O2 off for now)  Time: [id number] OT Time Calculation (min): 28 min  Charges: OT General Charges $OT Visit: 1 Procedure OT Treatments $Self Care/Home Management : 23-37 mins  Almon Register [id number] 05/09/2015, 11:17 AM

## 2015-05-09 NOTE — Progress Notes (Signed)
Advanced Heart Failure Rounding Note   Subjective:    Admitted from HF clinic with cellulitis and volume overload. Started on milrinone and vancomycin.  Failed milrinone wean so milrinone was restarted at 0.173mcg. Todays CO-OX 57% . CVP 11.    Denies SOB/Orthopnea.    Renal artery u/s: Findings suggest 1-59% renal artery stenosis bilaterally. There is evidence of elevated resistive indices of bilateral intrarenal arteries. There is evidence of elevated superior mesenteric artery velocities, suggestive of stenosis >70%.  Objective:   Weight Range:  Vital Signs:   Temp:  [97.2 F (36.2 C)-98 F (36.7 C)] 97.6 F (36.4 C) (12/07 0738) Pulse Rate:  [70-83] 77 (12/07 0600) Resp:  [16-20] 16 (12/07 0738) BP: (96-113)/(40-80) 106/60 mmHg (12/07 0738) SpO2:  [96 %-100 %] 98 % (12/07 0738) Weight:  [114 lb 10.2 oz (52 kg)] 114 lb 10.2 oz (52 kg) (12/07 0345) Last BM Date: 05/08/15  Weight change: Filed Weights   05/07/15 0401 05/08/15 0506 05/09/15 0345  Weight: 109 lb 9.1 oz (49.7 kg) 116 lb 6.4 oz (52.799 kg) 114 lb 10.2 oz (52 kg)    Intake/Output:   Intake/Output Summary (Last 24 hours) at 05/09/15 0813 Last data filed at 05/09/15 0600  Gross per 24 hour  Intake  874.1 ml  Output   1150 ml  Net -275.9 ml     Physical Exam: CVP ~11-12  General:  Elderly woman. No resp difficulty. In the bed.   HEENT: normal Neck: supple. JVP j~10. Carotids 2+ bilat; no bruits. No lymphadenopathy or thryomegaly appreciated. Cor: PMI laterally displaced. Regular rate & rhythm. 2/6 AS and 2/6 MR Lungs: clear Abdomen: soft, nontender, nondistended. No hepatosplenomegaly. No bruits or masses. Good bowel sounds. Extremities: no cyanosis, clubbing, rash,  R and LLE trace to tr edema. RLE with ace wrap. Damien Fusi PICC    Neuro: alert & orientedx3, cranial nerves grossly intact. moves all 4 extremities w/o difficulty. Affect pleasant  Telemetry: NSR   Labs: Basic Metabolic  Panel:  Recent Labs Lab 05/03/15 0516  05/04/15 1120 05/05/15 0547 05/06/15 0516 05/07/15 1045 05/08/15 0845 05/09/15 0430  NA 139  < >  --  141 138 137 133* 132*  K 4.2  < >  --  4.5 4.2 5.1 5.1 4.5  CL 98*  < >  --  101 97* 96* 95* 91*  CO2 34*  < >  --  33* 33* 30 31 30   GLUCOSE 120*  < >  --  132* 116* 191* 135* 133*  BUN 82*  < >  --  68* 61* 71* 77* 80*  CREATININE 2.02*  < >  --  1.96* 1.89* 2.72* 2.90* 2.89*  CALCIUM 8.2*  < >  --  8.2* 8.1* 8.3* 8.1* 7.7*  MG 2.6*  --  2.5*  --   --   --   --   --   < > = values in this interval not displayed.  Liver Function Tests: No results for input(s): AST, ALT, ALKPHOS, BILITOT, PROT, ALBUMIN in the last 168 hours. No results for input(s): LIPASE, AMYLASE in the last 168 hours. No results for input(s): AMMONIA in the last 168 hours.  CBC:  Recent Labs Lab 05/07/15 0410  WBC 7.2  HGB 11.2*  HCT 35.2*  MCV 97.2  PLT 249    Cardiac Enzymes: No results for input(s): CKTOTAL, CKMB, CKMBINDEX, TROPONINI in the last 168 hours.  BNP: BNP (last 3 results)  Recent Labs  06/15/14  1421  BNP 3172.6*    ProBNP (last 3 results) No results for input(s): PROBNP in the last 8760 hours.    Other results:  Imaging: No results found.   Medications:     Scheduled Medications: . allopurinol  100 mg Oral Daily  . amiodarone  200 mg Oral BID  . antiseptic oral rinse  7 mL Mouth Rinse BID  . apixaban  2.5 mg Oral BID  . cephALEXin  250 mg Oral 3 times per day  . cholecalciferol  400 Units Oral Daily  . [START ON 05/22/2015] cyanocobalamin  1,000 mcg Intramuscular Q30 days  . furosemide  120 mg Intravenous BID  . mupirocin cream   Topical Daily  . oxybutynin  10 mg Oral q morning - 10a  . pantoprazole  40 mg Oral Daily  . simvastatin  20 mg Oral q1800  . sodium chloride  10-40 mL Intracatheter Q12H  . sodium chloride  3 mL Intravenous Q12H    Infusions: . milrinone 0.125 mcg/kg/min (05/07/15 1335)    PRN  Medications: sodium chloride, acetaminophen, calcium carbonate, ondansetron (ZOFRAN) IV, oxyCODONE, sodium chloride, sodium chloride, traMADol   Assessment:   1. A/C Systolic Heart Failure due to ischemic CM  EF 20% 2. A/C Renal Failure, CKD 4 with cardiorenal disease    -Renal Ultrasound with medicorenal disease and mild to moderate R renal atrophy 3. Cellulitis 4. PAF 5. CAD H/O late-presenting anterior STEMI 03/2014  6. PSVT 7. Acute gout in R ankle  Plan/Discussion:   Failed milrinone wean. CO-OX dropped to 52% + worsening renal function.  Todays CO-OX 57%.  Remains on milrinone 0.125 mcg. CVP 11. Stop IV lasix start torsemide 60 mg twice a day. No bb with low output. Hold off on hydralazine/imdur for now.   Cellulitis much improved.  Continue keflex. Continue xeroform and mild compression.   Urine Acid 12.0  Gout improved with prednisone. Completed 3 days of prednisone.   Renal Ultrasound with medicorenal disease and mild to moderate R renal atrophy. No hydronephrosis.  Renal artery u/s without high-grade RAS.  Has PAF. Continue eliquis, reduced dose -age, weight, and renal function.   PT/OT recommending HHPT/HHOT. I will verify with PT. ? SNF versus HH.  [facility] to [facility] for home milrinone.   She requesting full code.   Length of Stay: [place] NP-C  05/09/2015, 8:13 AM  Advanced Heart Failure Team Pager [phone] (M-F; 7a - 4p)  Please contact [facility] for night-coverage after hours (4p -7a ) and weekends on [email or web address]   Patient seen and examined with Darrick Grinder, NP. We discussed all aspects of the encounter. I agree with the assessment and plan as stated above.   Improving with restarting milrinone. Volume status improved. Renal function turning the corner.Cellulitis improved. PT now recommending HHPT and not SNF. Will need to discuss with family more today.Will need home inotropes. Continue Eliquis for PAF.   Bensimhon, Daniel,MD 8:50 PM

## 2015-05-09 NOTE — Clinical Social Work Placement (Signed)
   CLINICAL SOCIAL WORK PLACEMENT  NOTE  Date:  05/09/2015  Patient Details  Name: Leah Kramer MRN: [id number] Date of Birth: [birth year]  Clinical Social Work is seeking post-discharge placement for this patient at the [place] level of care (*CSW will initial, date and re-position this form in  chart as items are completed):  Yes   Patient/family provided with [place] Work Department's list of facilities offering this level of care within the geographic area requested by the patient (or if unable, by the patient's family).  Yes   Patient/family informed of their freedom to choose among providers that offer the needed level of care, that participate in Medicare, Medicaid or managed care program needed by the patient, have an available bed and are willing to accept the patient.  Yes   Patient/family informed of [place]'s ownership interest in [facility] and [facility], as well as of the fact that they are under no obligation to receive care at these facilities.  PASRR submitted to EDS on 05/09/15     PASRR number received on 05/09/15     Existing PASRR number confirmed on       FL2 transmitted to all facilities in geographic area requested by pt/family on 05/09/15     FL2 transmitted to all facilities within larger geographic area on       Patient informed that his/her managed care company has contracts with or will negotiate with certain facilities, including the following:            Patient/family informed of bed offers received.  Patient chooses bed at       Physician recommends and patient chooses bed at      Patient to be transferred to   on  .  Patient to be transferred to facility by       Patient family notified on   of transfer.  Name of family member notified:        PHYSICIAN       Additional Comment:    _______________________________________________ Dulcy Fanny, LCSW 05/09/2015, 2:49 PM

## 2015-05-09 NOTE — Clinical Social Work Note (Signed)
Clinical Social Work Assessment  Patient Details  Name: Khadisha Crosslin MRN: [id number] Date of Birth: [birth year]  Date of referral:  05/09/15               Reason for consult:  Facility Placement                Permission sought to share information with:  Facility Sport and exercise psychologist, Family Supports Permission granted to share information::  Yes, Verbal Permission Granted  Name::      [name], daughter)  Agency::   ([place], [place], [place])  Relationship::     Contact Information:     Housing/Transportation Living arrangements for the past 2 months:  [place] of Information:  Adult Children (daughter [name]) Patient Interpreter Needed:  None Criminal Activity/Legal Involvement Pertinent to Current Situation/Hospitalization:  No - Comment as needed Significant Relationships:  Adult Children, Spouse Lives with:  Spouse Do you feel safe going back to the place where you live?  No (weakness and IV meds ) Need for family participation in patient care:     Care giving concerns:  Patient's daughter, [name] is concerned about patient's current weakness and IV med needs at time of discharge.  Patient lives with elderly husband.   Social Worker assessment / plan:  CSW spoke with daughter regarding placement needs (per patient's request).  Daughter [name] states that the patient lives at home with her elderly husband and is not strong enough to return home.  Patient currently has the need for milrinone drip at time of discharge.  Daughter is aware of this being a medication that only a mere few SNFs can accommodate.  Daughter is aware. Daughter is requesting SNF search of [place], [place] and [place].  MD believes that [facility] is able to accommodate milrinone.  CSW will inquire.  [name] states a lady by the name of [name] at [facility] reported to her that they would be able to assist the SNF in the administering and changing of this  drip.  CSW will begin referral process and follow-up with [name] regarding bed offers and accommodations for IV meds.  Employment status:  Retired Forensic scientist:  [employer] PT Recommendations:  [place], Home with [place] / Referral to community resources:  [place]  Patient/Family's Response to care:  Patient and daughter are both agreeable to SNF search.  Patient/Family's Understanding of and Emotional Response to Diagnosis, Current Treatment, and Prognosis:  Patient has limited insight into medical condition and prognosis.  Daughter, [name] has much more insight and is very realistic regarding prognosis and level of care needed at time of discharge.  Emotional Assessment Appearance:  Appears stated age Attitude/Demeanor/Rapport:   (appropriate) Affect (typically observed):  Accepting, Adaptable Orientation:  Oriented to Self, Oriented to Place, Oriented to  Time, Oriented to Situation Alcohol / Substance use:  Not Applicable Psych involvement (Current and /or in the community):  No (Comment)  Discharge Needs  Concerns to be addressed:  Medication Concerns Readmission within the last 30 days:  No Current discharge risk:  None Barriers to Discharge:  Continued Medical Work up   Dulcy Fanny, LCSW 05/09/2015, 2:44 PM

## 2015-05-09 NOTE — NC FL2 (Signed)
[place] MEDICAID FL2 LEVEL OF CARE SCREENING TOOL     IDENTIFICATION  Patient Name: Breasha Bosarge Birthdate: [birth year] Sex: female Admission Date (Current Location): 05/01/2015  [facility] and [place] Number: [employer] and Address:  [facility] [place], [place] [address], [place], [place] [address]      Provider Number: [id number]  Attending Physician Name and Address:  Jolaine Artist, MD  Relative Name and Phone Number:       Current Level of Care: Hospital Recommended Level of Care: [place] Prior Approval Number:    Date Approved/Denied:   PASRR Number: [id number]  Discharge Plan: SNF    Current Diagnoses: Patient Active Problem List   Diagnosis Date Noted  . CKD (chronic kidney disease) stage 4, GFR 15-29 ml/min (HCC) 05/01/2015  . Acute on chronic systolic heart failure, NYHA class 3 ([place]) 05/01/2015  . Pressure ulcer 05/01/2015  . Cellulitis of right leg 04/17/2015  . Atrial fibrillation ([place]) 05/19/2014  . Chronic systolic heart failure ([place]) 05/19/2014  . Arterial hypotension   . Renal insufficiency   . Congestive heart disease ([place])   . Acute on chronic systolic congestive heart failure ([place]) 05/07/2014  . Severe mitral regurgitation 05/07/2014  . Chronic renal failure, stage 4 (severe) ([place]) 05/07/2014  . Coronary artery disease due to lipid rich plaque 05/07/2014  . Acute respiratory failure ([place]) 05/06/2014    Orientation ACTIVITIES/SOCIAL BLADDER RESPIRATION    Self, Time, Situation, Place  Family supportive Continent O2 (As needed) (3L)  BEHAVIORAL SYMPTOMS/MOOD NEUROLOGICAL BOWEL NUTRITION STATUS      Continent Diet (Carb modified)  PHYSICIAN VISITS COMMUNICATION OF NEEDS Height & Weight Skin  30 days Verbally 5\' 3"  (160 cm) 113 lbs. Surgical wounds, PU Stage and Appropriate Care PU Stage 1 Dressing: BID (dry)        AMBULATORY STATUS RESPIRATION    Assist independent O2 (As needed) (3L)       Personal Care Assistance Level of Assistance  Dressing, Bathing Bathing Assistance: Limited assistance   Dressing Assistance: Limited assistance      Functional Limitations Info                SPECIAL CARE FACTORS FREQUENCY  PT (By licensed PT), OT (By licensed OT) (Patient will be discharged on  IV Milrinone Drip )     PT Frequency: daily OT Frequency: daily           Additional Factors Info  Code Status, Allergies Code Status Info: FULL Allergies Info: Lipitor, Sulfa antibiotics,            Current Medications (05/09/2015):  This is the current hospital active medication list Current Facility-Administered Medications  Medication Dose Route Frequency Provider Last Rate Last Dose  . 0.9 %  sodium chloride infusion  250 mL Intravenous PRN [name] D Clegg, NP 8 mL/hr at 05/08/15 1900 250 mL at 05/08/15 1900  . acetaminophen (TYLENOL) tablet 650 mg  650 mg Oral Q4H PRN Conrad Trenton, NP   650 mg at 05/01/15 2001  . allopurinol (ZYLOPRIM) tablet 100 mg  100 mg Oral Daily Jolaine Artist, MD   100 mg at 05/09/15 0934  . amiodarone (PACERONE) tablet 200 mg  200 mg Oral BID [name] D Clegg, NP   200 mg at 05/09/15 0933  . antiseptic oral rinse (CPC / CETYLPYRIDINIUM CHLORIDE 0.05%) solution 7 mL  7 mL Mouth Rinse BID Jolaine Artist, MD   7 mL at 05/09/15  0800  . apixaban (ELIQUIS) tablet 2.5 mg  2.5 mg Oral BID Conrad Wisdom, NP   2.5 mg at 05/09/15 0934  . calcium carbonate (TUMS - dosed in mg elemental calcium) chewable tablet 200 mg of elemental calcium  1 tablet Oral TID PRN [name] D Clegg, NP      . cephALEXin (KEFLEX) capsule 250 mg  250 mg Oral 3 times per day Jolaine Artist, MD   250 mg at 05/09/15 1452  . cholecalciferol (VITAMIN D) tablet 400 Units  400 Units Oral Daily Conrad Huntland, NP   400 Units at 05/09/15 0934  . [START ON 05/22/2015] cyanocobalamin ((VITAMIN B-12)) injection 1,000 mcg  1,000 mcg Intramuscular Q30 days Jolaine Artist, MD      . milrinone  [facility]) 20 MG/100ML (0.2 mg/mL) infusion  0.125 mcg/kg/min Intravenous Continuous [name] D Clegg, NP 1.9 mL/hr at 05/09/15 1100 0.125 mcg/kg/min at 05/09/15 1100  . mupirocin cream (BACTROBAN) 2 %   Topical Daily Shaune Pascal Bensimhon, MD      . ondansetron [facility]) injection 4 mg  4 mg Intravenous Q6H PRN [name] D Clegg, NP      . oxybutynin (DITROPAN-XL) 24 hr tablet 10 mg  10 mg Oral q morning - 10a [name] D Clegg, NP   10 mg at 05/09/15 0934  . oxyCODONE (Oxy IR/ROXICODONE) immediate release tablet 2.5-5 mg  2.5-5 mg Oral Q4H PRN Lamar Sprinkles, MD   5 mg at 05/09/15 0603  . pantoprazole (PROTONIX) EC tablet 40 mg  40 mg Oral Daily [name] D Clegg, NP   40 mg at 05/09/15 0933  . simvastatin (ZOCOR) tablet 20 mg  20 mg Oral q1800 [name] D Clegg, NP   20 mg at 05/08/15 1711  . sodium chloride 0.9 % injection 10-40 mL  10-40 mL Intracatheter Q12H Jolaine Artist, MD   10 mL at 05/09/15 0939  . sodium chloride 0.9 % injection 10-40 mL  10-40 mL Intracatheter PRN Jolaine Artist, MD      . sodium chloride 0.9 % injection 3 mL  3 mL Intravenous Q12H [name] D Clegg, NP   Stopped at 05/07/15 1000  . sodium chloride 0.9 % injection 3 mL  3 mL Intravenous PRN [name] D Clegg, NP      . torsemide (DEMADEX) tablet 60 mg  60 mg Oral BID Jolaine Artist, MD   60 mg at 05/09/15 0934  . traMADol (ULTRAM) tablet 50 mg  50 mg Oral Q6H PRN [name] D Clegg, NP   50 mg at 05/02/15 1511     Discharge Medications: Please see discharge summary for a list of discharge medications.  Relevant Imaging Results:  Relevant Lab Results:  Recent Labs    Additional Information Patient will need IV milrinone drip at time of discharge. Please only offer a bed if you are able to accommodate this need.  Thanks! SSN [id number]  Dulcy Fanny, LCSW

## 2015-05-09 NOTE — Progress Notes (Signed)
[facility] act [facility]. Will need home milrinone. Pam chandler w [facility] to work on home milrinone and will ck to see if [facility] does mil[facility]one or if [facility] will need to cover for [facility]hrn.

## 2015-05-10 LAB — BASIC METABOLIC PANEL
ANION GAP: 11 (ref 5–15)
BUN: 83 mg/dL — ABNORMAL HIGH (ref 6–20)
CALCIUM: 7.6 mg/dL — AB (ref 8.9–10.3)
CO2: 31 mmol/L (ref 22–32)
Chloride: 89 mmol/L — ABNORMAL LOW (ref 101–111)
Creatinine, Ser: 2.82 mg/dL — ABNORMAL HIGH (ref 0.44–1.00)
GFR, EST AFRICAN AMERICAN: 16 mL/min — AB (ref >60)
GFR, EST NON AFRICAN AMERICAN: 14 mL/min — AB (ref >60)
GLUCOSE: 107 mg/dL — AB (ref 65–99)
Potassium: 4 mmol/L (ref 3.5–5.1)
Sodium: 131 mmol/L — ABNORMAL LOW (ref 135–145)

## 2015-05-10 LAB — CARBOXYHEMOGLOBIN
CARBOXYHEMOGLOBIN: 1.1 % (ref 0.5–1.5)
METHEMOGLOBIN: 0.9 % (ref 0.0–1.5)
O2 Saturation: 56.6 %
Total hemoglobin: 9.7 g/dL — ABNORMAL LOW (ref 12.0–16.0)

## 2015-05-10 MED ORDER — FUROSEMIDE 10 MG/ML IJ SOLN
80.0000 mg | Freq: Once | INTRAMUSCULAR | Status: AC
  Administered 2015-05-10: 80 mg via INTRAVENOUS
  Filled 2015-05-10: qty 8

## 2015-05-10 NOTE — Progress Notes (Signed)
CM notified Pam with [facility] infusion that patient will be discharging to SNF instead of home with [facility].  Lorne Skeens RN, MSN [phone]

## 2015-05-10 NOTE — Clinical Social Work Note (Signed)
CSW reviewed information with daughter that CSW could locate no facility in [facility] who could accommodate Milrinone.  Daughter to review change of discharge plans with patient and patient's husband tonight for possible discharge tomorrow.    Patient will be discharged to [facility] SNF once medically stable.    Nonnie Done, LCSW [phone]  [place] [id number]  Licensed Clinical Social Worker

## 2015-05-10 NOTE — Care Management Important Message (Signed)
Important Message  Patient Details  Name: Arlett Maiorino MRN: [id number] Date of Birth: 25-Apr-1924   Medicare Important Message Given:  Yes    Fady Stamps P Muaad Boehning 05/10/2015, 11:13 AM

## 2015-05-10 NOTE — Progress Notes (Signed)
Advanced Heart Failure Rounding Note   Subjective:    Admitted from HF clinic with cellulitis and volume overload. Started on milrinone and vancomycin.  Failed milrinone wean so milrinone was restarted at 0.140mcg. Todays CO-OX 57% . CVP 11.   Denies SOB/Orthopnea.  Requiring assistance with mobility.   Renal artery u/s: Findings suggest 1-59% renal artery stenosis bilaterally. There is evidence of elevated resistive indices of bilateral intrarenal arteries. There is evidence of elevated superior mesenteric artery velocities, suggestive of stenosis >70%.  Objective:   Weight Range:  Vital Signs:   Temp:  [97.6 F (36.4 C)-98.4 F (36.9 C)] 97.6 F (36.4 C) (12/08 0726) Pulse Rate:  [69-81] 69 (12/08 0726) Resp:  [16] 16 (12/08 0726) BP: (90-108)/(49-82) 102/53 mmHg (12/08 0726) SpO2:  [94 %-100 %] 98 % (12/08 0726) Weight:  [113 lb 3.7 oz (51.36 kg)-113 lb 15.7 oz (51.7 kg)] 113 lb 3.7 oz (51.36 kg) (12/08 0500) Last BM Date: 05/08/15  Weight change: Filed Weights   05/09/15 0345 05/09/15 0831 05/10/15 0500  Weight: 114 lb 10.2 oz (52 kg) 113 lb 15.7 oz (51.7 kg) 113 lb 3.7 oz (51.36 kg)    Intake/Output:   Intake/Output Summary (Last 24 hours) at 05/10/15 0738 Last data filed at 05/10/15 0500  Gross per 24 hour  Intake  217.8 ml  Output   2000 ml  Net -1782.2 ml     Physical Exam: CVP ~11  General:  Elderly woman. No resp difficulty. In the bed.   HEENT: normal Neck: supple. JVP ~10. Carotids 2+ bilat; no bruits. No lymphadenopathy or thryomegaly appreciated. Cor: PMI laterally displaced. Regular rate & rhythm. 2/6 AS and 2/6 MR Lungs: clear Abdomen: soft, nontender, nondistended. No hepatosplenomegaly. No bruits or masses. Good bowel sounds. Extremities: no cyanosis, clubbing, rash,  R and LLE trace to tr edema. RLE with ace wrap. Damien Fusi PICC    Neuro: alert & orientedx3, cranial nerves grossly intact. moves all 4 extremities w/o difficulty. Affect  pleasant  Telemetry: NSR   Labs: Basic Metabolic Panel:  Recent Labs Lab 05/04/15 1120  05/06/15 0516 05/07/15 1045 05/08/15 0845 05/09/15 0430 05/10/15 0350  NA  --   < > 138 137 133* 132* 131*  K  --   < > 4.2 5.1 5.1 4.5 4.0  CL  --   < > 97* 96* 95* 91* 89*  CO2  --   < > 33* 30 31 30 31   GLUCOSE  --   < > 116* 191* 135* 133* 107*  BUN  --   < > 61* 71* 77* 80* 83*  CREATININE  --   < > 1.89* 2.72* 2.90* 2.89* 2.82*  CALCIUM  --   < > 8.1* 8.3* 8.1* 7.7* 7.6*  MG 2.5*  --   --   --   --   --   --   < > = values in this interval not displayed.  Liver Function Tests: No results for input(s): AST, ALT, ALKPHOS, BILITOT, PROT, ALBUMIN in the last 168 hours. No results for input(s): LIPASE, AMYLASE in the last 168 hours. No results for input(s): AMMONIA in the last 168 hours.  CBC:  Recent Labs Lab 05/07/15 0410  WBC 7.2  HGB 11.2*  HCT 35.2*  MCV 97.2  PLT 249    Cardiac Enzymes: No results for input(s): CKTOTAL, CKMB, CKMBINDEX, TROPONINI in the last 168 hours.  BNP: BNP (last 3 results)  Recent Labs  06/15/14 1421  BNP  3172.6*    ProBNP (last 3 results) No results for input(s): PROBNP in the last 8760 hours.    Other results:  Imaging: No results found.   Medications:     Scheduled Medications: . allopurinol  100 mg Oral Daily  . amiodarone  200 mg Oral BID  . antiseptic oral rinse  7 mL Mouth Rinse BID  . apixaban  2.5 mg Oral BID  . cephALEXin  250 mg Oral 3 times per day  . cholecalciferol  400 Units Oral Daily  . [START ON 05/22/2015] cyanocobalamin  1,000 mcg Intramuscular Q30 days  . mupirocin cream   Topical Daily  . oxybutynin  10 mg Oral q morning - 10a  . pantoprazole  40 mg Oral Daily  . simvastatin  20 mg Oral q1800  . sodium chloride  10-40 mL Intracatheter Q12H  . sodium chloride  3 mL Intravenous Q12H  . torsemide  60 mg Oral BID    Infusions: . milrinone 0.125 mcg/kg/min (05/09/15 1100)    PRN  Medications: sodium chloride, acetaminophen, calcium carbonate, ondansetron (ZOFRAN) IV, oxyCODONE, sodium chloride, sodium chloride, traMADol   Assessment:   1. A/C Systolic Heart Failure due to ischemic CM  EF 20% 2. A/C Renal Failure, CKD 4 with cardiorenal disease    -Renal Ultrasound with medicorenal disease and mild to moderate R renal atrophy 3. Cellulitis 4. PAF 5. CAD H/O late-presenting anterior STEMI 03/2014  6. PSVT 7. Acute gout in R ankle  Plan/Discussion:   Failed milrinone wean.   Todays CO-OX 57%.  Remains on milrinone 0.125 mcg. CVP 11 which is the same as yesterday.  Stable on  torsemide 60 mg twice a day. No bb with low output. Hold off on hydralazine/imdur for now.   Cellulitis much improved.  Continue keflex. Continue xeroform and mild compression.   Urine Acid 12.0  Gout improved with prednisone. Completed 3 days of prednisone. Start allopurinol.   Renal Ultrasound with medicorenal disease and mild to moderate R renal atrophy. No hydronephrosis.  Renal artery u/s without high-grade RAS.  Has PAF. Continue eliquis, reduced dose -age, weight, and renal function.   PT/OT revaluted and recommending short term SNF.  Social Work following.    She requesting full code.    OK to discharge to SNF when bed available that can take milrinone.   Length of Stay: [place] NP-C  05/10/2015, 7:38 AM  Advanced Heart Failure Team Pager [phone] (M-F; 7a - 4p)  Please contact [facility] for night-coverage after hours (4p -7a ) and weekends on [email or web address]   Patient seen and examined with Darrick Grinder, NP. We discussed all aspects of the encounter. I agree with the assessment and plan as stated above.   Stable on milrinone. CVP still elevated. Will give one dose of IV lasix. Likely to SNF tomorrow.  Bensimhon, Daniel,MD 5:02 PM

## 2015-05-11 LAB — BASIC METABOLIC PANEL
Anion gap: 10 (ref 5–15)
BUN: 78 mg/dL — AB (ref 6–20)
CHLORIDE: 92 mmol/L — AB (ref 101–111)
CO2: 33 mmol/L — ABNORMAL HIGH (ref 22–32)
Calcium: 7.6 mg/dL — ABNORMAL LOW (ref 8.9–10.3)
Creatinine, Ser: 2.6 mg/dL — ABNORMAL HIGH (ref 0.44–1.00)
GFR calc Af Amer: 18 mL/min — ABNORMAL LOW (ref >60)
GFR calc non Af Amer: 15 mL/min — ABNORMAL LOW (ref >60)
Glucose, Bld: 108 mg/dL — ABNORMAL HIGH (ref 65–99)
POTASSIUM: 3.3 mmol/L — AB (ref 3.5–5.1)
SODIUM: 135 mmol/L (ref 135–145)

## 2015-05-11 LAB — CARBOXYHEMOGLOBIN
Carboxyhemoglobin: 1 % (ref 0.5–1.5)
Methemoglobin: 0.9 % (ref 0.0–1.5)
O2 Saturation: 55.6 %
TOTAL HEMOGLOBIN: 9.9 g/dL — AB (ref 12.0–16.0)

## 2015-05-11 MED ORDER — ALLOPURINOL 100 MG PO TABS: 100.0000 mg | ORAL_TABLET | Freq: Every day | ORAL | Status: AC

## 2015-05-11 MED ORDER — AMIODARONE HCL 200 MG PO TABS: 200.0000 mg | ORAL_TABLET | Freq: Two times a day (BID) | ORAL | Status: AC

## 2015-05-11 MED ORDER — METOLAZONE 2.5 MG PO TABS: 2.5000 mg | ORAL_TABLET | ORAL | Status: AC | PRN

## 2015-05-11 MED ORDER — TORSEMIDE 20 MG PO TABS: 60.0000 mg | ORAL_TABLET | Freq: Two times a day (BID) | ORAL | Status: DC

## 2015-05-11 MED ORDER — MILRINONE IN DEXTROSE 20 MG/100ML IV SOLN: 0.1250 ug/kg/min | INTRAVENOUS | Status: AC

## 2015-05-11 MED ORDER — POTASSIUM CHLORIDE CRYS ER 20 MEQ PO TBCR
40.0000 meq | EXTENDED_RELEASE_TABLET | Freq: Once | ORAL | Status: AC
  Administered 2015-05-11: 40 meq via ORAL
  Filled 2015-05-11: qty 2

## 2015-05-11 NOTE — Progress Notes (Signed)
Discharge instruction complete with pt and family at bedside. No further questions, comments or concerns.  Emotional support given.  Pt wheeled out to daughter's car.

## 2015-05-11 NOTE — Care Management Note (Addendum)
Case Management Note  Patient Details  Name: Leah Kramer MRN: [id number] Date of Birth: Mar 20, 1924  Subjective/Objective:                    Action/Plan: Met with patient and daughter to discuss discharge planning. Patient's disposition was previously SNF on Milrinone, but family has now chosen to take patient home. Patient was previously active with [facility], but will now be discharging to her daughter, [place], home, [place], [place].  Daughter's preferred contact number is [phone].  CM spoke with Pam with [facility], who will be providing the medication for [facility].  Pam has provided updated to [facility] regarding change in discharge disposition and will ensure that the patient's discharge address is updated.  CM provided patient's daughter with a private duty agency list in the event that additional help is needed in the home.  Bedside RN and CSW are aware of change in discharge disposition.  Addendum:  Received a message from [facility] with [facility] that [facility] is unable to staff patient's [place] needs at her daughter's address.  [facility] will assume care for the duration of patient's stay in [place] with [facility] resuming care once patient has returned to her home in [place], [place].  Patient and daughter are aware and agree with plan of care.  Expected Discharge Date:                  Expected Discharge Plan:  [place]  In-House Referral:     Discharge planning Services  CM Consult  Post Acute Care Choice:  Resumption of Svcs/PTA Provider, Durable Medical Equipment Choice offered to:     DME Arranged:  IV pump/equipment DME Agency:  [place]:  RN, PT, OT, Social Work, Nurse's Aide (Per CHF orders, at the discretion of the [facility] agency) [facility] Agency:  [place]  Status of Service:  Completed, signed off  Medicare Important Message Given:  Yes Date Medicare IM Given:    Medicare IM give  by:    Date Additional Medicare IM Given:    Additional Medicare Important Message give by:     If discussed at [place] of Stay Meetings, dates discussed:    Additional CommentsRolm Baptise, RN 05/11/2015, 10:42 AM [phone]

## 2015-05-11 NOTE — Progress Notes (Signed)
Advanced Heart Failure Rounding Note   Subjective:    Admitted from HF clinic with cellulitis and volume overload. Started on milrinone and vancomycin.  Failed milrinone wean so milrinone was restarted at 0.175mcg.   Yesterday she received 80 mg IV lasix x1. Negative 1.3 liters.  Todays CO-OX 56% . CVP 13.   Today her daughter would like to discharge her Mom to home with home health. Her daughter can provide 24 hour assistance. Denies SOB/Orthopnea.   Renal artery u/s: Findings suggest 1-59% renal artery stenosis bilaterally. There is evidence of elevated resistive indices of bilateral intrarenal arteries. There is evidence of elevated superior mesenteric artery velocities, suggestive of stenosis >70%.  Objective:   Weight Range:  Vital Signs:   Temp:  [97.6 F (36.4 C)-98.5 F (36.9 C)] 98.2 F (36.8 C) (12/09 0742) Pulse Rate:  [62-76] 71 (12/09 1000) Resp:  [18] 18 (12/08 1621) BP: (92-110)/(49-66) 110/52 mmHg (12/09 1000) SpO2:  [91 %-100 %] 95 % (12/09 1000) Weight:  [113 lb 12.8 oz (51.619 kg)] 113 lb 12.8 oz (51.619 kg) (12/09 0437) Last BM Date: 05/08/15  Weight change: Filed Weights   05/09/15 0831 05/10/15 0500 05/11/15 0437  Weight: 113 lb 15.7 oz (51.7 kg) 113 lb 3.7 oz (51.36 kg) 113 lb 12.8 oz (51.619 kg)    Intake/Output:   Intake/Output Summary (Last 24 hours) at 05/11/15 1007 Last data filed at 05/11/15 0946  Gross per 24 hour  Intake  907.3 ml  Output   2750 ml  Net -1842.7 ml     Physical Exam: CVP ~12-13  General:  Elderly woman. No resp difficulty. Sitting in the chair. Marland Kitchen   HEENT: normal Neck: supple. JVP ~10. Carotids 2+ bilat; no bruits. No lymphadenopathy or thryomegaly appreciated. Cor: PMI laterally displaced. Regular rate & rhythm. 2/6 AS and 2/6 MR Lungs: clear Abdomen: soft, nontender, nondistended. No hepatosplenomegaly. No bruits or masses. Good bowel sounds. Extremities: no cyanosis, clubbing, rash,  No edema. RUE PICC      Neuro: alert & orientedx3, cranial nerves grossly intact. moves all 4 extremities w/o difficulty. Affect pleasant  Telemetry: NSR 70s  Labs: Basic Metabolic Panel:  Recent Labs Lab 05/04/15 1120  05/07/15 1045 05/08/15 0845 05/09/15 0430 05/10/15 0350 05/11/15 0500  NA  --   < > 137 133* 132* 131* 135  K  --   < > 5.1 5.1 4.5 4.0 3.3*  CL  --   < > 96* 95* 91* 89* 92*  CO2  --   < > 30 31 30 31  33*  GLUCOSE  --   < > 191* 135* 133* 107* 108*  BUN  --   < > 71* 77* 80* 83* 78*  CREATININE  --   < > 2.72* 2.90* 2.89* 2.82* 2.60*  CALCIUM  --   < > 8.3* 8.1* 7.7* 7.6* 7.6*  MG 2.5*  --   --   --   --   --   --   < > = values in this interval not displayed.  Liver Function Tests: No results for input(s): AST, ALT, ALKPHOS, BILITOT, PROT, ALBUMIN in the last 168 hours. No results for input(s): LIPASE, AMYLASE in the last 168 hours. No results for input(s): AMMONIA in the last 168 hours.  CBC:  Recent Labs Lab 05/07/15 0410  WBC 7.2  HGB 11.2*  HCT 35.2*  MCV 97.2  PLT 249    Cardiac Enzymes: No results for input(s): CKTOTAL, CKMB, CKMBINDEX, TROPONINI  in the last 168 hours.  BNP: BNP (last 3 results)  Recent Labs  06/15/14 1421  BNP 3172.6*    ProBNP (last 3 results) No results for input(s): PROBNP in the last 8760 hours.    Other results:  Imaging: No results found.   Medications:     Scheduled Medications: . allopurinol  100 mg Oral Daily  . amiodarone  200 mg Oral BID  . antiseptic oral rinse  7 mL Mouth Rinse BID  . apixaban  2.5 mg Oral BID  . cephALEXin  250 mg Oral 3 times per day  . cholecalciferol  400 Units Oral Daily  . [START ON 05/22/2015] cyanocobalamin  1,000 mcg Intramuscular Q30 days  . mupirocin cream   Topical Daily  . oxybutynin  10 mg Oral q morning - 10a  . pantoprazole  40 mg Oral Daily  . potassium chloride  40 mEq Oral Once  . simvastatin  20 mg Oral q1800  . sodium chloride  10-40 mL Intracatheter Q12H  . sodium  chloride  3 mL Intravenous Q12H  . torsemide  60 mg Oral BID    Infusions: . milrinone 0.125 mcg/kg/min (05/11/15 0800)    PRN Medications: sodium chloride, acetaminophen, calcium carbonate, ondansetron (ZOFRAN) IV, oxyCODONE, sodium chloride, sodium chloride, traMADol   Assessment:   1. A/C Systolic Heart Failure due to ischemic CM  EF 20% 2. A/C Renal Failure, CKD 4 with cardiorenal disease    -Renal Ultrasound with medicorenal disease and mild to moderate R renal atrophy 3. Cellulitis 4. PAF 5. CAD H/O late-presenting anterior STEMI 03/2014  6. PSVT 7. Acute gout in R ankle  Plan/Discussion:   Failed milrinone wean.   Todays CO-OX 56%.  Remains on milrinone 0.125 mcg. CVP 12-13.  Stable on  torsemide 60 mg twice a day. No bb with low output. Hold off on hydralazine/imdur for now.   Cellulitis much improved.  Continue keflex. Continue xeroform and mild compression.   Urine Acid 12.0  Gout improved with prednisone. Completed 3 days of prednisone. Start allopurinol.   Renal Ultrasound with medicorenal disease and mild to moderate R renal atrophy. No hydronephrosis.  Renal artery u/s without high-grade RAS.  Has PAF. Continue eliquis, reduced dose -age, weight, and renal function.   PT/OT revaluted and recommending short term SNF.  Social Work following.  Now  4   She requesting full code.      Length of Stay: [place] NP-C  05/11/2015, 10:07 AM  Advanced Heart Failure Team Pager [phone] (M-F; 7a - 4p)  Please contact [facility] for night-coverage after hours (4p -7a ) and weekends on [email or web address]  Patient seen and examined with Darrick Grinder, NP. We discussed all aspects of the encounter. I agree with the assessment and plan as stated above.   Much improved with IV inotropes. Will plan d/c home today on milrinone. Reinforced need for daily weights and reviewed use of sliding scale diuretics. Cellulitis resolved. Can stop abx. (Has had 12 days). Start  allopurinol for gout. Wants full code for now.   Bensimhon, Daniel,MD 11:22 AM

## 2015-05-11 NOTE — Progress Notes (Signed)
[facility]  Patient Status: New pt for [facility] this admission  [facility] is providing the following services: [facility] Team will partner with [place] (SNF) to provide Milrinone therapy for pt at DC. [facility] hospital team will follow Ms. Klumpp to support DC when ordered.  If patient discharges after hours, please call [phone].   Larry Sierras 05/11/2015, 9:59 AM

## 2015-05-11 NOTE — Discharge Summary (Signed)
Advanced Heart Failure Team  Discharge Summary   Patient ID: Leah Kramer MRN: [id number], DOB/AGE: Jul 06, 1923 81 y.o. Admit date: 05/01/2015 D/C date:     05/11/2015   Primary Discharge Diagnoses:  1. A/C Systolic Heart Failure due to ischemic CM EF 20% 2. A/C Renal Failure, CKD 4 with cardiorenal disease  -Renal Ultrasound with medicorenal disease and mild to moderate R renal atrophy 3. Cellulitis 4. PAF 5. CAD H/O late-presenting anterior STEMI 03/2014  6. PSVT 7. Acute gout in R ankle  Hospital Course:  Leah Kramer is 81 year old female with history of heart failure, chronic kidney disease, hypertension, diabetes, CAD.She presented to [facility] in October 2015 with late STEMI. Treated medically. EF 20% and severe MR.   Admitted November 29th from HF clinic with marked volume overload, worsening renal function, and RLE cellulitis. Prior to admit carvedilol, hydralazine, and imdur stopped due to suspected low output and hypotension. She was diuresed with IV lasix and later milrinone 0.25 mcg was added due to worsening renal function and low output heart failure. Due to PSVT milrinone was stopped. Unfortunately mixed venous saturations dropped so milrinone was restarted at 0.125 mcg  and she was started on amio 200 mg twice a day. Mixed venous saturation improved on milrinone 0.125 mcg. She maintained NSR on lower dose of milrinone and with addition f f amiodarone. Overall she diuresed a few pounds with IV lasix and transitioned to torsemide 60 mg twice a day with metolazone as needed.   R ankle pain was attributed to acute gout flare with uric acid of 12. She received short burst of prednisone and started on allopurinol. On admit she had  RLE Cellulitis. Treated with IV Vancomycin and transitioned to keflex and completed 12 day course. Cellulitis resolved. She will continue conservative wound care to RLE.   [facility] [facility] will follow for [facility] and [facility] will follow for milrinone. While  hospitalized we discussed goals of care and at the conclusion she is requesting full code. She is aware that milrinone will not prolong her life. She will continue to be followed closely in the HF clinic and has follow up next week.    Discharge Weight: 113 pounds.  Discharge Vitals: Blood pressure 110/52, pulse 71, temperature 98.2 F (36.8 C), temperature source Oral, resp. rate 18, height 5\' 3"  (1.6 m), weight 113 lb 12.8 oz (51.619 kg), SpO2 95 %.  Labs: Lab Results  Component Value Date   WBC 7.2 05/07/2015   HGB 11.2* 05/07/2015   HCT 35.2* 05/07/2015   MCV 97.2 05/07/2015   PLT 249 05/07/2015     Recent Labs Lab 05/11/15 0500  NA 135  K 3.3*  CL 92*  CO2 33*  BUN 78*  CREATININE 2.60*  CALCIUM 7.6*  GLUCOSE 108*   No results found for: CHOL, HDL, LDLCALC, TRIG BNP (last 3 results)  Recent Labs  06/15/14 1421  BNP 3172.6*    ProBNP (last 3 results) No results for input(s): PROBNP in the last 8760 hours.   Diagnostic Studies/Procedures   No results found.  Discharge Medications     Medication List    STOP taking these medications        mupirocin ointment 2 %  Commonly known as:  BACTROBAN      TAKE these medications        acetaminophen 325 MG tablet  Commonly known as:  TYLENOL  Take 650 mg by mouth every 6 (six) hours as needed for mild pain.  allopurinol 100 MG tablet  Commonly known as:  ZYLOPRIM  Take 1 tablet (100 mg total) by mouth daily.     amiodarone 200 MG tablet  Commonly known as:  PACERONE  Take 1 tablet (200 mg total) by mouth 2 (two) times daily.     apixaban 2.5 MG Tabs tablet  Commonly known as:  ELIQUIS  Take 1 tablet (2.5 mg total) by mouth 2 (two) times daily.     calcium carbonate 500 MG chewable tablet  Commonly known as:  TUMS - dosed in mg elemental calcium  Chew 1 tablet by mouth 3 (three) times daily as needed for indigestion or heartburn.     calcium-vitamin D 500-200 MG-UNIT tablet  Commonly known  as:  OSCAL WITH D  Take 1 tablet by mouth daily with breakfast.     cholecalciferol 400 UNITS Tabs tablet  Commonly known as:  VITAMIN D  Take 400 Units by mouth at bedtime.     cyanocobalamin 1000 MCG/ML injection  Commonly known as:  (VITAMIN B-12)  Inject 1,000 mcg into the muscle every 30 (thirty) days. Takes on the 20th of every month     diclofenac sodium 1 % Gel  Commonly known as:  VOLTAREN  Apply 4 g topically 4 (four) times daily.     metolazone 2.5 MG tablet  Commonly known as:  ZAROXOLYN  Take 1 tablet (2.5 mg total) by mouth as needed. Take as needed for weight 116 pounds. No more than twice a week     milrinone 20 MG/100ML Soln infusion  Commonly known as:  PRIMACOR  Inject 6.2125 mcg/min into the vein continuous.     nitroGLYCERIN 0.4 MG SL tablet  Commonly known as:  NITROSTAT  Place 1 tablet (0.4 mg total) under the tongue every 5 (five) minutes as needed for chest pain.     omeprazole 20 MG capsule  Commonly known as:  PRILOSEC  Take 20 mg by mouth daily.     oxybutynin 10 MG 24 hr tablet  Commonly known as:  DITROPAN-XL  Take 1 tablet (10 mg total) by mouth every morning.     polyethylene glycol packet  Commonly known as:  MIRALAX / GLYCOLAX  Take 17 g by mouth daily as needed for mild constipation.     potassium chloride SA 20 MEQ tablet  Commonly known as:  K-DUR,KLOR-CON  Take 1 tablet (20 mEq total) by mouth daily. Take additional 80meq when you take Metolazone     simvastatin 20 MG tablet  Commonly known as:  ZOCOR  Take 1 tablet (20 mg total) by mouth daily.     torsemide 20 MG tablet  Commonly known as:  DEMADEX  Take 3 tablets (60 mg total) by mouth 2 (two) times daily.     vitamin E 400 UNIT capsule  Take 400 Units by mouth at bedtime.        Disposition   The patient will be discharged in stable condition to home. Discharge Instructions    Diet - low sodium heart healthy    Complete by:  As directed      Heart Failure  patients record your daily weight using the same scale at the same time of day    Complete by:  As directed      Increase activity slowly    Complete by:  As directed           Follow-up Information    Follow up with Darrick Grinder, NP On  05/16/2015.   Specialty:  Cardiology   Why:  Heart Failure Clinic. at 10:20. Garage Code [id number]   Contact information:   [address] [place] [address] [phone]         Duration of Discharge Encounter: Greater than 35 minutes   Signed, Darrick Grinder NP-C  05/11/2015, 11:21 AM  Patient seen and examined with Darrick Grinder, NP. We discussed all aspects of the encounter. I agree with the assessment and plan as stated above.   She is ready for d/c. Now inotrope dependent. Will go home on milrinone with HH following closely. Discussed with patient, family and [facility] team at bedside.   Andrianna Manalang,MD 12:12 PM

## 2015-05-15 ENCOUNTER — Telehealth (HOSPITAL_COMMUNITY): Payer: Self-pay

## 2015-05-15 NOTE — Telephone Encounter (Signed)
Cindy RN with [facility] called to report patient has refused [place] services.  Patient has been discharged from [facility] for this reason.  Patient has apt tomorrow, will make CHF providers aware.  Renee Pain

## 2015-05-16 ENCOUNTER — Ambulatory Visit (HOSPITAL_COMMUNITY)
Admit: 2015-05-16 | Discharge: 2015-05-16 | Disposition: A | Discharge status: Home / Self Care | Payer: Medicare Other | Source: Ambulatory Visit | Attending: Cardiology | Admitting: Cardiology

## 2015-05-16 VITALS — BP 86/42 | HR 74 | Wt 109.8 lb

## 2015-05-16 DIAGNOSIS — I5022 Chronic systolic (congestive) heart failure: Secondary | ICD-10-CM

## 2015-05-16 DIAGNOSIS — N184 Chronic kidney disease, stage 4 (severe): Secondary | ICD-10-CM | POA: Diagnosis not present

## 2015-05-16 DIAGNOSIS — I5023 Acute on chronic systolic (congestive) heart failure: Secondary | ICD-10-CM

## 2015-05-16 DIAGNOSIS — L0390 Cellulitis, unspecified: Secondary | ICD-10-CM | POA: Insufficient documentation

## 2015-05-16 DIAGNOSIS — I2510 Atherosclerotic heart disease of native coronary artery without angina pectoris: Secondary | ICD-10-CM | POA: Insufficient documentation

## 2015-05-16 DIAGNOSIS — I480 Paroxysmal atrial fibrillation: Secondary | ICD-10-CM | POA: Diagnosis not present

## 2015-05-16 DIAGNOSIS — L03115 Cellulitis of right lower limb: Secondary | ICD-10-CM

## 2015-05-16 DIAGNOSIS — M109 Gout, unspecified: Secondary | ICD-10-CM | POA: Insufficient documentation

## 2015-05-16 MED ORDER — TORSEMIDE 20 MG PO TABS: ORAL_TABLET | ORAL | Status: AC

## 2015-05-16 NOTE — Progress Notes (Addendum)
Patient ID: Leah Kramer, female   DOB: 08-Jun-1923, 81 y.o.   MRN: [id number] PCP: Dr Herma Ard   HPI: 81 year old female with history of heart failure, chronic kidney disease, hypertension, diabetes, CAD.  Per Care Everywhere she presented to [facility] in October with late STEMI. Treated medically. EF 20% and severe MR. Has been struggling with volume management in setting on chronic renal failure.   TTE 03/2014 [facility]): Severe LV dysfunction (EF 20%) with basal inferior, basal anterospetal, and basal anterior hypokinesis, dilated LV, diastolic dysfunction, elevated LV filling pressures, degen MV with severe MR, dilated LA, moderate AR, severe pulmonary hypertension, normal RV function, mild TR, elevated CVP  Admitted 12/4 with respiratory failure due to recurrent HF.  Required short tem milrionone and diuresed with IV lasix.She transitioned to torsemide.  She had short episode of A fib and converted spontaneously. Discharge weight was 117 pounds.   Admitted 11/29 through 05/11/2015 with cellulitis and volume overload. Started was diuresed with IV lasix and later milrinone 0.25 mcg was added due to worsening renal function and low output heart failure. Due to PSVT milrinone was stopped. Unfortunately mixed venous saturations dropped so milrinone was restarted at 0.125 mcg and she was started on amio 200 mg twice a day. Mixed venous saturation improved on milrinone 0.125 mcg. She maintained NSR on lower dose of milrinone and with addition of amiodarone. Treated with vanc and to kelfex to complete 12 day course. Discharge weight was 113 pounds.   She returns for post hospital follow up with her daughter and husband.  Remains on milrinone. Weight at home trending up 106-108 pounds.  Mild-Mod dyspnea with exertion.  Moving around the house. Bloody drainage around RUE PICC.  Increased leg edema. Requires assistance dressing. [facility] following for HHPT/HHRN.  Living with her daughter.   Labs 5/16 K 3.5, Cr  2.77 Labs 12/19/2014: K 3.8 Creatinine 2.42  Labs 05/11/2015: 3.3 Creatinine 2.6   ECHO 09/07/2014 EF 20% Grade II DD Mod Severe MR.   ROS: All systems negative except as listed in HPI, PMH and Problem List.  Past Medical History  Diagnosis Date  . CKD (chronic kidney disease)   . CHF (congestive heart failure) ([place])   . STEMI (ST elevation myocardial infarction) ([place])   . Hypertension   . Diabetes mellitus without complication ([place])   . Difficulty walking   . Overactive bladder   . Hyperlipidemia     Current Outpatient Prescriptions  Medication Sig Dispense Refill  . acetaminophen (TYLENOL) 325 MG tablet Take 650 mg by mouth every 6 (six) hours as needed for mild pain.    Marland Kitchen allopurinol (ZYLOPRIM) 100 MG tablet Take 1 tablet (100 mg total) by mouth daily. 30 tablet 6  . amiodarone (PACERONE) 200 MG tablet Take 1 tablet (200 mg total) by mouth 2 (two) times daily. 60 tablet 6  . apixaban (ELIQUIS) 2.5 MG TABS tablet Take 1 tablet (2.5 mg total) by mouth 2 (two) times daily. 180 tablet 3  . calcium carbonate (TUMS - DOSED IN MG ELEMENTAL CALCIUM) 500 MG chewable tablet Chew 1 tablet by mouth 3 (three) times daily as needed for indigestion or heartburn.    . calcium-vitamin D (OSCAL WITH D) 500-200 MG-UNIT tablet Take 1 tablet by mouth daily with breakfast.    . cholecalciferol (VITAMIN D) 400 UNITS TABS tablet Take 400 Units by mouth at bedtime.     . cyanocobalamin (,VITAMIN B-12,) 1000 MCG/ML injection Inject 1,000 mcg into the muscle every 30 (thirty)  days. Takes on the 20th of every month    . diclofenac sodium (VOLTAREN) 1 % GEL Apply 4 g topically 4 (four) times daily as needed (pain).    Marland Kitchen metolazone (ZAROXOLYN) 2.5 MG tablet Take 1 tablet (2.5 mg total) by mouth as needed. Take as needed for weight 116 pounds. No more than twice a week 8 tablet 3  . milrinone (PRIMACOR) 20 MG/100ML SOLN infusion Inject 6.2125 mcg/min into the vein continuous. 100 mL 12  . omeprazole (PRILOSEC) 20  MG capsule Take 20 mg by mouth daily.     Marland Kitchen oxybutynin (DITROPAN-XL) 10 MG 24 hr tablet Take 1 tablet (10 mg total) by mouth every morning. 30 tablet 0  . polyethylene glycol (MIRALAX / GLYCOLAX) packet Take 17 g by mouth daily as needed for mild constipation.     . potassium chloride SA (K-DUR,KLOR-CON) 20 MEQ tablet Take 1 tablet (20 mEq total) by mouth daily. Take additional 85meq when you take Metolazone 45 tablet 3  . simvastatin (ZOCOR) 20 MG tablet Take 1 tablet (20 mg total) by mouth daily. 30 tablet 2  . torsemide (DEMADEX) 20 MG tablet Take 3 tablets (60 mg total) by mouth 2 (two) times daily. 180 tablet 3  . vitamin E 400 UNIT capsule Take 400 Units by mouth at bedtime.     . nitroGLYCERIN (NITROSTAT) 0.4 MG SL tablet Place 1 tablet (0.4 mg total) under the tongue every 5 (five) minutes as needed for chest pain. (Patient not taking: Reported on 05/16/2015) 25 tablet 2   No current facility-administered medications for this encounter.     PHYSICAL EXAM: Filed Vitals:   05/16/15 1042  BP: 86/42  Pulse: 74  Weight: 109 lb 12.8 oz (49.805 kg)  SpO2: 97%    General:  Chronically ill appearing. No resp difficulty. Daughter and husband present. Arrived in wheel chair.  HEENT: normal Neck: supple. JVP to 9-10 cm. Carotids 2+ bilaterally; no bruits. No lymphadenopathy or thryomegaly appreciated. Cor: PMI normal. Regular rate & rhythm. No rubs, gallops.  2/6 early SEM RUSB.  PT pulses bilaterally.  Lungs: Clear decreased in the bases. .  Abdomen: soft, nontender, distended. No hepatosplenomegaly. No bruits or masses. Good bowel sounds. Extremities: no cyanosis, clubbing, rash, R and LLE 2+ edema. RUE PICC with bloody exudate noted under the dressing.  Neuro: alert & orientedx3, cranial nerves grossly intact. Moves all 4 extremities w/o difficulty. Affect pleasant.  ASSESSMENT & PLAN:  1. Chronic Systolic Heart Failure: Ischemic cardiomyopathy, EF 20% ECHO 4/16.  She is not a  candidate for advanced therapies due to her age.  On chronic home milrinone 0.125 mcg. She has NYHA class III symptoms Has narrow euvolemic window. Home weight goal 105-107 pounds. Volume status mildly elevated. Increase torsemide to 80 mg in and 60 mg in pm. Continue metolazone as needed.  No BB with low output. No hydralazine/imdur.  - No ACEI/ARB/spironolactone with CKD.  - Blood drainage noted around PICC RUE. I called [facility] and asked that she receive a PRN visit today for PICC line dressing. BMET per [facility] on a weekly basis.   2. CAD: S/P STEMI at [facility] in 10/15.  Late-presenting, no intervention.  One episode of fairly atypical chest pain in May, no exertional chest pain.  Currently no chest pain.  - On apixaban 2.5 mg twice a day so no aspirin.  Continue statin. No bb with low output.  3. Paroxysmal atrial fibrillation: Regular rhythm. Continue apixaban at reduced dose with age,  weight, and creatinine. No bleeding problems. .   4. Gout- On allopurinol daily.  5. Cellulitis- Completed antibiotic course for RLE cellulitis. .     Follow up in 2 weeks.     Billy Rocco NP-C  05/16/2015

## 2015-05-16 NOTE — Patient Instructions (Signed)
INCREASE Torsemide to 4 tabs (80 mg) in the AM and 3 tabs (60 mg) in the PM  Please take one additional dose (20 mg) of Torsemide today and start increased dose 05/17/15  Your physician recommends that you schedule a follow-up appointment in: 3 weeks  Do the following things EVERYDAY: 1) Weigh yourself in the morning before breakfast. Write it down and keep it in a log. 2) Take your medicines as prescribed 3) Eat low salt foods-Limit salt (sodium) to 2000 mg per day.  4) Stay as active as you can everyday 5) Limit all fluids for the day to less than 2 liters 6)

## 2015-05-16 NOTE — Progress Notes (Signed)
Advanced Heart Failure Medication Review by a Pharmacist  Does the patient  feel that his/her medications are working for him/her?  yes  Has the patient been experiencing any side effects to the medications prescribed?  no  Does the patient measure his/her own blood pressure or blood glucose at home?  yes   Does the patient have any problems obtaining medications due to transportation or finances?   no  Understanding of regimen: good Understanding of indications: good Potential of compliance: excellent Patient understands to avoid NSAIDs. Patient understands to avoid decongestants.  Issues to address at subsequent visits: None   Pharmacist comments:  Mrs. Matusik is a pleasant 81 yo F presenting with her husband and daughter and a hospital discharge medication list. She reports excellent compliance and did not have any specific medication-related questions or concerns for me at this time.   [name]. [name], PharmD, BCPS, CPP Clinical Pharmacist Pager: [phone] Phone: [phone] 05/16/2015 10:41 AM      Time with patient: 8 minutes Preparation and documentation time: 2 minutes Total time: 10 minutes

## 2015-05-23 ENCOUNTER — Telehealth (HOSPITAL_COMMUNITY): Payer: Self-pay | Admitting: *Deleted

## 2015-05-23 NOTE — Telephone Encounter (Signed)
Referral for home health faxed to [facility] in [place]

## 2015-05-25 ENCOUNTER — Other Ambulatory Visit (HOSPITAL_COMMUNITY): Payer: Self-pay | Admitting: Internal Medicine

## 2015-05-29 ENCOUNTER — Telehealth (HOSPITAL_COMMUNITY): Payer: Self-pay

## 2015-05-29 NOTE — Telephone Encounter (Signed)
Patient's daughter called to report 5 lb weight gain over past 2 days.  Had patient take metolazone tablet.  [facility] RN will be at home at 11:30 today.  Will wati to discuss further details of patient assessment when nurse is in the home to further evaluate situation to forward to MD.  Renee Pain

## 2015-05-30 ENCOUNTER — Encounter: Payer: Self-pay | Admitting: Internal Medicine

## 2015-05-30 ENCOUNTER — Telehealth (HOSPITAL_COMMUNITY): Payer: Self-pay

## 2015-05-30 NOTE — Telephone Encounter (Signed)
HH RN Freda Munro called to report weight difference of 5 lbs overnight for patient.  Hwover, assessment does not reflect any fluid increase.  Patient and daughter admit that patient may have weighed with O2 tank in hand, but can't remember.  Nurse had patient weigh after eating lunch with clothes on and weight was the same as her elevated weight this am.  Nurse will reweigh patient tomorrow morning correctly and let us know update.  Renee Pain

## 2015-06-01 ENCOUNTER — Telehealth (HOSPITAL_COMMUNITY): Payer: Self-pay

## 2015-06-01 ENCOUNTER — Other Ambulatory Visit (HOSPITAL_COMMUNITY): Payer: Self-pay | Admitting: Internal Medicine

## 2015-06-01 NOTE — Telephone Encounter (Signed)
Juliann Pulse RN with [facility] called to report patient's weight has decreased since the other reports that were called in the previous 2 days to 112 lbs.  Patient does have some fine crackles in bases with deep breathes, but is not SOB and otherwise feels fine.  RN reports her belly seems slightly distended and has BLEE that is at baseline.  RN will reiterate close fluid and salt restrictions and elevating feet while in her recliner.  Will continue to monitor patient closely.  Renee Pain

## 2015-06-05 ENCOUNTER — Encounter (HOSPITAL_COMMUNITY): Payer: Self-pay | Admitting: Emergency Medicine

## 2015-06-05 ENCOUNTER — Emergency Department (EMERGENCY_DEPARTMENT_HOSPITAL): Payer: Medicare Other

## 2015-06-05 ENCOUNTER — Ambulatory Visit (HOSPITAL_BASED_OUTPATIENT_CLINIC_OR_DEPARTMENT_OTHER)
Admission: RE | Admit: 2015-06-05 | Discharge: 2015-06-05 | Disposition: A | Discharge status: Home / Self Care | Payer: Medicare Other | Source: Ambulatory Visit | Attending: Cardiology | Admitting: Cardiology

## 2015-06-05 ENCOUNTER — Encounter (HOSPITAL_COMMUNITY): Payer: Medicare Other

## 2015-06-05 ENCOUNTER — Emergency Department (HOSPITAL_COMMUNITY)
Admission: EM | Admit: 2015-06-05 | Discharge: 2015-06-05 | Disposition: A | Discharge status: Home / Self Care | Payer: Medicare Other | Attending: Emergency Medicine | Admitting: Emergency Medicine

## 2015-06-05 VITALS — BP 98/50 | HR 73 | Wt 116.3 lb

## 2015-06-05 DIAGNOSIS — R609 Edema, unspecified: Secondary | ICD-10-CM | POA: Diagnosis not present

## 2015-06-05 DIAGNOSIS — I5022 Chronic systolic (congestive) heart failure: Secondary | ICD-10-CM | POA: Diagnosis not present

## 2015-06-05 DIAGNOSIS — N189 Chronic kidney disease, unspecified: Secondary | ICD-10-CM | POA: Diagnosis not present

## 2015-06-05 DIAGNOSIS — Z79899 Other long term (current) drug therapy: Secondary | ICD-10-CM | POA: Insufficient documentation

## 2015-06-05 DIAGNOSIS — I252 Old myocardial infarction: Secondary | ICD-10-CM | POA: Insufficient documentation

## 2015-06-05 DIAGNOSIS — R52 Pain, unspecified: Secondary | ICD-10-CM | POA: Diagnosis not present

## 2015-06-05 DIAGNOSIS — L988 Other specified disorders of the skin and subcutaneous tissue: Secondary | ICD-10-CM | POA: Insufficient documentation

## 2015-06-05 DIAGNOSIS — E785 Hyperlipidemia, unspecified: Secondary | ICD-10-CM | POA: Insufficient documentation

## 2015-06-05 DIAGNOSIS — Z87440 Personal history of urinary (tract) infections: Secondary | ICD-10-CM | POA: Diagnosis not present

## 2015-06-05 DIAGNOSIS — I509 Heart failure, unspecified: Secondary | ICD-10-CM | POA: Insufficient documentation

## 2015-06-05 DIAGNOSIS — I998 Other disorder of circulatory system: Secondary | ICD-10-CM | POA: Diagnosis not present

## 2015-06-05 DIAGNOSIS — R209 Unspecified disturbances of skin sensation: Secondary | ICD-10-CM

## 2015-06-05 DIAGNOSIS — I129 Hypertensive chronic kidney disease with stage 1 through stage 4 chronic kidney disease, or unspecified chronic kidney disease: Secondary | ICD-10-CM | POA: Diagnosis not present

## 2015-06-05 DIAGNOSIS — N184 Chronic kidney disease, stage 4 (severe): Secondary | ICD-10-CM | POA: Diagnosis not present

## 2015-06-05 DIAGNOSIS — I739 Peripheral vascular disease, unspecified: Secondary | ICD-10-CM

## 2015-06-05 DIAGNOSIS — R011 Cardiac murmur, unspecified: Secondary | ICD-10-CM | POA: Diagnosis not present

## 2015-06-05 DIAGNOSIS — Z7901 Long term (current) use of anticoagulants: Secondary | ICD-10-CM | POA: Diagnosis not present

## 2015-06-05 DIAGNOSIS — R208 Other disturbances of skin sensation: Secondary | ICD-10-CM | POA: Insufficient documentation

## 2015-06-05 LAB — BASIC METABOLIC PANEL
Anion gap: 13 (ref 5–15)
BUN: 93 mg/dL — AB (ref 6–20)
CALCIUM: 8.4 mg/dL — AB (ref 8.9–10.3)
CHLORIDE: 97 mmol/L — AB (ref 101–111)
CO2: 26 mmol/L (ref 22–32)
CREATININE: 3.76 mg/dL — AB (ref 0.44–1.00)
GFR calc non Af Amer: 10 mL/min — ABNORMAL LOW (ref >60)
GFR, EST AFRICAN AMERICAN: 11 mL/min — AB (ref >60)
GLUCOSE: 123 mg/dL — AB (ref 65–99)
Potassium: 4.3 mmol/L (ref 3.5–5.1)
Sodium: 136 mmol/L (ref 135–145)

## 2015-06-05 LAB — CBC WITH DIFFERENTIAL/PLATELET
BASOS PCT: 1 %
Basophils Absolute: 0.1 10*3/uL (ref 0.0–0.1)
Eosinophils Absolute: 0.4 10*3/uL (ref 0.0–0.7)
Eosinophils Relative: 4 %
HEMATOCRIT: 33 % — AB (ref 36.0–46.0)
Hemoglobin: 11 g/dL — ABNORMAL LOW (ref 12.0–15.0)
LYMPHS ABS: 0.5 10*3/uL — AB (ref 0.7–4.0)
Lymphocytes Relative: 5 %
MCH: 30.9 pg (ref 26.0–34.0)
MCHC: 33.3 g/dL (ref 30.0–36.0)
MCV: 92.7 fL (ref 78.0–100.0)
MONO ABS: 0.6 10*3/uL (ref 0.1–1.0)
MONOS PCT: 5 %
NEUTROS ABS: 9.3 10*3/uL — AB (ref 1.7–7.7)
Neutrophils Relative %: 85 %
Platelets: 282 10*3/uL (ref 150–400)
RBC: 3.56 MIL/uL — ABNORMAL LOW (ref 3.87–5.11)
RDW: 15.2 % (ref 11.5–15.5)
WBC: 10.8 10*3/uL — ABNORMAL HIGH (ref 4.0–10.5)

## 2015-06-05 LAB — I-STAT CG4 LACTIC ACID, ED: Lactic Acid, Venous: 1.57 mmol/L (ref 0.5–2.0)

## 2015-06-05 NOTE — ED Notes (Addendum)
Pt sent from PCP to r/o blood clot in left leg. pts left foot cool to touch and redness in foot and ankle. Pt has sensation in tact. Daughter states pain and redness started on Sunday. Pedal pulse positive with doppler. Dr.wentz in triage to see pt.

## 2015-06-05 NOTE — Progress Notes (Signed)
Patient ID: Leah Kramer, female   DOB: 1923/12/25, 82 y.o.   MRN: [id number] PCP: Dr Herma Ard   HPI: 82 year old female with history of heart failure, chronic kidney disease, hypertension, diabetes, CAD.  Per Care Everywhere she presented to [facility] in October with late STEMI. Treated medically. EF 20% and severe MR. Has been struggling with volume management in setting on chronic renal failure.   TTE 03/2014 [facility]): Severe LV dysfunction (EF 20%) with basal inferior, basal anterospetal, and basal anterior hypokinesis, dilated LV, diastolic dysfunction, elevated LV filling pressures, degen MV with severe MR, dilated LA, moderate AR, severe pulmonary hypertension, normal RV function, mild TR, elevated CVP  Admitted 12/4 with respiratory failure due to recurrent HF.  Required short tem milrionone and diuresed with IV lasix.She transitioned to torsemide.  She had short episode of A fib and converted spontaneously. Discharge weight was 117 pounds.   Admitted 11/29 through 05/11/2015 with cellulitis and volume overload. Started was diuresed with IV lasix and later milrinone 0.25 mcg was added due to worsening renal function and low output heart failure. Due to PSVT milrinone was stopped. Unfortunately mixed venous saturations dropped so milrinone was restarted at 0.125 mcg and she was started on amio 200 mg twice a day. Mixed venous saturation improved on milrinone 0.125 mcg. She maintained NSR on lower dose of milrinone and with addition of amiodarone. Treated with vanc and to kelfex to complete 12 day course. Discharge weight was 113 pounds.   She returns for  follow up with her daughter and husband.  Remains on milrinone. Weight at home trending up from 108 to 115 pounds. She has had metolazone twice a week. Appetite improved.  Ongoing dyspnea exertion. Increased leg edema. Requires assistance dressing. [facility] following for HHPT/HHRN.  Living with her daughter.  Her left foot is very cold, this was not  noted at prior appointment.  Some aching in both legs, not particularly worse on left.   Labs 5/16 K 3.5, Cr 2.77 Labs 12/19/2014: K 3.8 Creatinine 2.42  Labs 05/11/2015: 3.3 Creatinine 2.6   ECHO 09/07/2014 EF 20% Grade II DD Mod Severe MR.   ROS: All systems negative except as listed in HPI, PMH and Problem List.  Past Medical History  Diagnosis Date  . CKD (chronic kidney disease)   . CHF (congestive heart failure) ([place])   . STEMI (ST elevation myocardial infarction) ([place])   . Hypertension   . Diabetes mellitus without complication ([place])   . Difficulty walking   . Overactive bladder   . Hyperlipidemia     Current Outpatient Prescriptions  Medication Sig Dispense Refill  . acetaminophen (TYLENOL) 325 MG tablet Take 650 mg by mouth every 6 (six) hours as needed for mild pain.    Marland Kitchen allopurinol (ZYLOPRIM) 100 MG tablet Take 1 tablet (100 mg total) by mouth daily. 30 tablet 6  . amiodarone (PACERONE) 200 MG tablet Take 1 tablet (200 mg total) by mouth 2 (two) times daily. 60 tablet 6  . apixaban (ELIQUIS) 2.5 MG TABS tablet Take 1 tablet (2.5 mg total) by mouth 2 (two) times daily. 180 tablet 3  . calcium carbonate (TUMS - DOSED IN MG ELEMENTAL CALCIUM) 500 MG chewable tablet Chew 1 tablet by mouth 3 (three) times daily as needed for indigestion or heartburn.    . calcium-vitamin D (OSCAL WITH D) 500-200 MG-UNIT tablet Take 1 tablet by mouth daily with breakfast.    . cholecalciferol (VITAMIN D) 400 UNITS TABS tablet Take 400 Units  by mouth at bedtime.     . cyanocobalamin (,VITAMIN B-12,) 1000 MCG/ML injection Inject 1,000 mcg into the muscle every 30 (thirty) days. Takes on the 20th of every month    . diclofenac sodium (VOLTAREN) 1 % GEL Apply 4 g topically 4 (four) times daily as needed (pain).    Marland Kitchen metolazone (ZAROXOLYN) 2.5 MG tablet Take 1 tablet (2.5 mg total) by mouth as needed. Take as needed for weight 116 pounds. No more than twice a week 8 tablet 3  . milrinone (PRIMACOR) 20  MG/100ML SOLN infusion Inject 6.2125 mcg/min into the vein continuous. 100 mL 12  . omeprazole (PRILOSEC) 20 MG capsule Take 20 mg by mouth daily.     Marland Kitchen oxybutynin (DITROPAN-XL) 10 MG 24 hr tablet Take 1 tablet (10 mg total) by mouth every morning. 30 tablet 0  . polyethylene glycol (MIRALAX / GLYCOLAX) packet Take 17 g by mouth daily as needed for mild constipation.     . potassium chloride SA (K-DUR,KLOR-CON) 20 MEQ tablet Take 1 tablet (20 mEq total) by mouth daily. Take additional 65meq when you take Metolazone 45 tablet 3  . simvastatin (ZOCOR) 20 MG tablet Take 1 tablet (20 mg total) by mouth daily. 30 tablet 2  . torsemide (DEMADEX) 20 MG tablet Take 4 tabs (80 mg) in the AM and take 3 tabs (60 mg ) in the PM 210 tablet 3  . vitamin E 400 UNIT capsule Take 400 Units by mouth at bedtime.     . nitroGLYCERIN (NITROSTAT) 0.4 MG SL tablet Place 1 tablet (0.4 mg total) under the tongue every 5 (five) minutes as needed for chest pain. (Patient not taking: Reported on 05/16/2015) 25 tablet 2   No current facility-administered medications for this encounter.     PHYSICAL EXAM: Filed Vitals:   06/05/15 1441  BP: 98/50  Pulse: 73  Weight: 116 lb 4.8 oz (52.753 kg)  SpO2: 93%    General:  Chronically ill appearing. No resp difficulty. Daughter and husband present. Arrived in wheel chair.  HEENT: normal Neck: supple. JVP to 10-11 cm. Carotids 2+ bilaterally; no bruits. No lymphadenopathy or thryomegaly appreciated. Cor: PMI normal. Regular rate & rhythm. No rubs, gallops.  2/6 early SEM RUSB.  Unable to palpate PT or DP pulses bilaterally.  Lungs: Clear decreased in the bases. .  Abdomen: soft, nontender, distended. No hepatosplenomegaly. No bruits or masses. Good bowel sounds. Extremities:  L Foot cold. R Foot warm.  Left foot purple in color distally (worse than right foot).  R and LLE 2+ edema. RUE PICC with bloody exudate noted under the dressing.  Neuro: alert & orientedx3, cranial  nerves grossly intact. Moves all 4 extremities w/o difficulty. Affect pleasant.  ASSESSMENT & PLAN:  1. Chronic Systolic Heart Failure: Ischemic cardiomyopathy, EF 20% ECHO 4/16.  She is not a candidate for advanced therapies due to her age.  On chronic home milrinone 0.125 mcg. She has NYHA class III symptoms.  Has narrow euvolemic window. Home weight goal 105-107 pounds.  Weight up on our scale today. Volume status mildly elevated.  - Increase torsemide to 80 mg bid and check BMET. Continue metolazone as needed.  - No BB with low output. No hydralazine/imdur.  - No ACEI/ARB/spironolactone with CKD.  - Blood drainage noted around PICC RUE. I called [facility] and asked that she receive a PRN visit today for PICC line dressing. BMET per [facility] on a weekly basis.   2. CAD: S/P STEMI at [facility]  in 10/15.  Late-presenting, no intervention.  Currently no chest pain.  - On apixaban 2.5 mg twice a day so no aspirin.  Continue statin. No bb with low output.  3. Paroxysmal atrial fibrillation: Regular rhythm. Continue apixaban at reduced dose with age, weight, and creatinine. No bleeding problems. .   4. Gout- On allopurinol daily.  5. Cellulitis- Completed antibiotic course for RLE cellulitis.  6. PAD- Cold left foot, marked difference compared to right and change from prior exam.  Concern for ischemic foot.  I think that she should get arterial and venous dopplers today, to get this will need to go through the ER.     Follow up in 2 weeks.     [name] Clegg NP-C  06/05/2015   Patient seen with NP, agree with the above note.    She is at least mildly volume overloaded on exam, creatinine has been stable (CKD IV).  Agree with increase in torsemide to 80 mg bid, will need to follow creatinine closely.   Left foot is cold, marked difference from right, with purplish discoloration of toes (right foot also with cyanotic changes but not as bad as left).  Edema bilateral lower legs.  I am concerned for possible ischemic foot  on left.  I think that she needs arterial and venous doppler evaluation today.  It looks like the only way to get this is going to be through the ER, will arrange to have her taken to ER for evaluation.   Loralie Champagne 06/05/2015 3:29 PM

## 2015-06-05 NOTE — ED Provider Notes (Signed)
  Face-to-face evaluation   History: Sent for evaluation of arterial insufficiency left foot.   Physical exam: Left foot is cool and sensate. There is mild swelling of the left foot. There is erythema and some purple discoloration just proximal to the toes. The right foot is somewhat reddened. There is mild bilateral lower extremity edema over the shins, left greater than right.  Medical screening examination/treatment/procedure(s) were conducted as a shared visit with non-physician practitioner(s) and myself.  I personally evaluated the patient during the encounter  Daleen Bo, MD 06/06/15 1250

## 2015-06-05 NOTE — Discharge Instructions (Signed)
Use mild dry heat to the feet, bilaterally, as needed. If they feel cold. Continue usual dressing changes. Follow-up with your cardiologist, as scheduled, and as needed.    Heat Therapy Heat therapy can help ease sore, stiff, injured, and tight muscles and joints. Heat relaxes your muscles, which may help ease your pain.  RISKS AND COMPLICATIONS If you have any of the following conditions, do not use heat therapy unless your health care provider has approved:  Poor circulation.  Healing wounds or scarred skin in the area being treated.  Diabetes, heart disease, or high blood pressure.  Not being able to feel (numbness) the area being treated.  Unusual swelling of the area being treated.  Active infections.  Blood clots.  Cancer.  Inability to communicate pain. This may include young children and people who have problems with their brain function (dementia).  Pregnancy. Heat therapy should only be used on old, pre-existing, or long-lasting (chronic) injuries. Do not use heat therapy on new injuries unless directed by your health care provider. HOW TO USE HEAT THERAPY There are several different kinds of heat therapy, including:  Moist heat pack.  Warm water bath.  Hot water bottle.  Electric heating pad.  Heated gel pack.  Heated wrap.  Electric heating pad. Use the heat therapy method suggested by your health care provider. Follow your health care provider's instructions on when and how to use heat therapy. GENERAL HEAT THERAPY RECOMMENDATIONS  Do not sleep while using heat therapy. Only use heat therapy while you are awake.  Your skin may turn pink while using heat therapy. Do not use heat therapy if your skin turns red.  Do not use heat therapy if you have new pain.  High heat or long exposure to heat can cause burns. Be careful when using heat therapy to avoid burning your skin.  Do not use heat therapy on areas of your skin that are already irritated,  such as with a rash or sunburn. SEEK MEDICAL CARE IF:  You have blisters, redness, swelling, or numbness.  You have new pain.  Your pain is worse. MAKE SURE YOU:  Understand these instructions.  Will watch your condition.  Will get help right away if you are not doing well or get worse.   This information is not intended to replace advice given to you by your health care provider. Make sure you discuss any questions you have with your health care provider.   Document Released: 08/11/2011 Document Revised: 06/09/2014 Document Reviewed: 07/12/2013 Elsevier Interactive Patient Education [employer].

## 2015-06-05 NOTE — Progress Notes (Signed)
VASCULAR LAB PRELIMINARY  ARTERIAL  ABI completed:    RIGHT    LEFT    PRESSURE WAVEFORM  PRESSURE WAVEFORM  BRACHIAL Not obtained due to IV line mid upper arm Triphasic BRACHIAL 113 Triphasic  DP 85 Monophasic DP 171 Triphasic  PT 121 Monophasic PT 176 Triphasic    RIGHT LEFT  ABI 1.07 1.56   Bilateral ABIs indicate normal arterial flow however there are abnormal Doppler waveforms on the right which might suggest a possible false elevation of pressure and ABI on the right.  Jett Fukuda, RVS 06/05/2015, 7:18 PM

## 2015-06-05 NOTE — Progress Notes (Signed)
VASCULAR LAB PRELIMINARY  PRELIMINARY  PRELIMINARY  PRELIMINARY  Bilateral lower extremity venous duplex completed.    Preliminary report:  Bilateral:  No evidence of DVT, superficial thrombosis, or Baker's Cyst. Moderate interstitial fluid noted throughout bilaterally.   Jamese Trauger, RVS 06/05/2015, 5:25 PM

## 2015-06-05 NOTE — ED Provider Notes (Signed)
MSE was initiated and I personally evaluated the patient and placed orders (if any) at  3:47 PM on June 05, 2015.  The patient appears stable so that the remainder of the MSE may be completed by another provider.  Subjective: 82 year old female sent in from the heart failure clinic for evaluation of the left lower extremity. She has a history of heart failure, venous stasis edema, previous cellulitis. The patient has bilateral lower extremity edema. She complains of pain in her right foot. She seems to have decreased sensation on the L.   Objective: BL lower extremity L >R. left foot is purple and very cold to the touch. Bilateral pitting edema, signs of chronic venous stasis changes of the skin. She is able to wiggle her toes. Areas of black skin on the left foot and his toenails. Multiple open wounds on bilateral extremities.   A/P: I have ordered an EKG, CBC, BMP, lactate. Patient is anticoagulated on L O[place] said no. PT-INR ordered. Patient also received both of arterial and venous duplex evaluation of the bilateral lower extremities. I have concern for acute arterial occlusion of the left lower extremity.  Margarita Mail, PA-C 06/06/15 0140  Daleen Bo, MD 06/06/15 1249

## 2015-06-05 NOTE — ED Notes (Signed)
Pt transported to vascular.  °

## 2015-06-05 NOTE — Patient Instructions (Signed)
Your physician recommends that you schedule a follow-up appointment in: 2 weeks \ Do the following things EVERYDAY: 1) Weigh yourself in the morning before breakfast. Write it down and keep it in a log. 2) Take your medicines as prescribed 3) Eat low salt foods-Limit salt (sodium) to 2000 mg per day.  4) Stay as active as you can everyday 5) Limit all fluids for the day to less than 2 liters 6)

## 2015-06-05 NOTE — ED Provider Notes (Signed)
CSN: [id number]     Arrival date & time 06/05/15  1519 History   First MD Initiated Contact with Patient 06/05/15 1631     Chief Complaint  Patient presents with  . DVT     (Consider location/radiation/quality/duration/timing/severity/associated sxs/prior Treatment) HPI   Leah Kramer is a 82 y.o. female who presents for evaluation of cold left foot. This was noticed today, at her routine cardiology follow-up appointment in the heart failure clinic. Also while there, they recommended that she increase her torsemide to 80 mg twice a day. She is being followed at home by nursing, to manage wounds on her bilateral ankles. There is been no recent fever, chills, nausea, vomiting, change in chest or lung symptoms. She's been eating well. She is here with family members. There are no other no modifying factors.   Past Medical History  Diagnosis Date  . CKD (chronic kidney disease)   . CHF (congestive heart failure) ([place])   . STEMI (ST elevation myocardial infarction) ([place])   . Hypertension   . Diabetes mellitus without complication ([place])   . Difficulty walking   . Overactive bladder   . Hyperlipidemia    Past Surgical History  Procedure Laterality Date  . Tonsillectomy     No family history on file. Social History  Substance Use Topics  . Smoking status: Never Smoker   . Smokeless tobacco: Never Used  . Alcohol Use: No   OB History    No data available     Review of Systems  All other systems reviewed and are negative.     Allergies  Lipitor and Sulfa antibiotics  Home Medications   Prior to Admission medications   Medication Sig Start Date End Date Taking? Authorizing Provider  acetaminophen (TYLENOL) 325 MG tablet Take 650 mg by mouth every 6 (six) hours as needed for mild pain.    Historical Provider, MD  allopurinol (ZYLOPRIM) 100 MG tablet Take 1 tablet (100 mg total) by mouth daily. 05/11/15   [name] D Ninfa Meeker, NP  amiodarone (PACERONE) 200 MG tablet Take 1 tablet  (200 mg total) by mouth 2 (two) times daily. 05/11/15   [name] D Ninfa Meeker, NP  apixaban (ELIQUIS) 2.5 MG TABS tablet Take 1 tablet (2.5 mg total) by mouth 2 (two) times daily. 04/17/15   Jolaine Artist, MD  calcium carbonate (TUMS - DOSED IN MG ELEMENTAL CALCIUM) 500 MG chewable tablet Chew 1 tablet by mouth 3 (three) times daily as needed for indigestion or heartburn.    Historical Provider, MD  calcium-vitamin D (OSCAL WITH D) 500-200 MG-UNIT tablet Take 1 tablet by mouth daily with breakfast.    Historical Provider, MD  cholecalciferol (VITAMIN D) 400 UNITS TABS tablet Take 400 Units by mouth at bedtime.     Historical Provider, MD  cyanocobalamin (,VITAMIN B-12,) 1000 MCG/ML injection Inject 1,000 mcg into the muscle every 30 (thirty) days. Takes on the 20th of every month    Historical Provider, MD  diclofenac sodium (VOLTAREN) 1 % GEL Apply 4 g topically 4 (four) times daily as needed (pain).    Historical Provider, MD  metolazone (ZAROXOLYN) 2.5 MG tablet Take 1 tablet (2.5 mg total) by mouth as needed. Take as needed for weight 116 pounds. No more than twice a week 05/11/15   [name] D Clegg, NP  milrinone (PRIMACOR) 20 MG/100ML SOLN infusion Inject 6.2125 mcg/min into the vein continuous. 05/11/15   [name] D Ninfa Meeker, NP  nitroGLYCERIN (NITROSTAT) 0.4 MG SL tablet Place 1 tablet (  0.4 mg total) under the tongue every 5 (five) minutes as needed for chest pain. Patient not taking: Reported on 05/16/2015 03/05/15   Larey Dresser, MD  omeprazole (PRILOSEC) 20 MG capsule Take 20 mg by mouth daily.     Historical Provider, MD  oxybutynin (DITROPAN-XL) 10 MG 24 hr tablet Take 1 tablet (10 mg total) by mouth every morning. 07/03/14   Jolaine Artist, MD  polyethylene glycol (MIRALAX / GLYCOLAX) packet Take 17 g by mouth daily as needed for mild constipation.     Historical Provider, MD  potassium chloride SA (K-DUR,KLOR-CON) 20 MEQ tablet Take 1 tablet (20 mEq total) by mouth daily. Take additional 51meq when you  take Metolazone 04/17/15   [name] D Clegg, NP  simvastatin (ZOCOR) 20 MG tablet Take 1 tablet (20 mg total) by mouth daily. 07/03/14   Jolaine Artist, MD  torsemide (DEMADEX) 20 MG tablet Take 4 tabs (80 mg) in the AM and take 3 tabs (60 mg ) in the PM 05/16/15   [name] D Clegg, NP  vitamin E 400 UNIT capsule Take 400 Units by mouth at bedtime.     Historical Provider, MD   BP 97/85 mmHg  Pulse 70  Temp(Src) 97.3 F (36.3 C) (Oral)  Resp 20  Wt 116 lb (52.617 kg)  SpO2 93%  LMP  (LMP Unknown) Physical Exam  Constitutional: She appears well-developed. No distress.  Elderly, frail   HENT:  Head: Normocephalic and atraumatic.  Right Ear: External ear normal.  Left Ear: External ear normal.  Eyes: Conjunctivae and EOM are normal. Pupils are equal, round, and reactive to light.  Neck: Normal range of motion and phonation normal. Neck supple.  Cardiovascular: Normal rate, regular rhythm and normal heart sounds.   Systolic murmur is present.  Pulmonary/Chest: Effort normal and breath sounds normal. She exhibits no bony tenderness.  Abdominal: Soft. There is no tenderness.  Musculoskeletal: Normal range of motion. She exhibits edema (Left greater than right lower legs).  Left foot, purple discoloration, cool to touch, beneath the ankle. Left foot is sensate. The left foot is nontender to palpation.  Neurological: She is alert. No cranial nerve deficit or sensory deficit. She exhibits normal muscle tone. Coordination normal.  Skin: Skin is warm, dry and intact.  There is a small blister the left anterior ankle. There is no associated drainage. There are chronic appearing healing wounds of the right posterior and medial ankles. There is no drainage from either of these wounds.  Psychiatric: She has a normal mood and affect. Her behavior is normal.  Nursing note and vitals reviewed.   ED Course  Procedures (including critical care time)  Medications - No data to display  Patient Vitals for  the past 24 hrs:  BP Temp Temp src Pulse Resp SpO2 Weight  06/05/15 2015 97/85 mmHg - - 70 - 93 % -  06/05/15 1945 (!) 106/53 mmHg - - 70 - 91 % -  06/05/15 1930 108/58 mmHg - - 71 20 91 % -  06/05/15 1915 (!) 110/51 mmHg - - 70 - 92 % -  06/05/15 1900 108/57 mmHg - - 73 - 92 % -  06/05/15 1638 (!) 109/52 mmHg - - 72 16 98 % -  06/05/15 1529 (!) 106/52 mmHg 97.3 F (36.3 C) Oral - 16 97 % 116 lb (52.617 kg)    Venous Doppler negative for DVT, bilaterally  Arterial Doppler negative for arterial occlusion, bilateral feet. Of note, there is mild  right-sided arterial disease, in the right leg   8:18 PM Reevaluation with update and discussion. After initial assessment and treatment, an updated evaluation reveals the feet are pink bilaterally. They appear symmetric in color. Both feet remain sensate and nontender to palpation. Findings discussed with patient as well as her family members who were in the room. All questions were answered. Ollin Hochmuth L    Labs Review Labs Reviewed  CBC WITH DIFFERENTIAL/PLATELET - Abnormal; Notable for the following:    WBC 10.8 (*)    RBC 3.56 (*)    Hemoglobin 11.0 (*)    HCT 33.0 (*)    Neutro Abs 9.3 (*)    Lymphs Abs 0.5 (*)    All other components within normal limits  BASIC METABOLIC PANEL - Abnormal; Notable for the following:    Chloride 97 (*)    Glucose, Bld 123 (*)    BUN 93 (*)    Creatinine, Ser 3.76 (*)    Calcium 8.4 (*)    GFR calc non Af Amer 10 (*)    GFR calc Af Amer 11 (*)    All other components within normal limits  I-STAT CG4 LACTIC ACID, ED    Imaging Review No results found. I have personally reviewed and evaluated these images and lab results as part of my medical decision-making.   EKG Interpretation   Date/Time:  Tuesday June 05 2015 16:21:04 EST Ventricular Rate:  72 PR Interval:  208 QRS Duration: 122 QT Interval:  462 QTC Calculation: 505 R Axis:   -59 Text Interpretation:  Normal sinus rhythm  Right bundle branch block Left  anterior fascicular block ** Bifascicular block ** T wave abnormality,  consider lateral ischemia Abnormal ECG Since last tracing Right bundle  branch block is new Confirmed by Tomasita Beevers  MD, Cornisha Zetino [id number]) on 06/05/2015  4:30:43 PM      MDM   Final diagnoses:  Cold left foot    Cold Foot without evidence for associated DVT, or arterial occlusion. Doubt cellulitis, fracture, radiculopathy.  Nursing Notes Reviewed/ Care Coordinated Applicable Imaging Reviewed Interpretation of Laboratory Data incorporated into ED treatment  The patient appears reasonably screened and/or stabilized for discharge and I doubt any other medical condition or other [facility] requiring further screening, evaluation, or treatment in the ED at this time prior to discharge.  Plan: Home Medications- usual, increase Torsemide; Home Treatments- rest; return here if the recommended treatment, does not improve the symptoms; Recommended follow up- Cardiology as scheduled   Daleen Bo, MD 06/05/15 2025

## 2015-06-05 NOTE — Progress Notes (Signed)
Advanced Heart Failure Medication Review by a Pharmacist  Does the patient  feel that his/her medications are working for him/her?  yes  Has the patient been experiencing any side effects to the medications prescribed?  no  Does the patient measure his/her own blood pressure or blood glucose at home?  yes   Does the patient have any problems obtaining medications due to transportation or finances?   no  Understanding of regimen: good Understanding of indications: good Potential of compliance: good Patient understands to avoid NSAIDs. Patient understands to avoid decongestants.  Issues to address at subsequent visits: None   Pharmacist comments: Leah Kramer is a pleasant 82 yo F presenting with her husband and daughter. Her daughter manages her medications and is able to verbalize her regimen to me including dosages. She states that [facility] has still not sent them her Eliquis. I will call today to see what the issue is and provide her with some samples to get her through the next couple of weeks.   [name]. [name], PharmD, BCPS, CPP Clinical Pharmacist Pager: [phone] Phone: [phone] 06/05/2015 2:51 PM      Time with patient: 10 minutes Preparation and documentation time: 20 minutes Total time: 30 minutes

## 2015-06-05 NOTE — Progress Notes (Signed)
VASCULAR LAB PRELIMINARY  PRELIMINARY  PRELIMINARY  PRELIMINARY  Bilateral lower extremity arterial duplex completed.    Preliminary report:  Bilateral  - There is mild to moderate irregular calcific plaque throughout the common femoral artery with no evidence of significant stenosis. Possible  aortoiliac or iliac disease right being greater than left.There is no evidence of significant plaque noted throughout the remainder of the lower extremity bilaterally. The right lower extremity demonstrates monophasic Doppler waveforms throughout with the left being biphasic to triphasic  Yeudiel Mateo, RVS 06/05/2015, 7:22 PM

## 2015-06-06 ENCOUNTER — Telehealth (HOSPITAL_COMMUNITY): Payer: Self-pay | Admitting: Pharmacist

## 2015-06-06 NOTE — Telephone Encounter (Signed)
Spoke with [facility] patient assistance who states that they just need the patient to call and verify address but that the Eliquis is ready to be shipped today. I called [name], patient's daughter, who states she will call today [phone]).   [name]. [name], PharmD, BCPS, CPP Clinical Pharmacist Pager: [phone] Phone: [phone] 06/06/2015 11:29 AM

## 2015-06-12 ENCOUNTER — Encounter: Payer: Self-pay | Admitting: Internal Medicine

## 2015-06-13 ENCOUNTER — Telehealth (HOSPITAL_COMMUNITY): Payer: Self-pay

## 2015-06-13 NOTE — Telephone Encounter (Signed)
Freda Munro RN with [facility] called to report patients legs weeping bilaterally.  States this has been an issue off and on.  Took metolazone but feels like urine output is decreasing each week, and only urinated twice that day when she took metolazone.  Will apply dry gauze with ace to left leg and ABD pad with ace to right leg.  No SOB noted, otherwise feels ok, just "ant get comfortable." Patient has apt with [place] next week.  Will add on BMET.  Renee Pain

## 2015-06-20 ENCOUNTER — Encounter: Payer: Self-pay | Admitting: Internal Medicine

## 2015-06-20 ENCOUNTER — Ambulatory Visit (HOSPITAL_COMMUNITY)
Admission: RE | Admit: 2015-06-20 | Discharge: 2015-06-20 | Disposition: A | Discharge status: Home / Self Care | Payer: Medicare Other | Source: Ambulatory Visit | Attending: Cardiology | Admitting: Cardiology

## 2015-06-20 VITALS — BP 100/50 | HR 65 | Wt 121.0 lb

## 2015-06-20 DIAGNOSIS — I2583 Coronary atherosclerosis due to lipid rich plaque: Secondary | ICD-10-CM

## 2015-06-20 DIAGNOSIS — I5022 Chronic systolic (congestive) heart failure: Secondary | ICD-10-CM | POA: Diagnosis not present

## 2015-06-20 DIAGNOSIS — L0390 Cellulitis, unspecified: Secondary | ICD-10-CM | POA: Diagnosis not present

## 2015-06-20 DIAGNOSIS — I480 Paroxysmal atrial fibrillation: Secondary | ICD-10-CM

## 2015-06-20 DIAGNOSIS — I2510 Atherosclerotic heart disease of native coronary artery without angina pectoris: Secondary | ICD-10-CM | POA: Diagnosis not present

## 2015-06-20 DIAGNOSIS — I5023 Acute on chronic systolic (congestive) heart failure: Secondary | ICD-10-CM | POA: Diagnosis not present

## 2015-06-20 DIAGNOSIS — I739 Peripheral vascular disease, unspecified: Secondary | ICD-10-CM | POA: Diagnosis not present

## 2015-06-20 DIAGNOSIS — M109 Gout, unspecified: Secondary | ICD-10-CM | POA: Diagnosis not present

## 2015-06-20 DIAGNOSIS — N184 Chronic kidney disease, stage 4 (severe): Secondary | ICD-10-CM

## 2015-06-20 MED ORDER — POTASSIUM CHLORIDE CRYS ER 20 MEQ PO TBCR
20.0000 meq | EXTENDED_RELEASE_TABLET | Freq: Once | ORAL | Status: AC
  Administered 2015-06-20: 20 meq via ORAL
  Filled 2015-06-20: qty 1

## 2015-06-20 MED ORDER — FUROSEMIDE 10 MG/ML IJ SOLN
80.0000 mg | Freq: Once | INTRAMUSCULAR | Status: AC
  Administered 2015-06-20: 80 mg via INTRAVENOUS
  Filled 2015-06-20: qty 8

## 2015-06-20 MED ORDER — FUROSEMIDE 10 MG/ML PO SOLN: 80.0000 mg | Freq: Once | ORAL | Status: AC | PRN

## 2015-06-20 MED FILL — FUROSEMIDE 10 MG/ML VIAL: 10 | 2 days supply | Qty: 16 | Fill #0

## 2015-06-20 NOTE — Progress Notes (Signed)
Pt given 80 mg IV Lasix through PICC line, pt tolerated well

## 2015-06-20 NOTE — Patient Instructions (Signed)
80 mg IV Lasix today and again on Friday, 06/22/15 your home health nurse will give you this dose  Your physician recommends that you schedule a follow-up appointment in: 1 week with Dr.Bensimhon  Do the following things EVERYDAY: 1) Weigh yourself in the morning before breakfast. Write it down and keep it in a log. 2) Take your medicines as prescribed 3) Eat low salt foods-Limit salt (sodium) to 2000 mg per day.  4) Stay as active as you can everyday 5) Limit all fluids for the day to less than 2 liters 6)

## 2015-06-20 NOTE — Progress Notes (Signed)
Patient ID: Leah Kramer, female   DOB: Aug 21, 1923, 82 y.o.   MRN: [id number] PCP: Dr Herma Ard   HPI: 82 year old female with history of heart failure, chronic kidney disease, hypertension, diabetes, CAD.  Per Care Everywhere she presented to [facility] in October with late STEMI. Treated medically. EF 20% and severe MR. Has been struggling with volume management in setting on chronic renal failure.   TTE 03/2014 [facility]): Severe LV dysfunction (EF 20%) with basal inferior, basal anterospetal, and basal anterior hypokinesis, dilated LV, diastolic dysfunction, elevated LV filling pressures, degen MV with severe MR, dilated LA, moderate AR, severe pulmonary hypertension, normal RV function, mild TR, elevated CVP  Admitted 12/4 with respiratory failure due to recurrent HF.  Required short tem milrionone and diuresed with IV lasix.She transitioned to torsemide.  She had short episode of A fib and converted spontaneously. Discharge weight was 117 pounds.   Admitted 11/29 through 05/11/2015 with cellulitis and volume overload. Started was diuresed with IV lasix and later milrinone 0.25 mcg was added due to worsening renal function and low output heart failure. Due to PSVT milrinone was stopped. Unfortunately mixed venous saturations dropped so milrinone was restarted at 0.125 mcg and she was started on amio 200 mg twice a day. Mixed venous saturation improved on milrinone 0.125 mcg. She maintained NSR on lower dose of milrinone and with addition of amiodarone. Treated with vanc and to kelfex to complete 12 day course. Discharge weight was 113 pounds.   She returns for  follow up with her daughter and husband.  Last visit she was sent to [facility] ED for suspected ischemic leg. ABI negative. Overall feeling poorly. Ongoing dyspnea with exertion.  +Orthopnea. Weight at home 116-120 pounds. Taking metolazone 1-2 times a week. Taking all medications. She wants to stay at home. [facility] following. She has labs every  Tueaday .   Labs 5/16 K 3.5, Cr 2.77 Labs 12/19/2014: K 3.8 Creatinine 2.42  Labs 05/11/2015: 3.3 Creatinine 2.6   ECHO 09/07/2014 EF 20% Grade II DD Mod Severe MR.   ROS: All systems negative except as listed in HPI, PMH and Problem List.  Past Medical History  Diagnosis Date  . CKD (chronic kidney disease)   . CHF (congestive heart failure) ([place])   . STEMI (ST elevation myocardial infarction) ([place])   . Hypertension   . Diabetes mellitus without complication ([place])   . Difficulty walking   . Overactive bladder   . Hyperlipidemia     Current Outpatient Prescriptions  Medication Sig Dispense Refill  . acetaminophen (TYLENOL) 325 MG tablet Take 650 mg by mouth every 6 (six) hours as needed for mild pain.    Marland Kitchen allopurinol (ZYLOPRIM) 100 MG tablet Take 1 tablet (100 mg total) by mouth daily. 30 tablet 6  . amiodarone (PACERONE) 200 MG tablet Take 1 tablet (200 mg total) by mouth 2 (two) times daily. 60 tablet 6  . apixaban (ELIQUIS) 2.5 MG TABS tablet Take 1 tablet (2.5 mg total) by mouth 2 (two) times daily. 180 tablet 3  . calcium carbonate (TUMS - DOSED IN MG ELEMENTAL CALCIUM) 500 MG chewable tablet Chew 1 tablet by mouth 3 (three) times daily as needed for indigestion or heartburn.    . calcium-vitamin D (OSCAL WITH D) 500-200 MG-UNIT tablet Take 1 tablet by mouth daily with breakfast.    . cholecalciferol (VITAMIN D) 400 UNITS TABS tablet Take 400 Units by mouth at bedtime.     . cyanocobalamin (,VITAMIN B-12,) 1000 MCG/ML  injection Inject 1,000 mcg into the muscle every 30 (thirty) days. Takes on the 20th of every month    . diclofenac sodium (VOLTAREN) 1 % GEL Apply 4 g topically 4 (four) times daily as needed (pain).    Marland Kitchen metolazone (ZAROXOLYN) 2.5 MG tablet Take 1 tablet (2.5 mg total) by mouth as needed. Take as needed for weight 116 pounds. No more than twice a week 8 tablet 3  . milrinone (PRIMACOR) 20 MG/100ML SOLN infusion Inject 6.2125 mcg/min into the vein continuous. 100 mL  12  . nitroGLYCERIN (NITROSTAT) 0.4 MG SL tablet Place 1 tablet (0.4 mg total) under the tongue every 5 (five) minutes as needed for chest pain. (Patient not taking: Reported on 05/16/2015) 25 tablet 2  . omeprazole (PRILOSEC) 20 MG capsule Take 20 mg by mouth daily.     Marland Kitchen oxybutynin (DITROPAN-XL) 10 MG 24 hr tablet Take 1 tablet (10 mg total) by mouth every morning. 30 tablet 0  . polyethylene glycol (MIRALAX / GLYCOLAX) packet Take 17 g by mouth daily as needed for mild constipation.     . potassium chloride SA (K-DUR,KLOR-CON) 20 MEQ tablet Take 1 tablet (20 mEq total) by mouth daily. Take additional 47meq when you take Metolazone 45 tablet 3  . simvastatin (ZOCOR) 20 MG tablet Take 1 tablet (20 mg total) by mouth daily. 30 tablet 2  . torsemide (DEMADEX) 20 MG tablet Take 4 tabs (80 mg) in the AM and take 3 tabs (60 mg ) in the PM 210 tablet 3  . vitamin E 400 UNIT capsule Take 400 Units by mouth at bedtime.      No current facility-administered medications for this encounter.     PHYSICAL EXAM: Filed Vitals:   06/20/15 1404  BP: 100/50  Pulse: 65  Weight: 121 lb (54.885 kg)  SpO2: 96%    General:  Chronically ill appearing. No resp difficulty. Daughter and husband present. Arrived in wheel chair.  HEENT: normal Neck: supple. JVP to jaw. Carotids 2+ bilaterally; no bruits. No lymphadenopathy or thryomegaly appreciated. Cor: PMI normal. Regular rate & rhythm. No rubs, gallops.  2/6 early SEM RUSB.   Lungs: Clear decreased in the bases.  Abdomen: soft, nontender, distended. No hepatosplenomegaly. No bruits or masses. Good bowel sounds. Extremities:  LLE and RLE dressing in place.  R and LLE 2+ edema. RUE PICC with bloody exudate noted under the dressing.  Neuro: alert & orientedx3, cranial nerves grossly intact. Moves all 4 extremities w/o difficulty. Affect pleasant.  ASSESSMENT & PLAN:  1. Chronic Systolic Heart Failure: Ischemic cardiomyopathy, EF 20% ECHO 4/16.  She is not a  candidate for advanced therapies due to her age.  On chronic home milrinone 0.125 mcg. She has NYHA class IIIb symptoms.  Has narrow euvolemic window. Home weight goal 105-107 pounds but we have had difficulty managing at home. Weight continues to rise.  I discussed with her and her husband and daughter that she has end stage HF and we limited options. She is not a candidate for advanced therapies due to age and increasing milrinone not an option due to the possibility of arrhythmias that could cause SCD.  Volume status elevated. Today she was given 80 mg IV lasix in the clinic and she will received 80 mg IV lasix by home health on Friday. I called [facility] to verify IV lasix could be provided. HH to check BMET later this week.  - Continue torsemide to 80 mg in am and 60  mg in pm.  Continue metolazone as needed.  - No BB with low output. No hydralazine/imdur.  - No ACEI/ARB/spironolactone with CKD.  - Blood drainage noted around PICC RUE. I called [facility] for inspection in the clinic.   BMET per [facility] on a weekly basis.   2. CAD: S/P STEMI at [facility] in 10/15.  Late-presenting, no intervention.  Currently no chest pain.  - On apixaban 2.5 mg twice a day so no aspirin.  Continue statin. No bb with low output.  3. Paroxysmal atrial fibrillation: Regular rhythm. Continue apixaban at reduced dose with age, weight, and creatinine. No bleeding problems.   4. Gout- On allopurinol daily.  5. Cellulitis- Completed antibiotic course for RLE cellulitis.  6. PAD- Last visit she was sent to [facility] ED for suspected ischemic leg.  ABI ok.      I offered hospital admit but she would like to stay at home. She needs Hospice however with inotropes that is not an option. Follow up in 1 week to reassess volume.     Tobby Fawcett NP-C  06/20/2015

## 2015-06-25 ENCOUNTER — Encounter: Payer: Self-pay | Admitting: Internal Medicine

## 2015-06-26 ENCOUNTER — Telehealth (HOSPITAL_COMMUNITY): Payer: Self-pay | Admitting: Cardiology

## 2015-06-26 NOTE — Telephone Encounter (Signed)
Cathy,PT called to request verbal orders to continue services -vo given to continue services  Shelia called after patients [place] visit to report b/p low today 85/35 HR 60 R 20 temp 97.6 Pt did complain of increased weakness  Spoke with patient and weakness is normal for her, denied SOB, dizziness, or lightheadiness Advised patient some weakness may be expected with her b/p being low, she should keep her follow up scheduled for 06/27/15 and call office is she starts to feel worse  Pt voiced understanding

## 2015-06-27 ENCOUNTER — Ambulatory Visit (HOSPITAL_COMMUNITY)
Admission: RE | Admit: 2015-06-27 | Discharge: 2015-06-27 | Disposition: A | Discharge status: Home / Self Care | Payer: Medicare Other | Source: Ambulatory Visit | Attending: Internal Medicine | Admitting: Internal Medicine

## 2015-06-27 ENCOUNTER — Encounter (HOSPITAL_COMMUNITY): Payer: Self-pay | Admitting: Internal Medicine

## 2015-06-27 VITALS — BP 100/40 | HR 70 | Wt 117.0 lb

## 2015-06-27 DIAGNOSIS — Z79899 Other long term (current) drug therapy: Secondary | ICD-10-CM | POA: Insufficient documentation

## 2015-06-27 DIAGNOSIS — M109 Gout, unspecified: Secondary | ICD-10-CM | POA: Diagnosis not present

## 2015-06-27 DIAGNOSIS — E1122 Type 2 diabetes mellitus with diabetic chronic kidney disease: Secondary | ICD-10-CM | POA: Insufficient documentation

## 2015-06-27 DIAGNOSIS — I5022 Chronic systolic (congestive) heart failure: Secondary | ICD-10-CM | POA: Insufficient documentation

## 2015-06-27 DIAGNOSIS — N179 Acute kidney failure, unspecified: Secondary | ICD-10-CM

## 2015-06-27 DIAGNOSIS — N189 Chronic kidney disease, unspecified: Secondary | ICD-10-CM | POA: Insufficient documentation

## 2015-06-27 DIAGNOSIS — I5023 Acute on chronic systolic (congestive) heart failure: Secondary | ICD-10-CM

## 2015-06-27 DIAGNOSIS — I130 Hypertensive heart and chronic kidney disease with heart failure and stage 1 through stage 4 chronic kidney disease, or unspecified chronic kidney disease: Secondary | ICD-10-CM | POA: Diagnosis not present

## 2015-06-27 DIAGNOSIS — E785 Hyperlipidemia, unspecified: Secondary | ICD-10-CM | POA: Diagnosis not present

## 2015-06-27 DIAGNOSIS — Z7902 Long term (current) use of antithrombotics/antiplatelets: Secondary | ICD-10-CM | POA: Insufficient documentation

## 2015-06-27 DIAGNOSIS — I480 Paroxysmal atrial fibrillation: Secondary | ICD-10-CM | POA: Diagnosis not present

## 2015-06-27 DIAGNOSIS — N184 Chronic kidney disease, stage 4 (severe): Secondary | ICD-10-CM | POA: Diagnosis not present

## 2015-06-27 DIAGNOSIS — I2510 Atherosclerotic heart disease of native coronary artery without angina pectoris: Secondary | ICD-10-CM | POA: Diagnosis not present

## 2015-06-27 DIAGNOSIS — I255 Ischemic cardiomyopathy: Secondary | ICD-10-CM | POA: Diagnosis not present

## 2015-06-27 DIAGNOSIS — I252 Old myocardial infarction: Secondary | ICD-10-CM | POA: Diagnosis not present

## 2015-06-27 NOTE — Progress Notes (Signed)
Patient ID: Leah Kramer, female   DOB: [birth year], 82 y.o.   MRN: [id number] PCP: Dr Herma Ard   HPI: 82 year old female with history of heart failure, chronic kidney disease, hypertension, diabetes, CAD.  Per Care Everywhere she presented to [facility] in October with late STEMI. Treated medically. EF 20% and severe MR. Has been struggling with volume management in setting on chronic renal failure.   TTE 03/2014 [facility]): Severe LV dysfunction (EF 20%) with basal inferior, basal anterospetal, and basal anterior hypokinesis, dilated LV, diastolic dysfunction, elevated LV filling pressures, degen MV with severe MR, dilated LA, moderate AR, severe pulmonary hypertension, normal RV function, mild TR, elevated CVP  Admitted 12/4 with respiratory failure due to recurrent HF.  Required short tem milrionone and diuresed with IV lasix.She transitioned to torsemide.  She had short episode of A fib and converted spontaneously. Discharge weight was 117 pounds.   Admitted 11/29 through 05/11/2015 with cellulitis and volume overload. Started was diuresed with IV lasix and later milrinone 0.25 mcg was added due to worsening renal function and low output heart failure. Due to PSVT milrinone was stopped. Unfortunately mixed venous saturations dropped so milrinone was restarted at 0.125 mcg and she was started on amio 200 mg twice a day. Mixed venous saturation improved on milrinone 0.125 mcg. She maintained NSR on lower dose of milrinone and with addition of amiodarone. Treated with vanc and to kelfex to complete 12 day course. Discharge weight was 113 pounds.   She returns for  follow up with her daughter and husband.  Last visit week she received 80 mg IV last Wednesday and Friday. Weight went down 115 pounds. Weight has been 115-117 pounds. She has not needed any metolazone. Appetite has improved. Taking benadryl every night. Remains SOB with exertion. Ongoing leg edema.  Followed by [facility]. Her family  continues to provide assistance daily.   Labs 5/16 K 3.5, Cr 2.77 Labs 12/19/2014: K 3.8 Creatinine 2.42  Labs 05/11/2015: 3.3 Creatinine 2.6  Labs 06/05/2015: Creatinine 3.76   ECHO 09/07/2014 EF 20% Grade II DD Mod Severe MR.   ROS: All systems negative except as listed in HPI, PMH and Problem List.  Past Medical History  Diagnosis Date  . CKD (chronic kidney disease)   . CHF (congestive heart failure) ([place])   . STEMI (ST elevation myocardial infarction) ([place])   . Hypertension   . Diabetes mellitus without complication ([place])   . Difficulty walking   . Overactive bladder   . Hyperlipidemia     Current Outpatient Prescriptions  Medication Sig Dispense Refill  . acetaminophen (TYLENOL) 325 MG tablet Take 650 mg by mouth every 6 (six) hours as needed for mild pain.    Marland Kitchen allopurinol (ZYLOPRIM) 100 MG tablet Take 1 tablet (100 mg total) by mouth daily. 30 tablet 6  . amiodarone (PACERONE) 200 MG tablet Take 1 tablet (200 mg total) by mouth 2 (two) times daily. 60 tablet 6  . apixaban (ELIQUIS) 2.5 MG TABS tablet Take 1 tablet (2.5 mg total) by mouth 2 (two) times daily. 180 tablet 3  . calcium carbonate (TUMS - DOSED IN MG ELEMENTAL CALCIUM) 500 MG chewable tablet Chew 1 tablet by mouth 3 (three) times daily as needed for indigestion or heartburn.    . calcium-vitamin D (OSCAL WITH D) 500-200 MG-UNIT tablet Take 1 tablet by mouth daily with breakfast.    . cholecalciferol (VITAMIN D) 400 UNITS TABS tablet Take 400 Units by mouth at bedtime.     Marland Kitchen  cyanocobalamin (,VITAMIN B-12,) 1000 MCG/ML injection Inject 1,000 mcg into the muscle every 30 (thirty) days. Takes on the 20th of every month    . diclofenac sodium (VOLTAREN) 1 % GEL Apply 4 g topically 4 (four) times daily as needed (pain). Reported on 06/20/2015    . diphenhydrAMINE (BENADRYL) 25 MG tablet Take 25 mg by mouth at bedtime as needed.    . furosemide (LASIX) 10 MG/ML solution Take 8 mLs (80 mg total) by mouth once as needed. X 2  doses 16 mL 0  . metolazone (ZAROXOLYN) 2.5 MG tablet Take 1 tablet (2.5 mg total) by mouth as needed. Take as needed for weight 116 pounds. No more than twice a week 8 tablet 3  . milrinone (PRIMACOR) 20 MG/100ML SOLN infusion Inject 6.2125 mcg/min into the vein continuous. 100 mL 12  . nitroGLYCERIN (NITROSTAT) 0.4 MG SL tablet Place 1 tablet (0.4 mg total) under the tongue every 5 (five) minutes as needed for chest pain. 25 tablet 2  . omeprazole (PRILOSEC) 20 MG capsule Take 20 mg by mouth daily.     Marland Kitchen oxybutynin (DITROPAN-XL) 10 MG 24 hr tablet Take 1 tablet (10 mg total) by mouth every morning. 30 tablet 0  . polyethylene glycol (MIRALAX / GLYCOLAX) packet Take 17 g by mouth daily as needed for mild constipation.     . potassium chloride SA (K-DUR,KLOR-CON) 20 MEQ tablet Take 1 tablet (20 mEq total) by mouth daily. Take additional 71meq when you take Metolazone 45 tablet 3  . simvastatin (ZOCOR) 20 MG tablet Take 1 tablet (20 mg total) by mouth daily. 30 tablet 2  . torsemide (DEMADEX) 20 MG tablet Take 4 tabs (80 mg) in the AM and take 3 tabs (60 mg ) in the PM (Patient taking differently: 80 mg 2 (two) times daily. Take 4 tabs (80 mg) twice a day.) 210 tablet 3  . vitamin E 400 UNIT capsule Take 400 Units by mouth at bedtime.      No current facility-administered medications for this encounter.     PHYSICAL EXAM: Filed Vitals:   06/27/15 1541  BP: 100/40  Pulse: 70  Weight: 117 lb (53.071 kg)  SpO2: 94%    General:  Chronically ill appearing. No resp difficulty. Daughter and husband present. Arrived in wheel chair.  HEENT: normal Neck: supple. JVP to jaw. Carotids 2+ bilaterally; no bruits. No lymphadenopathy or thryomegaly appreciated. Cor: PMI normal. Regular rate & rhythm. No rubs, gallops.  2/6 early SEM RUSB.   Lungs: Clear decreased in the bases.  Abdomen: soft, nontender, distended. No hepatosplenomegaly. No bruits or masses. Good bowel sounds. Extremities:  LLE and RLE  dressing in place.  R and LLE 3+ edema. RUE PICC   Neuro: alert & orientedx3, cranial nerves grossly intact. Moves all 4 extremities w/o difficulty. Affect pleasant.  ASSESSMENT & PLAN: 1. Chronic Systolic Heart Failure: Ischemic cardiomyopathy, EF 20% ECHO 4/16.  She is not a candidate for advanced therapies due to her age.  On chronic home milrinone 0.125 mcg. She has NYHA class IIIb symptoms.  Has narrow euvolemic window. Unable to push weight below 115 pounds. I discussed with her and her husband and daughter that she has end stage HF and we no other options. She is not a candidate for advanced therapies due to age and increasing milrinone not an option due to the possibility of arrhythmias that could cause SCD.  Volume status elevated. Plan to continue torsemide 80 mg twice a day. Plan  to start 80 mg IV lasix weekly.  [facility] to provide IV lasix. [facility] to give  IV lasix 80 mg weekly.  HH to check BMET later this week.  -  Continue metolazone as needed.  - No BB with low output. No hydralazine/imdur.  - No ACEI/ARB/spironolactone with CKD.  - BMET per [facility] on a weekly basis.   2. CAD: S/P STEMI at [facility] in 10/15.  Late-presenting, no intervention.  Currently no chest pain.  - On apixaban 2.5 mg twice a day so no aspirin.  Continue statin. No bb with low output.  3. Paroxysmal atrial fibrillation: Regular rhythm. Continue apixaban at reduced dose with age, weight, and creatinine. No bleeding problems.   4. Gout- On allopurinol daily.   Follow up next week. Refer to HF SW to help identify potential Hospice options.    [name] Clegg NP-C  06/27/2015   Patient seen and examined with Darrick Grinder, NP. We discussed all aspects of the encounter. I agree with the assessment and plan as stated above.   She is actively dying from end-stage HF. Volume status and renal function worsening despite milrinone support. Agree with plan as above for IV lasix to help palliate symptoms. We have made referral to hospice.    Total time spent 45 minutes. Over half that time spent discussing above.   Shakayla Hickox,MD 11:12 PM

## 2015-06-27 NOTE — Patient Instructions (Signed)
Your physician recommends that you schedule a follow-up appointment in: 1 week  Do the following things EVERYDAY: 1) Weigh yourself in the morning before breakfast. Write it down and keep it in a log. 2) Take your medicines as prescribed 3) Eat low salt foods-Limit salt (sodium) to 2000 mg per day.  4) Stay as active as you can everyday 5) Limit all fluids for the day to less than 2 liters 6)

## 2015-06-30 DIAGNOSIS — N179 Acute kidney failure, unspecified: Secondary | ICD-10-CM | POA: Insufficient documentation

## 2015-06-30 DIAGNOSIS — N184 Chronic kidney disease, stage 4 (severe): Secondary | ICD-10-CM

## 2015-07-01 ENCOUNTER — Inpatient Hospital Stay (HOSPITAL_COMMUNITY): Payer: Medicare Other | Admitting: Anesthesiology

## 2015-07-01 ENCOUNTER — Emergency Department (HOSPITAL_COMMUNITY): Payer: Medicare Other

## 2015-07-01 ENCOUNTER — Encounter (HOSPITAL_COMMUNITY): Payer: Self-pay | Admitting: Emergency Medicine

## 2015-07-01 ENCOUNTER — Inpatient Hospital Stay (HOSPITAL_COMMUNITY)
Admission: EM | Admit: 2015-07-01 | Discharge: 2015-07-04 | DRG: 291 | Disposition: E | Payer: Medicare Other | Attending: Internal Medicine | Admitting: Internal Medicine

## 2015-07-01 DIAGNOSIS — I2510 Atherosclerotic heart disease of native coronary artery without angina pectoris: Secondary | ICD-10-CM | POA: Diagnosis present

## 2015-07-01 DIAGNOSIS — J9601 Acute respiratory failure with hypoxia: Secondary | ICD-10-CM | POA: Diagnosis present

## 2015-07-01 DIAGNOSIS — Z888 Allergy status to other drugs, medicaments and biological substances status: Secondary | ICD-10-CM

## 2015-07-01 DIAGNOSIS — I5023 Acute on chronic systolic (congestive) heart failure: Secondary | ICD-10-CM | POA: Diagnosis present

## 2015-07-01 DIAGNOSIS — L03116 Cellulitis of left lower limb: Secondary | ICD-10-CM | POA: Diagnosis present

## 2015-07-01 DIAGNOSIS — I480 Paroxysmal atrial fibrillation: Secondary | ICD-10-CM | POA: Diagnosis present

## 2015-07-01 DIAGNOSIS — N3281 Overactive bladder: Secondary | ICD-10-CM | POA: Diagnosis present

## 2015-07-01 DIAGNOSIS — I252 Old myocardial infarction: Secondary | ICD-10-CM | POA: Diagnosis not present

## 2015-07-01 DIAGNOSIS — I272 Other secondary pulmonary hypertension: Secondary | ICD-10-CM | POA: Diagnosis present

## 2015-07-01 DIAGNOSIS — N184 Chronic kidney disease, stage 4 (severe): Secondary | ICD-10-CM | POA: Diagnosis present

## 2015-07-01 DIAGNOSIS — J9600 Acute respiratory failure, unspecified whether with hypoxia or hypercapnia: Secondary | ICD-10-CM | POA: Diagnosis present

## 2015-07-01 DIAGNOSIS — I255 Ischemic cardiomyopathy: Secondary | ICD-10-CM | POA: Diagnosis present

## 2015-07-01 DIAGNOSIS — I130 Hypertensive heart and chronic kidney disease with heart failure and stage 1 through stage 4 chronic kidney disease, or unspecified chronic kidney disease: Secondary | ICD-10-CM | POA: Diagnosis present

## 2015-07-01 DIAGNOSIS — E1122 Type 2 diabetes mellitus with diabetic chronic kidney disease: Secondary | ICD-10-CM | POA: Diagnosis present

## 2015-07-01 DIAGNOSIS — L03115 Cellulitis of right lower limb: Secondary | ICD-10-CM | POA: Diagnosis present

## 2015-07-01 DIAGNOSIS — N179 Acute kidney failure, unspecified: Secondary | ICD-10-CM | POA: Diagnosis present

## 2015-07-01 DIAGNOSIS — Z882 Allergy status to sulfonamides status: Secondary | ICD-10-CM | POA: Diagnosis not present

## 2015-07-01 DIAGNOSIS — E875 Hyperkalemia: Secondary | ICD-10-CM | POA: Diagnosis present

## 2015-07-01 DIAGNOSIS — L8990 Pressure ulcer of unspecified site, unspecified stage: Secondary | ICD-10-CM | POA: Diagnosis present

## 2015-07-01 DIAGNOSIS — E785 Hyperlipidemia, unspecified: Secondary | ICD-10-CM | POA: Diagnosis present

## 2015-07-01 DIAGNOSIS — I878 Other specified disorders of veins: Secondary | ICD-10-CM | POA: Diagnosis present

## 2015-07-01 DIAGNOSIS — Z79899 Other long term (current) drug therapy: Secondary | ICD-10-CM | POA: Diagnosis not present

## 2015-07-01 DIAGNOSIS — Z7901 Long term (current) use of anticoagulants: Secondary | ICD-10-CM

## 2015-07-01 DIAGNOSIS — I4891 Unspecified atrial fibrillation: Secondary | ICD-10-CM | POA: Diagnosis present

## 2015-07-01 LAB — CBC WITH DIFFERENTIAL/PLATELET
Basophils Absolute: 0 10*3/uL (ref 0.0–0.1)
Basophils Relative: 0 %
EOS PCT: 1 %
Eosinophils Absolute: 0.1 10*3/uL (ref 0.0–0.7)
HCT: 26.2 % — ABNORMAL LOW (ref 36.0–46.0)
HEMOGLOBIN: 8.5 g/dL — AB (ref 12.0–15.0)
LYMPHS ABS: 0.9 10*3/uL (ref 0.7–4.0)
LYMPHS PCT: 9 %
MCH: 31 pg (ref 26.0–34.0)
MCHC: 32.4 g/dL (ref 30.0–36.0)
MCV: 95.6 fL (ref 78.0–100.0)
MONOS PCT: 3 %
Monocytes Absolute: 0.3 10*3/uL (ref 0.1–1.0)
NEUTROS PCT: 87 %
Neutro Abs: 8.6 10*3/uL — ABNORMAL HIGH (ref 1.7–7.7)
Platelets: 300 10*3/uL (ref 150–400)
RBC: 2.74 MIL/uL — AB (ref 3.87–5.11)
RDW: 18 % — ABNORMAL HIGH (ref 11.5–15.5)
WBC: 10 10*3/uL (ref 4.0–10.5)

## 2015-07-01 LAB — BASIC METABOLIC PANEL
ANION GAP: 13 (ref 5–15)
BUN: 153 mg/dL — ABNORMAL HIGH (ref 6–20)
CO2: 24 mmol/L (ref 22–32)
Calcium: 8.6 mg/dL — ABNORMAL LOW (ref 8.9–10.3)
Chloride: 93 mmol/L — ABNORMAL LOW (ref 101–111)
Creatinine, Ser: 5.81 mg/dL — ABNORMAL HIGH (ref 0.44–1.00)
GFR calc Af Amer: 7 mL/min — ABNORMAL LOW (ref >60)
GFR calc non Af Amer: 6 mL/min — ABNORMAL LOW (ref >60)
GLUCOSE: 135 mg/dL — AB (ref 65–99)
POTASSIUM: 5.5 mmol/L — AB (ref 3.5–5.1)
Sodium: 130 mmol/L — ABNORMAL LOW (ref 135–145)

## 2015-07-01 LAB — POC OCCULT BLOOD, ED: Fecal Occult Bld: NEGATIVE

## 2015-07-01 LAB — BRAIN NATRIURETIC PEPTIDE: B Natriuretic Peptide: >4500 pg/mL — ABNORMAL HIGH (ref 0.0–100.0)

## 2015-07-01 MED ORDER — SODIUM CHLORIDE 0.9 % IV SOLN: 250.0000 mL | INTRAVENOUS | Status: DC | PRN

## 2015-07-01 MED ORDER — OXYCODONE-ACETAMINOPHEN 5-325 MG PO TABS
1.0000 | ORAL_TABLET | ORAL | Status: DC | PRN
  Administered 2015-07-01: 1 via ORAL
  Filled 2015-07-01: qty 2

## 2015-07-01 MED ORDER — ACETAMINOPHEN 325 MG PO TABS: 650.0000 mg | ORAL_TABLET | ORAL | Status: DC | PRN

## 2015-07-01 MED ORDER — FUROSEMIDE 10 MG/ML IJ SOLN: 80.0000 mg | Freq: Once | INTRAMUSCULAR | Status: DC

## 2015-07-01 MED ORDER — FENTANYL CITRATE (PF) 100 MCG/2ML IJ SOLN
25.0000 ug | INTRAMUSCULAR | Status: DC | PRN
  Administered 2015-07-01: 25 ug via INTRAVENOUS
  Filled 2015-07-01: qty 2

## 2015-07-01 MED ORDER — DIPHENHYDRAMINE HCL 25 MG PO CAPS
25.0000 mg | ORAL_CAPSULE | Freq: Every evening | ORAL | Status: DC | PRN
  Filled 2015-07-01: qty 1

## 2015-07-01 MED ORDER — MORPHINE SULFATE (PF) 4 MG/ML IV SOLN
4.0000 mg | Freq: Once | INTRAVENOUS | Status: AC
  Administered 2015-07-01: 4 mg via INTRAVENOUS
  Filled 2015-07-01: qty 1

## 2015-07-01 MED ORDER — OXYBUTYNIN CHLORIDE ER 10 MG PO TB24
10.0000 mg | ORAL_TABLET | Freq: Every morning | ORAL | Status: DC
  Administered 2015-07-01: 10 mg via ORAL
  Filled 2015-07-01: qty 1

## 2015-07-01 MED ORDER — SODIUM CHLORIDE 0.9% FLUSH
3.0000 mL | INTRAVENOUS | Status: DC | PRN
Start: 2015-07-01 — End: 2015-07-02

## 2015-07-01 MED ORDER — AMIODARONE HCL 200 MG PO TABS
200.0000 mg | ORAL_TABLET | Freq: Two times a day (BID) | ORAL | Status: DC
  Administered 2015-07-01: 200 mg via ORAL
  Filled 2015-07-01: qty 1

## 2015-07-01 MED ORDER — PANTOPRAZOLE SODIUM 40 MG PO TBEC
40.0000 mg | DELAYED_RELEASE_TABLET | Freq: Every day | ORAL | Status: DC
Start: 2015-07-01 — End: 2015-07-02
  Administered 2015-07-01: 40 mg via ORAL
  Filled 2015-07-01: qty 1

## 2015-07-01 MED ORDER — APIXABAN 2.5 MG PO TABS
2.5000 mg | ORAL_TABLET | Freq: Two times a day (BID) | ORAL | Status: DC
  Administered 2015-07-01: 2.5 mg via ORAL
  Filled 2015-07-01: qty 1

## 2015-07-01 MED ORDER — ALLOPURINOL 100 MG PO TABS
100.0000 mg | ORAL_TABLET | Freq: Every day | ORAL | Status: DC
  Administered 2015-07-01: 100 mg via ORAL
  Filled 2015-07-01: qty 1

## 2015-07-01 MED ORDER — SODIUM CHLORIDE 0.9% FLUSH: 10.0000 mL | INTRAVENOUS | Status: DC | PRN

## 2015-07-01 MED ORDER — MORPHINE SULFATE (PF) 2 MG/ML IV SOLN
2.0000 mg | Freq: Once | INTRAVENOUS | Status: AC
  Administered 2015-07-01: 2 mg via INTRAVENOUS
  Filled 2015-07-01: qty 1

## 2015-07-01 MED ORDER — SODIUM CHLORIDE 0.9% FLUSH
3.0000 mL | Freq: Two times a day (BID) | INTRAVENOUS | Status: DC
  Administered 2015-07-01: 3 mL via INTRAVENOUS

## 2015-07-01 MED ORDER — FUROSEMIDE 10 MG/ML IJ SOLN
120.0000 mg | Freq: Two times a day (BID) | INTRAVENOUS | Status: DC
  Administered 2015-07-01 (×2): 120 mg via INTRAVENOUS
  Filled 2015-07-01 (×3): qty 12

## 2015-07-01 MED ORDER — DEXTROSE 5 % IV SOLN
1.0000 g | INTRAVENOUS | Status: DC
  Administered 2015-07-01: 1 g via INTRAVENOUS
  Filled 2015-07-01: qty 10

## 2015-07-01 MED ORDER — ONDANSETRON HCL 4 MG/2ML IJ SOLN
4.0000 mg | Freq: Four times a day (QID) | INTRAMUSCULAR | Status: DC | PRN
  Administered 2015-07-01: 4 mg via INTRAVENOUS
  Filled 2015-07-01: qty 2

## 2015-07-01 MED ORDER — POLYETHYLENE GLYCOL 3350 17 G PO PACK: 17.0000 g | PACK | Freq: Every day | ORAL | Status: DC | PRN

## 2015-07-01 MED ORDER — FENTANYL CITRATE (PF) 100 MCG/2ML IJ SOLN
50.0000 ug | INTRAMUSCULAR | Status: DC | PRN
  Administered 2015-07-01 (×2): 50 ug via INTRAVENOUS
  Filled 2015-07-01 (×2): qty 2

## 2015-07-01 MED ORDER — MILRINONE IN DEXTROSE 20 MG/100ML IV SOLN
0.2000 ug/kg/min | INTRAVENOUS | Status: DC
  Administered 2015-07-01: 0.2 ug/kg/min via INTRAVENOUS
  Filled 2015-07-01: qty 100

## 2015-07-01 MED ORDER — COLLAGENASE 250 UNIT/GM EX OINT
TOPICAL_OINTMENT | Freq: Every day | CUTANEOUS | Status: DC
Start: 2015-07-01 — End: 2015-07-02
  Administered 2015-07-01: 13:00:00 via TOPICAL
  Filled 2015-07-01: qty 30

## 2015-07-02 MED FILL — Medication: Qty: 1 | Status: AC

## 2015-07-04 ENCOUNTER — Encounter (HOSPITAL_COMMUNITY): Payer: Medicare Other

## 2015-07-04 NOTE — Code Documentation (Signed)
CODE BLUE NOTE  Patient Name: Leah Kramer   MRN: [id number]   Date of Birth/ Sex: Dec 10, 1923 , female      Admission Date: 2015/07/06  Attending Provider: Jolaine Artist, MD  Primary Diagnosis: Acute on chronic systolic congestive heart failure [facility])    Indication: Pt was in her usual state of health until this PM, when she was noted to be in asystole. Code blue was subsequently called. At the time of arrival on scene, ACLS protocol was underway.    Technical Description:  - CPR performance duration:  20  minutes  - Was defibrillation or cardioversion used? No   - Was external pacer placed? No  - Was patient intubated pre/post CPR? Yes    Medications Administered: Y = Yes; Blank = No Amiodarone    Atropine    Calcium  Y  Epinephrine  Y  Lidocaine    Magnesium    Norepinephrine    Phenylephrine    Sodium bicarbonate    Vasopressin      Post CPR evaluation:  - Final Status - Was patient successfully resuscitated ? No CPR stopped by Daughters request (DNR) - What is current rhythm? Asystole - What is current hemodynamic status? Unstable (No reliable BP)   Miscellaneous Information:  - Labs sent, including: BMP, ABG, Mg, EKG, CXR ordered but not collected  - Primary team notified?  Yes  - Family Notified? Yes  - Additional notes/ transfer status: CPR ceased by daughter's request. Patient with agonal breathing and faint pulse. On milrinone drip        Mercy Riding, MD  06-Jul-2015, 7:42 PM

## 2015-07-04 NOTE — ED Notes (Signed)
Brought via EMS from home with c/o bil leg pain.  Has been having weeping of legs for a month.  Saw family physician on Wednesday and was started on lasix.  Has been wrapping a dressing on legs.  Dressings saturated in triage.  Legs noted to be reddened and excoriated in places.  Also noted small blister to second toe on left foot.  Pt is on a milronone drip.  Picc line noted to Right upper arm.

## 2015-07-04 NOTE — Anesthesia Postprocedure Evaluation (Signed)
Anesthesia Post Note  Patient: Leah Kramer  Procedure(s) Performed: * No procedures listed *  Patient location during evaluation: Nursing Unit Anesthesia Type: General Level of consciousness: patient remains intubated per anesthesia plan Vital Signs Assessment: post-procedure vital signs reviewed and stable Respiratory status: patient remains intubated per anesthesia plan    Last Vitals:  Filed Vitals:   07/21/2015 1213 21-Jul-2015 1408  BP: 107/42 100/47  Pulse: 64 62  Temp:    Resp: 20 18    Last Pain:  Filed Vitals:   21-Jul-2015 1420  PainSc: Asleep                 Evlyn Amason

## 2015-07-04 NOTE — ED Notes (Addendum)
MD at bedside. Cardiology 

## 2015-07-04 NOTE — Consult Note (Signed)
[name] Reason for Consult: LE wounds Patient end stage heart failure with cardiorenal syndrome. LE cellulitis with open ulcers Wound type: venous stasis and cellulitis  Pressure Ulcer POA: No Measurement:  Right medial: 10cm x 4cm x 0.1cm; 100% pink, moist, weeping serous Right achilles: 2cm x 2cm x 0.3cm; 90% yellow/10% black with exposed tendon centrally Left medial:  3cm x 3cm x 0.1cm; 100% pink, moist, weeping serous Left medial malleolus: 0.5cm x 0.5cm x 0.1cm;  100% yellow   Wound bed: see above Drainage (amount, consistency, odor) see above Periwound: redness, consistent with cellulitis.  Dressing procedure/placement/frequency: Will add enzymatic debridement ointment to the right achilles due to necrotic tissue.  Will notified Dr. Haroldine Laws of the exposed tendon in the ulcer.  Non adherent for the other ulcers, ACE wraps for edema.  I do not think that patient can tolerate compression wraps and she does not significant edema.  Discussed POC with patient and bedside nurse.  Re consult if needed, will not follow at this time. Thanks  Leah Kramer, [place] [phone])

## 2015-07-04 NOTE — Transfer of Care (Signed)
Immediate Anesthesia Transfer of Care Note  Patient: Leah Kramer  Procedure(s) Performed: * No procedures listed *  Patient Location: Nursing Unit  Anesthesia Type:General  Level of Consciousness: unresponsive  Airway & Oxygen Therapy: Patient remains intubated per anesthesia plan and Patient placed on Ventilator (see vital sign flow sheet for setting)  Post-op Assessment: Report given to RN and Post -op Vital signs reviewed and stable  Post vital signs: Reviewed  Last Vitals:  Filed Vitals:   28-Jul-2015 1213 07-28-15 1408  BP: 107/42 100/47  Pulse: 64 62  Temp:    Resp: 20 18    Complications: No apparent anesthesia complications

## 2015-07-04 NOTE — Anesthesia Procedure Notes (Signed)
Procedure Name: Intubation Date/Time: 30-Jul-2015 7:05 PM Performed by: Maude Leriche D Pre-anesthesia Checklist: Patient identified, Emergency Drugs available, Suction available, Patient being monitored and Timeout performed Patient Re-evaluated:Patient Re-evaluated prior to inductionOxygen Delivery Method: Ambu bag Preoxygenation: Pre-oxygenation with 100% oxygen (upper and lower dentures removed and given to RN) Ventilation: Mask ventilation without difficulty Laryngoscope Size: Glidescope Grade View: Grade I Tube type: Subglottic suction tube Number of attempts: 1 Airway Equipment and Method: Stylet and Video-laryngoscopy Placement Confirmation: ETT inserted through vocal cords under direct vision,  CO2 detector and breath sounds checked- equal and bilateral Secured at: 21 cm Tube secured with: Tape Dental Injury: Teeth and Oropharynx as per pre-operative assessment

## 2015-07-04 NOTE — H&P (Addendum)
Advanced Heart Failure Team History and Physical Note   Primary Physician: Dr Heber [place] Primary Cardiologist: Dr Haroldine Laws   Reason for Admission: Recurrent HF, renal failure and leg swelling   HPI:    Leah Kramer is an 82 year old female with history of systolic heart failure, chronic kidney disease, hypertension, diabetes, CAD.  She presented to [facility] in October 2015 with late STEMI. Treated medically. EF 20% and severe MR.   TTE 03/2014 [facility]): Severe LV dysfunction (EF 20%) with basal inferior, basal anterospetal, and basal anterior hypokinesis, dilated LV, diastolic dysfunction, elevated LV filling pressures, degen MV with severe MR, dilated LA, moderate AR, severe pulmonary hypertension, normal RV function, mild TR, elevated CVP  Admitted 11/29 through 05/11/2015 with cellulitis and volume overload. Started was diuresed with IV lasix and later milrinone 0.25 mcg was added due to worsening renal function and low output heart failure. Due to PSVT milrinone was stopped. Unfortunately mixed venous saturations dropped so milrinone was restarted at 0.125 mcg and she was started on amio 200 mg twice a day. Mixed venous saturation improved on milrinone 0.125 mcg. She maintained NSR on lower dose of milrinone and with addition of amiodarone. Treated with vanc and to kelfex to complete 12 day course. Discharge weight was 113 pounds.  .  Seen in HF Clinic 06/27/15 with worsening HF and renal failure despite home IV lasix. Diuretics adjusted. Referred to Hospice. Since that time volume overload has gotten worse. Presents to ER with hypoxia, severe bilateral leg edema and pain with weeping ulcers. Creatinine 5.9 with K 5.5. Also was hypoxic with sats in the 80s. Denies CP. +orthopnea and PND.   ECHO 09/06/2014: EF 20% Grade II DD RV normal.   SH: Lives at home with her husband. Does smoke or drink alcohol.  FH: Mother had HF died in her 73s   Review of Systems: [y] = yes, [ ]  = no   General: Weight  gain [ y]; Weight loss [ ] ; Anorexia Blue.Reese ]; Fatigue [ y]; Fever [ ] ; Chills [ ] ; Weakness [Y]  Cardiac: Chest pain/pressure [ ] ; Resting SOB [ ] ; Exertional SOB [Y ]; Orthopnea [Y ]; Pedal Edema [Y ]; Palpitations [ ] ; Syncope [ ] ; Presyncope [ ] ; Paroxysmal nocturnal dyspnea[ ]   Pulmonary: Cough [ ] ; Wheezing[ ] ; Hemoptysis[ ] ; Sputum [ ] ; Snoring [ ]   GI: Vomiting[ ] ; Dysphagia[ ] ; Melena[ ] ; Hematochezia [ ] ; Heartburn[ ] ; Abdominal pain [ ] ; Constipation [ ] ; Diarrhea [ ] ; BRBPR [ ]   GU: Hematuria[ ] ; Dysuria [ ] ; Nocturia[ ]   Vascular: Pain in legs with walking [ ] ; Pain in feet with lying flat [ ] ; Non-healing sores [ ] ; Stroke [ ] ; TIA [ ] ; Slurred speech [ ] ;  Neuro: Headaches[ ] ; Vertigo[ ] ; Seizures[ ] ; Paresthesias[ ] ;Blurred vision [ ] ; Diplopia [ ] ; Vision changes [ ]   Ortho/Skin: Arthritis [ y]; Joint pain [ y]; Muscle pain [ ] ; Joint swelling [ ] ; Back Pain [ ] ; Rash [Y ]  Psych: Depression[ ] ; Anxiety[ ]   Heme: Bleeding problems [ ] ; Clotting disorders [ ] ; Anemia [ ]   Endocrine: Diabetes [ Y]; Thyroid dysfunction[ ]   Home Medications Prior to Admission medications   Medication Sig Start Date End Date Taking? Authorizing Provider  acetaminophen (TYLENOL) 325 MG tablet Take 650 mg by mouth every 6 (six) hours as needed for mild pain.    Historical Provider, MD  apixaban (ELIQUIS) 2.5 MG TABS tablet Take 1 tablet (2.5 mg total) by mouth 2 (  two) times daily. 04/17/15   Jolaine Artist, MD  calcium carbonate (TUMS - DOSED IN MG ELEMENTAL CALCIUM) 500 MG chewable tablet Chew 1 tablet by mouth 3 (three) times daily as needed for indigestion or heartburn.    Historical Provider, MD  cholecalciferol (VITAMIN D) 400 UNITS TABS tablet Take 400 Units by mouth daily.    Historical Provider, MD  cyanocobalamin (,VITAMIN B-12,) 1000 MCG/ML injection Inject 1,000 mcg into the muscle every 30 (thirty) days. Takes on the 20th of every month    Historical Provider, MD  diclofenac sodium  (VOLTAREN) 1 % GEL Apply 4 g topically 4 (four) times daily. 11/29/14   Max T Hyatt, DPM  metolazone (ZAROXOLYN) 2.5 MG tablet Take 1 tablet every Friday. 04/24/15   [name] D Ninfa Meeker, NP  mupirocin ointment (BACTROBAN) 2 % Apply to wound twice a day. 02/21/15   Max T Hyatt, DPM  nitroGLYCERIN (NITROSTAT) 0.4 MG SL tablet Place 1 tablet (0.4 mg total) under the tongue every 5 (five) minutes as needed for chest pain. Patient not taking: Reported on 04/24/2015 03/05/15   Larey Dresser, MD  omeprazole (PRILOSEC) 20 MG capsule Take 20 mg by mouth daily.     Historical Provider, MD  oxybutynin (DITROPAN-XL) 10 MG 24 hr tablet Take 1 tablet (10 mg total) by mouth every morning. 07/03/14   Jolaine Artist, MD  polyethylene glycol (MIRALAX / GLYCOLAX) packet Take 17 g by mouth daily as needed for mild constipation.     Historical Provider, MD  potassium chloride SA (K-DUR,KLOR-CON) 20 MEQ tablet Take 1 tablet (20 mEq total) by mouth daily. Take additional 60meq when you take Metolazone 04/17/15   [name] D Clegg, NP  simvastatin (ZOCOR) 20 MG tablet Take 1 tablet (20 mg total) by mouth daily. 07/03/14   Jolaine Artist, MD  torsemide (DEMADEX) 20 MG tablet Take 2 tablets (40 mg total) by mouth 2 (two) times daily. 04/24/15   [name] D Ninfa Meeker, NP  vitamin E 400 UNIT capsule Take 400 Units by mouth daily.    Historical Provider, MD    Past Medical History: Past Medical History  Diagnosis Date  . CKD (chronic kidney disease)   . CHF (congestive heart failure) ([place])   . STEMI (ST elevation myocardial infarction) ([place])   . Hypertension   . Diabetes mellitus without complication ([place])   . Difficulty walking   . Overactive bladder   . Hyperlipidemia     Past Surgical History: Past Surgical History  Procedure Laterality Date  . Tonsillectomy      Family History: No family history on file.  Social History: Social History   Social History  . Marital Status: Married    Spouse Name: N/A  . Number of  Children: N/A  . Years of Education: N/A   Social History Main Topics  . Smoking status: Never Smoker   . Smokeless tobacco: Never Used  . Alcohol Use: No  . Drug Use: No  . Sexual Activity: Not Asked   Other Topics Concern  . None   Social History Narrative    Allergies:  Allergies  Allergen Reactions  . Lipitor [Atorvastatin] Other (See Comments)    Leg cramping  . Sulfa Antibiotics Other (See Comments)    Eye swelling    Objective:    Vital Signs:   Pulse Rate:  [55-60] 59 (01/29 0745) Resp:  [16-25] 25 (01/29 0745) BP: (91-102)/(36-46) 100/36 mmHg (01/29 0745) SpO2:  [100 %] 100 % (01/29 0745)  Weight:  [52.164 kg (115 lb)] 52.164 kg (115 lb) (01/29 0514)   Filed Weights   07/23/15 0514  Weight: 52.164 kg (115 lb)    Physical Exam: General:  Elderly Chronically ill appearing. No resp difficulty  C/o leg pain  HEENT: normal Neck: supple. JVP to jaw . Carotids 2+ bilat; no bruits. No lymphadenopathy or thryomegaly appreciated. Cor: PMI laterally displaced. Regular rate & rhythm. +s3 2/6 MR Lungs: clear Abdomen: soft, nontender, + distended. No hepatosplenomegaly. No bruits or masses. Good bowel sounds. Extremities: no cyanosis, clubbing, rash, diffuse BLE erythmea witherous exudate. R and LLE with 3+ weeping edema.  Neuro: alert & orientedx3, cranial nerves grossly intact. moves all 4 extremities w/o difficulty. Affect pleasant  Telemetry: Sinus Rhythm 60s  Labs: Basic Metabolic Panel:  Recent Labs Lab 07/23/15 0530  NA 130*  K 5.5*  CL 93*  CO2 24  GLUCOSE 135*  BUN 153*  CREATININE 5.81*  CALCIUM 8.6*    Liver Function Tests: No results for input(s): AST, ALT, ALKPHOS, BILITOT, PROT, ALBUMIN in the last 168 hours. No results for input(s): LIPASE, AMYLASE in the last 168 hours. No results for input(s): AMMONIA in the last 168 hours.  CBC:  Recent Labs Lab Jul 23, 2015 0530  WBC 10.0  NEUTROABS 8.6*  HGB 8.5*  HCT 26.2*  MCV 95.6  PLT 300     Cardiac Enzymes: No results for input(s): CKTOTAL, CKMB, CKMBINDEX, TROPONINI in the last 168 hours.  BNP: BNP (last 3 results)  Recent Labs  July 23, 2015 0530  BNP >4500.0*    ProBNP (last 3 results) No results for input(s): PROBNP in the last 8760 hours.   CBG: No results for input(s): GLUCAP in the last 168 hours.  Coagulation Studies: No results for input(s): LABPROT, INR in the last 72 hours.  Other results: EKG: NSR 60 RBBB   Imaging: Dg Chest 2 View  2015-07-23  CLINICAL DATA:  Acute onset of shortness of breath and bilateral lower extremity swelling. Initial encounter. EXAM: CHEST  2 VIEW COMPARISON:  Chest radiograph performed 05/02/2015 FINDINGS: Small bilateral pleural effusions are noted. Bibasilar airspace opacity may reflect mild interstitial edema. Underlying vascular congestion is noted. No pneumothorax is seen. The cardiomediastinal silhouette is enlarged. A right PICC is noted ending about the cavoatrial junction. Chronic compression deformities are noted along the lower thoracic and upper lumbar spine. IMPRESSION: Small bilateral pleural effusions noted. Vascular congestion and cardiomegaly. Bibasilar airspace opacity may reflect mild interstitial edema. Electronically Signed   By: Garald Balding M.D.   On: Jul 23, 2015 05:51        Assessment/Plan    1. Acute/Chronic Systolic Heart Failure: Ischemic cardiomyopathy, EF 20% ECHO 4/16. On home milrinone 2. Acute on chronic renal failure -stage IV - Suspect cardiorenal syndrome.  3.  Severe LE cellulitis and edema with skin breakdown 4. Paroxysmal atrial fibrillation: In NSR on amio 5. Hyperkalemia 6. CAD: S/P STEMI at [facility] in 10/15. 7. Acute hypoxic respiratory failure 8. DNR/DNI   Plan/Discussion:    She is actively dying from end-stage HF failure with cardiorenal syndrome despite milrinone and aggressive home diuretic regimen. She also has recurrent LE cellulitis. I spoke with her daughter at length  and suggested comfort care with inpatient hospice but she is not ready for this. Wants a trial of IV diuresis and antibiotics. I explained that even if she recovers a bit in the hospital she will relapse quickly. She understands but wants to proceed with IV lasix and abx, She remains  DNR/DNI. Will ask wound care to see. No b-blocker or ACE due to renal failure and low output.   Glori Bickers MD 24-Jul-2015, 8:08 AM  Advanced Heart Failure Team Pager [phone] (M-F; [place])  Please contact [facility] for night-coverage after hours (4p -7a ) and weekends on [email or web address]

## 2015-07-04 NOTE — Progress Notes (Signed)
Utilization review completed.  

## 2015-07-04 NOTE — Progress Notes (Signed)
PICC, PIV, foley and tube removed. Patient's hearing aids, dentures and wedding band remain bedside with patient and will be sent to funeral home, no other belongings noted.  Tresa Endo

## 2015-07-04 NOTE — Progress Notes (Signed)
Central tele called with pt in asystole,this nurse went down to room pt lying in bed niece at the bedside. Pt unresponsive Code initiated. Code status was unclear. [name] Daughter notified and wanted everything done. See code sheet for details. Called at [place] at the bedside, [place] donor, [place] and MD called to sign death certificate. Bed placementt made aware.

## 2015-07-04 NOTE — ED Provider Notes (Addendum)
CSN: [id number]     Arrival date & time 05-Jul-2015  0458 History   First MD Initiated Contact with Patient 05-Jul-2015 0510     Chief Complaint  Patient presents with  . Leg Pain     (Consider location/radiation/quality/duration/timing/severity/associated sxs/prior Treatment) HPI Ms. Vater is an 82yo female PMH of CKD, CHF (EF 20% on milrinone via PICC line), HTN, DM, presenting today with BLE pain.  Patient states her legs have been continuously weeping for the past month and this has continued to get worse.  She recently saw her PCP who wrapped the legs, but now the dressing is saturated.  During her last cardiology appointment she was given additional lasix.  Patient was initially 88% on RA per EMS.  She states she currently does not have any CP or SOB.  Patient has no further complaints.  10 Systems reviewed and are negative for acute change except as noted in the HPI.    Past Medical History  Diagnosis Date  . CKD (chronic kidney disease)   . CHF (congestive heart failure) ([place])   . STEMI (ST elevation myocardial infarction) ([place])   . Hypertension   . Diabetes mellitus without complication ([place])   . Difficulty walking   . Overactive bladder   . Hyperlipidemia    Past Surgical History  Procedure Laterality Date  . Tonsillectomy     No family history on file. Social History  Substance Use Topics  . Smoking status: Never Smoker   . Smokeless tobacco: Never Used  . Alcohol Use: No   OB History    No data available     Review of Systems    Allergies  Lipitor and Sulfa antibiotics  Home Medications   Prior to Admission medications   Medication Sig Start Date End Date Taking? Authorizing Provider  acetaminophen (TYLENOL) 325 MG tablet Take 650 mg by mouth every 6 (six) hours as needed for mild pain.    Historical Provider, MD  allopurinol (ZYLOPRIM) 100 MG tablet Take 1 tablet (100 mg total) by mouth daily. 05/11/15   [name] D Ninfa Meeker, NP  amiodarone (PACERONE) 200 MG tablet  Take 1 tablet (200 mg total) by mouth 2 (two) times daily. 05/11/15   [name] D Ninfa Meeker, NP  apixaban (ELIQUIS) 2.5 MG TABS tablet Take 1 tablet (2.5 mg total) by mouth 2 (two) times daily. 04/17/15   Jolaine Artist, MD  calcium carbonate (TUMS - DOSED IN MG ELEMENTAL CALCIUM) 500 MG chewable tablet Chew 1 tablet by mouth 3 (three) times daily as needed for indigestion or heartburn.    Historical Provider, MD  calcium-vitamin D (OSCAL WITH D) 500-200 MG-UNIT tablet Take 1 tablet by mouth daily with breakfast.    Historical Provider, MD  cholecalciferol (VITAMIN D) 400 UNITS TABS tablet Take 400 Units by mouth at bedtime.     Historical Provider, MD  cyanocobalamin (,VITAMIN B-12,) 1000 MCG/ML injection Inject 1,000 mcg into the muscle every 30 (thirty) days. Takes on the 20th of every month    Historical Provider, MD  diphenhydrAMINE (BENADRYL) 25 MG tablet Take 25 mg by mouth at bedtime as needed.    Historical Provider, MD  furosemide (LASIX) 10 MG/ML solution Take 8 mLs (80 mg total) by mouth once as needed. X 2 doses 06/20/15   [name] D Clegg, NP  metolazone (ZAROXOLYN) 2.5 MG tablet Take 1 tablet (2.5 mg total) by mouth as needed. Take as needed for weight 116 pounds. No more than twice a week 05/11/15  [name] D Ninfa Meeker, NP  milrinone (PRIMACOR) 20 MG/100ML SOLN infusion Inject 6.2125 mcg/min into the vein continuous. 05/11/15   [name] D Ninfa Meeker, NP  nitroGLYCERIN (NITROSTAT) 0.4 MG SL tablet Place 1 tablet (0.4 mg total) under the tongue every 5 (five) minutes as needed for chest pain. 03/05/15   Larey Dresser, MD  omeprazole (PRILOSEC) 20 MG capsule Take 20 mg by mouth daily.     Historical Provider, MD  oxybutynin (DITROPAN-XL) 10 MG 24 hr tablet Take 1 tablet (10 mg total) by mouth every morning. 07/03/14   Jolaine Artist, MD  polyethylene glycol (MIRALAX / GLYCOLAX) packet Take 17 g by mouth daily as needed for mild constipation.     Historical Provider, MD  potassium chloride SA (K-DUR,KLOR-CON) 20 MEQ  tablet Take 1 tablet (20 mEq total) by mouth daily. Take additional 78meq when you take Metolazone 04/17/15   [name] D Clegg, NP  simvastatin (ZOCOR) 20 MG tablet Take 1 tablet (20 mg total) by mouth daily. 07/03/14   Jolaine Artist, MD  torsemide (DEMADEX) 20 MG tablet Take 4 tabs (80 mg) in the AM and take 3 tabs (60 mg ) in the PM Patient taking differently: 80 mg 2 (two) times daily. Take 4 tabs (80 mg) twice a day. 05/16/15   [name] D Ninfa Meeker, NP  vitamin E 400 UNIT capsule Take 400 Units by mouth at bedtime.     Historical Provider, MD   BP 102/46 mmHg  Pulse 60  Resp 21  Ht 5\' 2"  (1.575 m)  Wt 115 lb (52.164 kg)  BMI 21.03 kg/m2  SpO2 100%  LMP  (LMP Unknown) Physical Exam  Constitutional: She is oriented to person, place, and time. She appears well-developed and well-nourished. She appears distressed.  HENT:  Head: Normocephalic and atraumatic.  Nose: Nose normal.  Mouth/Throat: Oropharynx is clear and moist. No oropharyngeal exudate.  Nasal cannula in place.  Eyes: Conjunctivae and EOM are normal. Pupils are equal, round, and reactive to light. No scleral icterus.  Neck: Normal range of motion. Neck supple. JVD present. No tracheal deviation present. No thyromegaly present.  Cardiovascular: Normal rate, regular rhythm and normal heart sounds.  Exam reveals no gallop and no friction rub.   No murmur heard. Pulmonary/Chest: Breath sounds normal. She is in respiratory distress. She has no wheezes. She exhibits no tenderness.  Tachypnea, inc use of accessory muscles, inc work of breathing, respiratory distress, crackles heard at bilateral bases  Abdominal: Soft. Bowel sounds are normal. She exhibits no distension and no mass. There is no tenderness. There is no rebound and no guarding.  Musculoskeletal: Normal range of motion. She exhibits no edema or tenderness.  RUE PICC line in place with milrinone drip actively running  Lymphadenopathy:    She has no cervical adenopathy.   Neurological: She is alert and oriented to person, place, and time. No cranial nerve deficit. She exhibits normal muscle tone.  Skin: Skin is warm and dry. No rash noted. No erythema. No pallor.  Nursing note and vitals reviewed.   ED Course  Procedures (including critical care time) Labs Review Labs Reviewed  CBC WITH DIFFERENTIAL/PLATELET - Abnormal; Notable for the following:    RBC 2.74 (*)    Hemoglobin 8.5 (*)    HCT 26.2 (*)    RDW 18.0 (*)    Neutro Abs 8.6 (*)    All other components within normal limits  BRAIN NATRIURETIC PEPTIDE - Abnormal; Notable for the following:  B Natriuretic Peptide >4500.0 (*)    All other components within normal limits  BASIC METABOLIC PANEL - Abnormal; Notable for the following:    Sodium 130 (*)    Potassium 5.5 (*)    Chloride 93 (*)    Glucose, Bld 135 (*)    BUN 153 (*)    Creatinine, Ser 5.81 (*)    Calcium 8.6 (*)    GFR calc non Af Amer 6 (*)    GFR calc Af Amer 7 (*)    All other components within normal limits  POC OCCULT BLOOD, ED    Imaging Review Dg Chest 2 View  July 06, 2015  CLINICAL DATA:  Acute onset of shortness of breath and bilateral lower extremity swelling. Initial encounter. EXAM: CHEST  2 VIEW COMPARISON:  Chest radiograph performed 05/02/2015 FINDINGS: Small bilateral pleural effusions are noted. Bibasilar airspace opacity may reflect mild interstitial edema. Underlying vascular congestion is noted. No pneumothorax is seen. The cardiomediastinal silhouette is enlarged. A right PICC is noted ending about the cavoatrial junction. Chronic compression deformities are noted along the lower thoracic and upper lumbar spine. IMPRESSION: Small bilateral pleural effusions noted. Vascular congestion and cardiomegaly. Bibasilar airspace opacity may reflect mild interstitial edema. Electronically Signed   By: Garald Balding M.D.   On: 07/06/15 05:51   I have personally reviewed and evaluated these images and lab results as  part of my medical decision-making.   EKG Interpretation   Date/Time:  07-06-2015 05:07:27 EST Ventricular Rate:  60 PR Interval:    QRS Duration: 164 QT Interval:  486 QTC Calculation: 486 R Axis:   -60 Text Interpretation:  Junctional rhythm RBBB and LAFB No significant  change since last tracing Confirmed by Glynn Octave [phone]) on  06-Jul-2015 5:48:05 AM      MDM   Final diagnoses:  None   Patient presents to the ED for BLE pain.  Her legs are weeping significantly, I believe due to worsening fluid there.  She denies any CP or SOB.  She was placed on 2L [place] and O2 sat is now 100%.  She normally does not wear O2 at home and will require admission.  BNP is now >4500, Cr is 5.91 with potassium of 5.5.  Patient is in worsening fluid overload.  I will page cardiology for admission.  They recommend to give 80mg  IV lasix.  Everlene Balls, MD 07-06-15 [phone]

## 2015-07-04 NOTE — Progress Notes (Signed)
Chaplain responded to Code [employer]. Chaplain met this family in the ED on another ED case. Patient is a 82 yr old felmale who came to the ED this am, and was later  admitted to 2E01. Her husband of 62 years, and her daughter were her with her.  Chaplain met the family once again at this Code [place].  After extensive medical intervention the Code was stopped after the Dr. Informed the daughter that their efforts with CPR were unsuccessful.  Chaplain offered prayer of peace and comfort for the family, and grief support. They were informed of needed information needed by the Nursing Staff, chaplain escorted the family out of the hospital. Gillie Manners [phone]

## 2015-07-04 NOTE — Progress Notes (Signed)
[facility]  Patient Status: Active pt with [facility] prior to this admission  [facility] is providing the following services: [facility] provides pt's home Milrinone.  Pt has [place] nursing with [facility].  We will follow Ms. Channing while she is an inpatient and provide needed [facility] services upon DC as ordered.   If patient discharges after hours, please call [phone].   Larry Sierras July 14, 2015, 8:42 PM

## 2015-07-04 NOTE — ED Notes (Signed)
Taken to xray at this time. 

## 2015-07-04 DEATH — deceased

## 2015-08-01 NOTE — Discharge Summary (Signed)
  Advanced Heart Failure Team  Discharge/Death Summary   Patient ID: Leah Kramer MRN: [id number], DOB/AGE: 80-Aug-1925 80 y.o. Admit date: 07-04-2015 D/C date:     2015-07-04    Primary Discharge Diagnoses:  1. Acute/Chronic Systolic Heart Failure: Ischemic cardiomyopathy, EF 20% ECHO 4/16. On home milrinone 2. Acute on chronic renal failure -stage IV - Suspect cardiorenal syndrome.  3. Severe LE cellulitis and edema with skin breakdown 4. Paroxysmal atrial fibrillation: In NSR on amio 5. Hyperkalemia 6. CAD: S/P STEMI at [facility] in 10/15. 7. Acute hypoxic respiratory failure 8. DNR/DNI    Hospital Course:   Ms. Brunken is an 82 year old female with history of systolic heart failure, chronic kidney disease, hypertension, diabetes, CAD. In October 2015 she present with late STEMI. Treated medically. EF 20% and severe MR. TTE 03/2014 [facility]): Severe LV dysfunction (EF 20%) with basal inferior, basal anterospetal, and basal anterior hypokinesis, dilated LV, diastolic dysfunction, elevated LV filling pressures, degen MV with severe MR, dilated LA, moderate AR, severe pulmonary hypertension, normal RV function, mild TR, elevated CVP.   Seen in HF Clinic 06/27/15 with worsening HF and renal failure despite home IV lasix. Diuretics adjusted. Made DNR. Referred to Hospice. Since that time volume overload had gotten worse. She presented to the  ER with hypoxia, severe bilateral leg edema and pain with weeping ulcers and cellulitis. Creatinine 5.9 with K 5.5. Also was hypoxic with sats in the 80s. Discussed comfort care with family but decided to proceed with attempts at diuresis and IV abx. She was placed on IV antibiotics for cellulitis and diuresed with IV lasix. Later that day she went into asystole with family at bedside. CPR initiated. Dr. Haroldine Laws was contacted by telephone. He spoke with daughter and discussed futility of ongoing resuscitation. Code status changed to DNR and CPR stopped. She passed with  family at the bedside 07/04/2015.   Duration of Discharge Encounter: Greater than 35 minutes   Signed, [name] Clegg Np-C  07/05/2015, 8:54 PM  Agree.  Shainna Faux,MD 1:50 PM

## 2016-07-23 IMAGING — US US RENAL
1 series · 14 of 25 positions shown · non-contrast
Comparison: None.

CLINICAL DATA: 89-year-old female with renal failure.

EXAM:
RENAL / URINARY TRACT ULTRASOUND COMPLETE

[Series 1: us renal · 0.23mm/px · 14 of 27 slices shown]
[im 1/27]
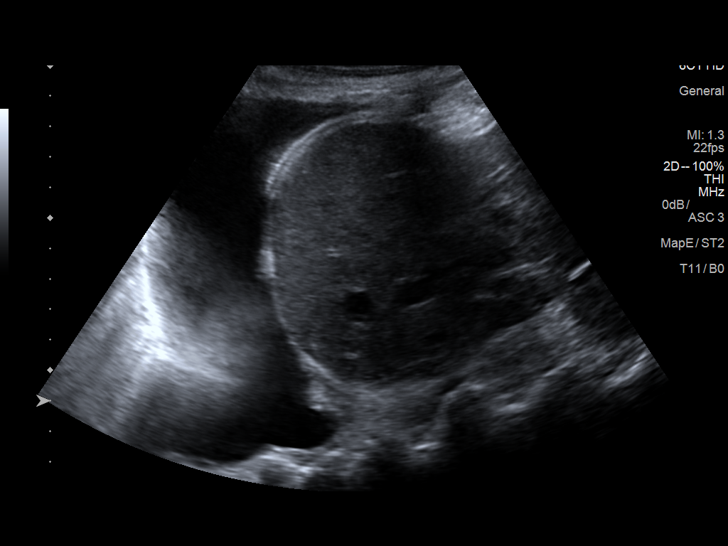
[im 3/27]
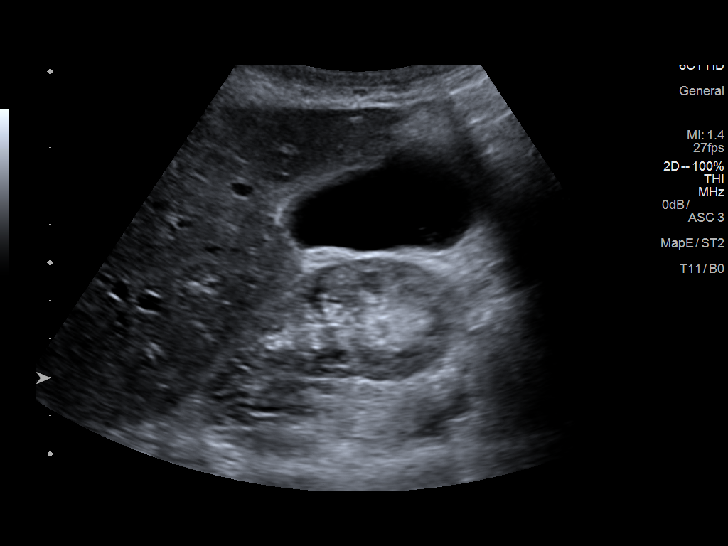
[im 5/27]
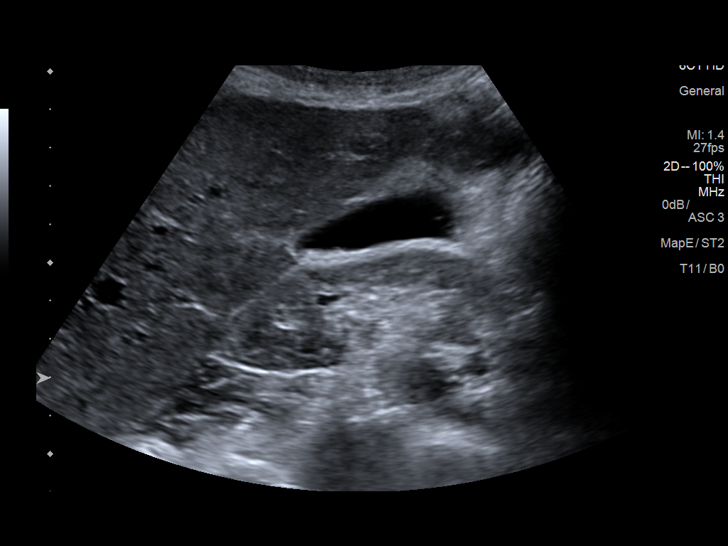
[im 7/27]
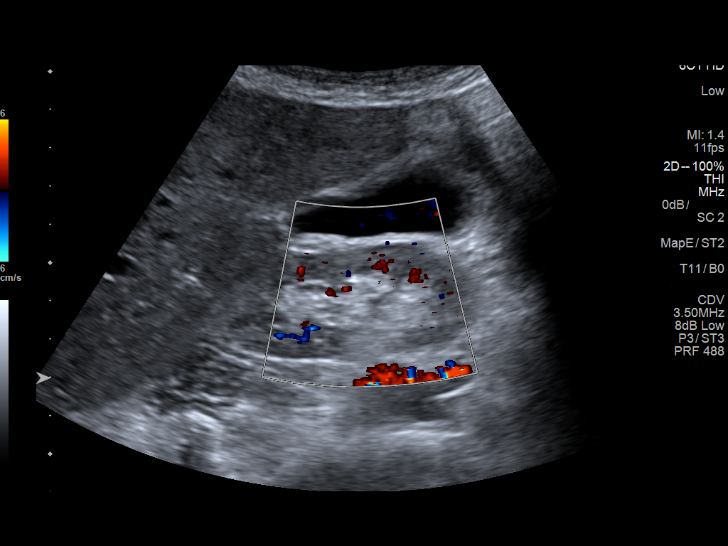
[im 9/27]
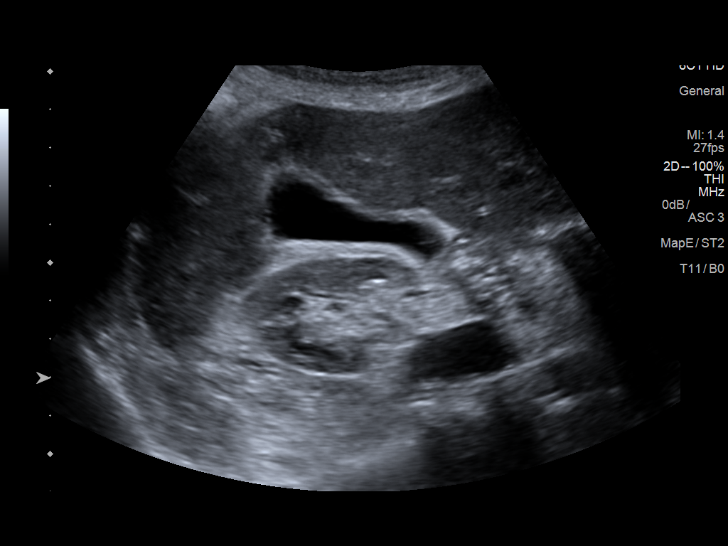
[im 10/27]
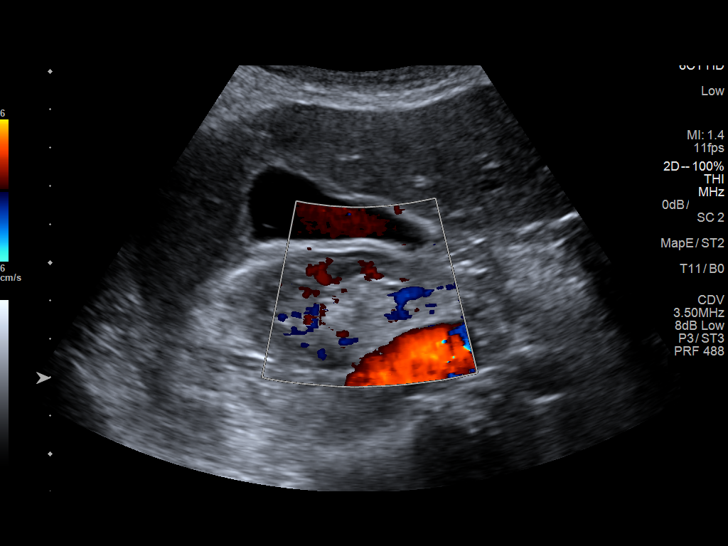
[im 12/27]
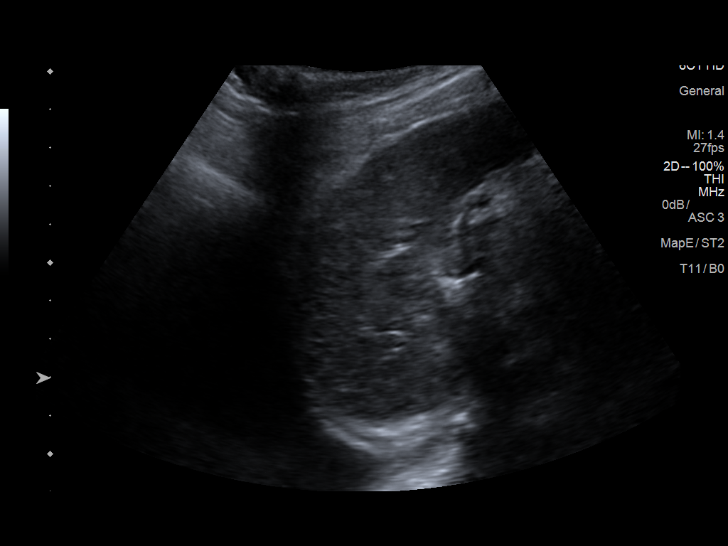
[im 15/27]
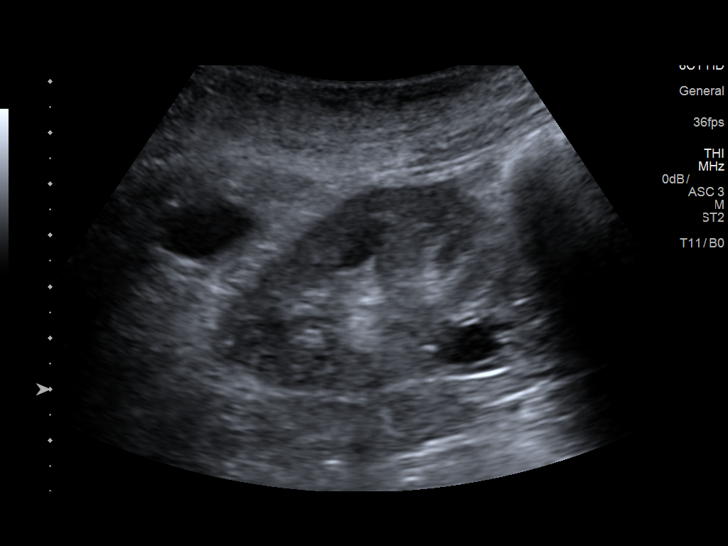
[im 17/27]
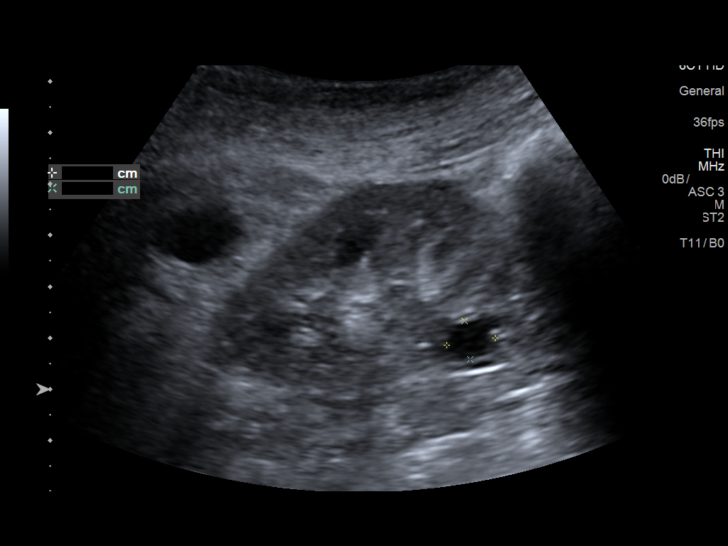
[im 18/27]
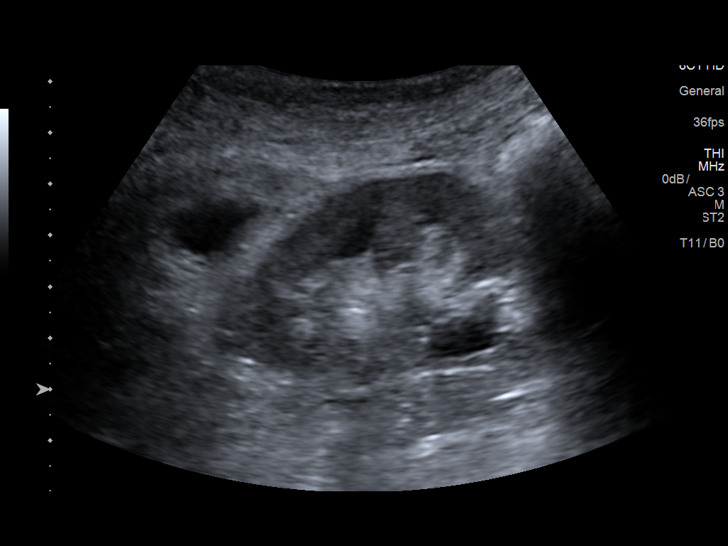
[im 20/27]
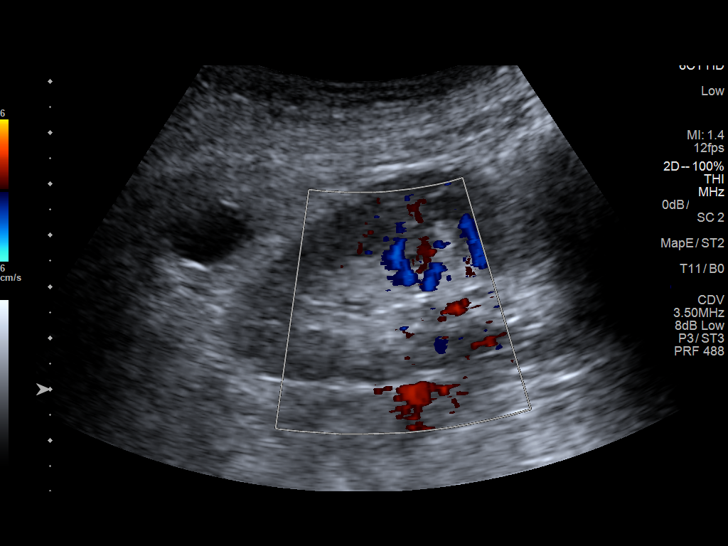
[im 22/27]
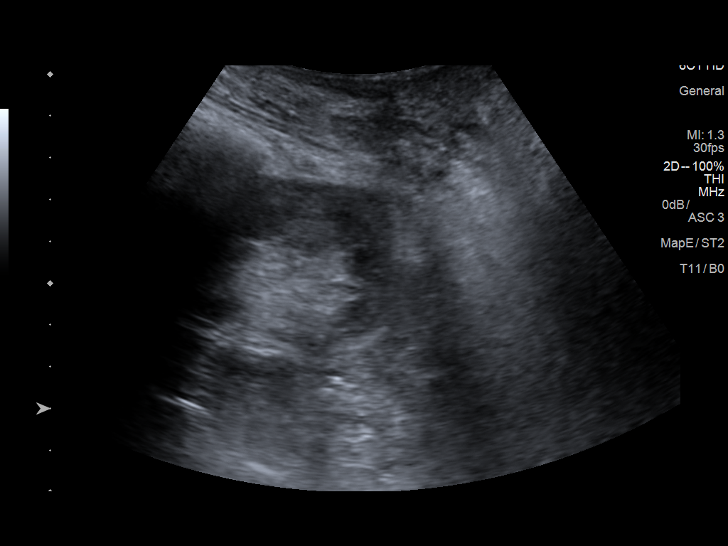
[im 24/27]
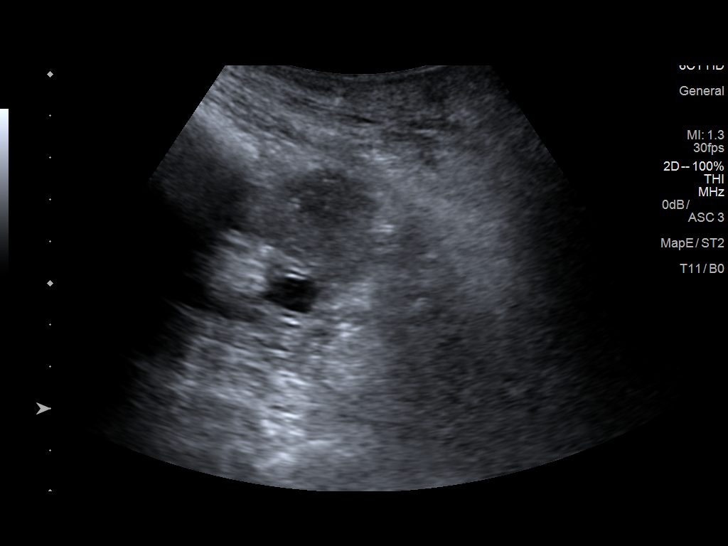
[im 27/27]
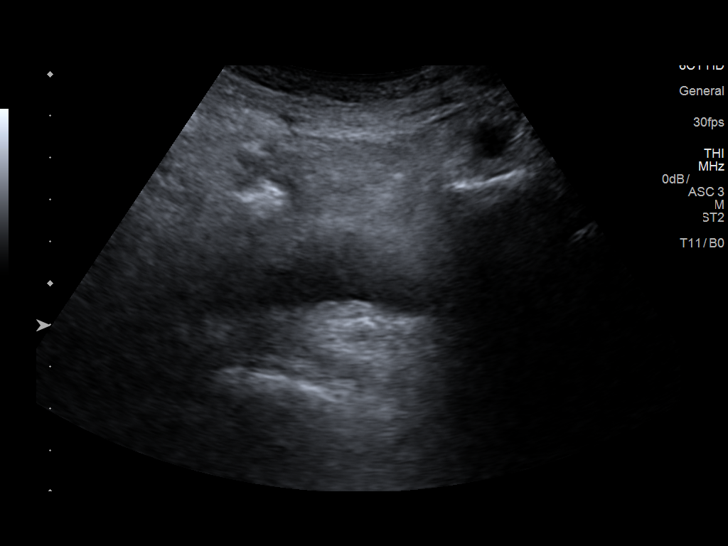

[14 of 25 positions shown; findings below may reference images not displayed]

FINDINGS: Right Kidney:

Length: 7.7 cm. The right kidney appears echogenic. There is mild to
moderate right renal atrophy. There is no hydronephrosis or
echogenic stone.

Left Kidney:

Length: 7.2 cm. There is mild increase echogenicity of the left
kidney. There is a 1.0 x 0.8 x 1.2 cm hypoechoic lesion in the
inferior pole of the left kidney medially, possibly a cyst. There is
no hydronephrosis or echogenic stone.

Bladder:

The gallbladder is predominantly collapsed.

There are bilateral pleural effusions.
IMPRESSION: No hydronephrosis or echogenic stone on either side.

Increased renal echogenicity likely related to underlying medical
renal disease. Clinical correlation is recommended.

Bilateral pleural effusions.

## 2016-09-17 IMAGING — CR DG CHEST 1V PORT
1 series · 1 of 1 positions shown · non-contrast
Comparison: 04/07/2014 and 03/21/2014 radiographs.

CLINICAL DATA: Respiratory distress. Abdominal pain. Initial
encounter.

EXAM:
PORTABLE CHEST - 1 VIEW

[AP]
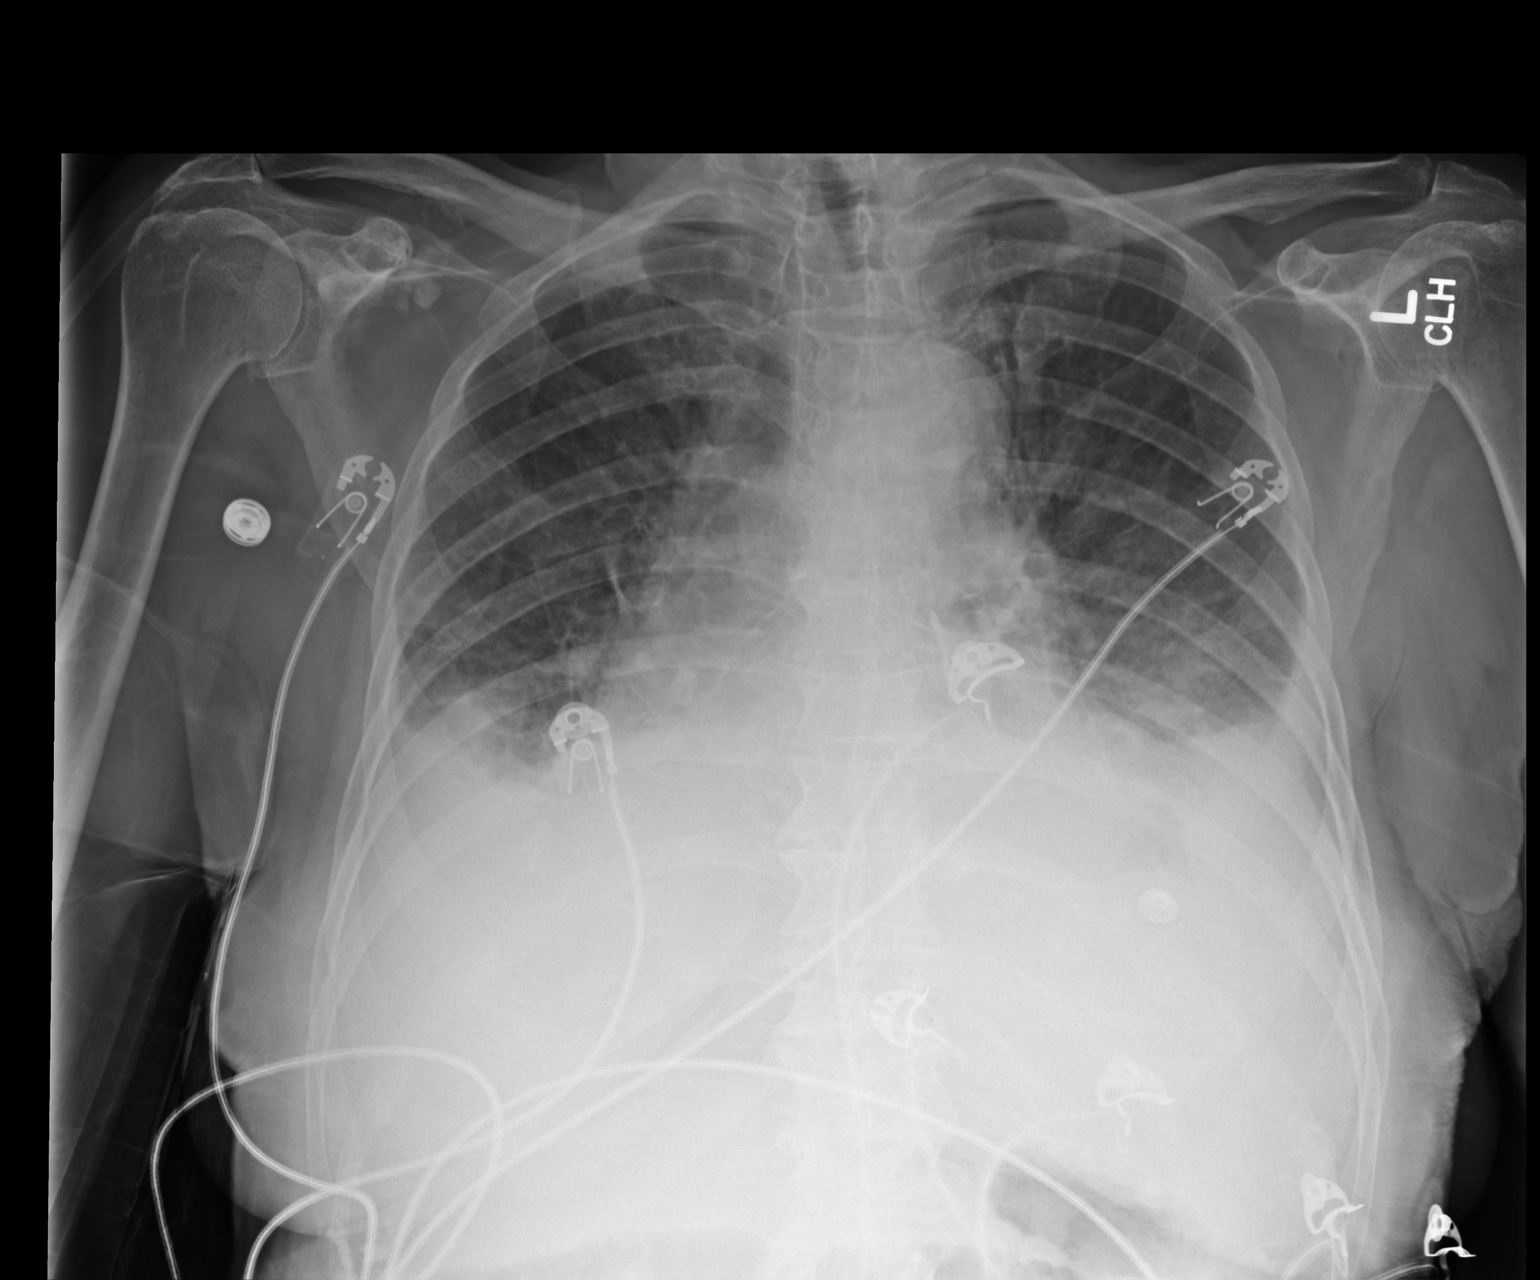

[1 of 1 positions shown; findings below may reference images not displayed]

FINDINGS: 3300 hr. There are new moderate-sized pleural effusions with
associated bibasilar atelectasis and mild pulmonary edema.
Cardiomegaly appears grossly stable. There is stable atherosclerosis
of the thoracic aorta. No pneumothorax or acute osseous findings
demonstrated. There are probable loose bodies in the right shoulder
suprascapular recess.
IMPRESSION: Cardiomegaly, pulmonary edema and moderate bilateral pleural
effusions consistent with congestive heart failure.

## 2016-09-18 IMAGING — CR DG CHEST 1V PORT
1 series · 1 of 1 positions shown · non-contrast
Comparison: 05/06/2014

CLINICAL DATA: PICC line placement

EXAM:
PORTABLE CHEST - 1 VIEW

[AP]
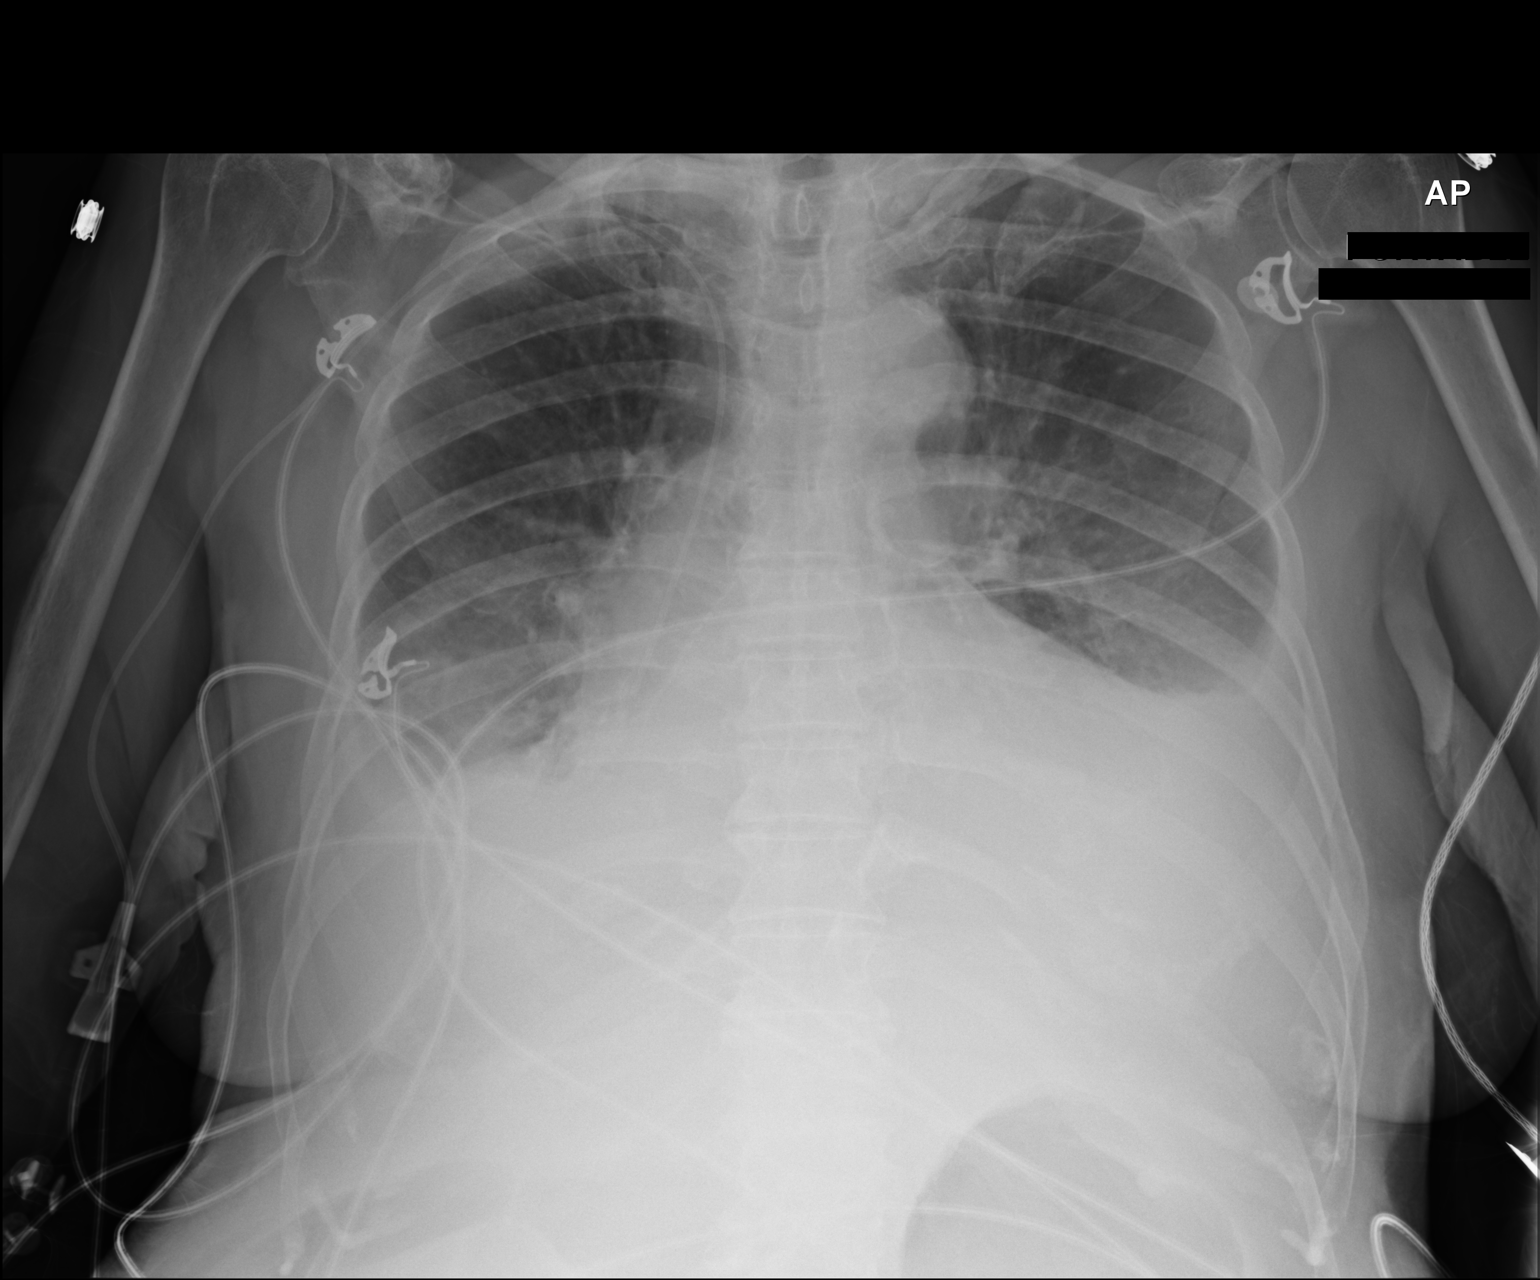

[1 of 1 positions shown; findings below may reference images not displayed]

FINDINGS: Right-sided PICC line with the tip projecting over the right atrium.
Recommend retracting the PICC line 4 cm.

Bilateral small pleural effusions, left greater than right. Mild
bilateral interstitial thickening. No pneumothorax. Stable
cardiomediastinal silhouette. No acute osseous abnormality.
IMPRESSION: 1. Right-sided PICC line with the tip projecting over the right
atrium. Recommend retracting the PICC line 4 cm.
2. Stable CHF.

## 2017-09-13 IMAGING — CR DG CHEST 1V PORT
1 series · 1 of 1 positions shown · non-contrast
Comparison: PA and lateral chest x-ray June 29, 2014

CLINICAL DATA: Acute on chronic CHF, acute respiratory failure,
chronic renal insufficiency, lower extremity cellulitis.

EXAM:
PORTABLE CHEST 1 VIEW

[AP]
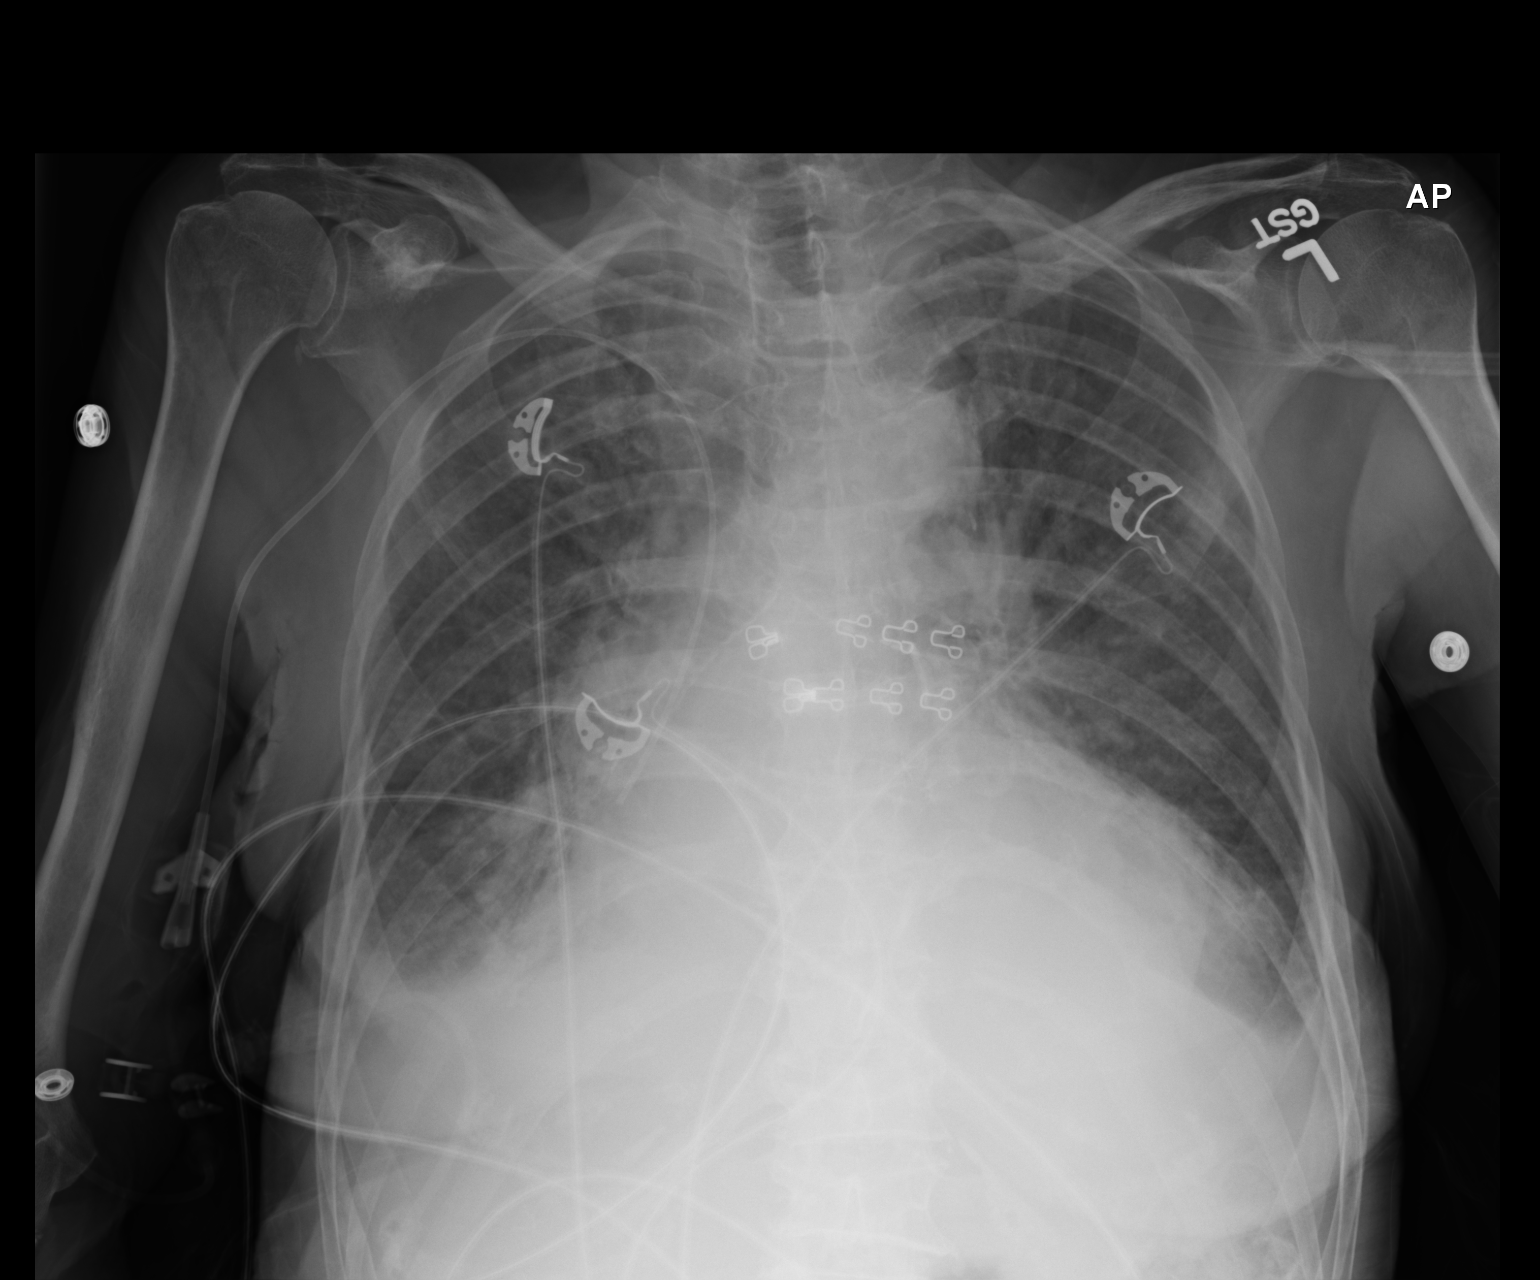

[1 of 1 positions shown; findings below may reference images not displayed]

FINDINGS: The lungs are adequately inflated. Bilateral pleural effusions
persist but are slightly less conspicuous. The pulmonary
interstitial markings remain increased. The cardiac silhouette
remains enlarged and the pulmonary vascularity remains engorged. The
right subclavian venous catheter tip projects over the distal third
of the SVC.
IMPRESSION: Slight interval increase in pulmonary vascular congestion and
interstitial edema consistent with CHF. Persistent cardiomegaly and
small bilateral pleural effusions.
